# Patient Record
Sex: Female | Born: 1950
Health system: Southern US, Community
[De-identification: ages and names within clinical notes are randomized; demographics above are authoritative.]

## PROBLEM LIST (undated history)

## (undated) DIAGNOSIS — R29898 Other symptoms and signs involving the musculoskeletal system: Secondary | ICD-10-CM

## (undated) DIAGNOSIS — E785 Hyperlipidemia, unspecified: Secondary | ICD-10-CM

## (undated) DIAGNOSIS — F39 Unspecified mood [affective] disorder: Secondary | ICD-10-CM

## (undated) DIAGNOSIS — F0390 Unspecified dementia without behavioral disturbance: Secondary | ICD-10-CM

## (undated) DIAGNOSIS — M8580 Other specified disorders of bone density and structure, unspecified site: Secondary | ICD-10-CM

## (undated) DIAGNOSIS — I639 Cerebral infarction, unspecified: Secondary | ICD-10-CM

## (undated) DIAGNOSIS — I10 Essential (primary) hypertension: Secondary | ICD-10-CM

## (undated) DIAGNOSIS — G4700 Insomnia, unspecified: Secondary | ICD-10-CM

## (undated) DIAGNOSIS — D332 Benign neoplasm of brain, unspecified: Secondary | ICD-10-CM

## (undated) HISTORY — PX: OTHER SURGICAL HISTORY: SHX169

## (undated) HISTORY — DX: Insomnia, unspecified: G47.00

## (undated) HISTORY — DX: Cerebral infarction, unspecified: I63.9

## (undated) HISTORY — DX: Hyperlipidemia, unspecified: E78.5

## (undated) HISTORY — DX: Other symptoms and signs involving the musculoskeletal system: R29.898

## (undated) HISTORY — DX: Unspecified dementia, unspecified severity, without behavioral disturbance, psychotic disturbance, mood disturbance, and anxiety: F03.90

## (undated) HISTORY — DX: Other specified disorders of bone density and structure, unspecified site: M85.80

## (undated) HISTORY — DX: Unspecified mood (affective) disorder: F39

---

## 1985-07-22 HISTORY — PX: MELANOMA EXCISION: SHX5266

## 2014-06-29 DIAGNOSIS — D420 Neoplasm of uncertain behavior of cerebral meninges: Secondary | ICD-10-CM | POA: Insufficient documentation

## 2014-07-14 DIAGNOSIS — I10 Essential (primary) hypertension: Secondary | ICD-10-CM | POA: Insufficient documentation

## 2014-07-14 DIAGNOSIS — H9192 Unspecified hearing loss, left ear: Secondary | ICD-10-CM | POA: Insufficient documentation

## 2016-05-02 DIAGNOSIS — I10 Essential (primary) hypertension: Secondary | ICD-10-CM | POA: Diagnosis not present

## 2016-05-02 DIAGNOSIS — Z1389 Encounter for screening for other disorder: Secondary | ICD-10-CM | POA: Diagnosis not present

## 2016-05-02 DIAGNOSIS — Z6822 Body mass index (BMI) 22.0-22.9, adult: Secondary | ICD-10-CM | POA: Diagnosis not present

## 2016-05-02 DIAGNOSIS — F1010 Alcohol abuse, uncomplicated: Secondary | ICD-10-CM | POA: Diagnosis not present

## 2016-05-02 DIAGNOSIS — R42 Dizziness and giddiness: Secondary | ICD-10-CM | POA: Diagnosis not present

## 2016-07-04 ENCOUNTER — Inpatient Hospital Stay (HOSPITAL_COMMUNITY)
Admission: EM | Admit: 2016-07-04 | Discharge: 2016-07-06 | DRG: 640 | Disposition: A | Discharge status: Home Health | Payer: PPO | Attending: Internal Medicine | Admitting: Internal Medicine

## 2016-07-04 ENCOUNTER — Inpatient Hospital Stay (HOSPITAL_COMMUNITY): Payer: PPO

## 2016-07-04 ENCOUNTER — Encounter (HOSPITAL_COMMUNITY): Payer: Self-pay

## 2016-07-04 ENCOUNTER — Emergency Department (HOSPITAL_COMMUNITY): Payer: PPO

## 2016-07-04 DIAGNOSIS — Z86011 Personal history of benign neoplasm of the brain: Secondary | ICD-10-CM | POA: Diagnosis not present

## 2016-07-04 DIAGNOSIS — E871 Hypo-osmolality and hyponatremia: Principal | ICD-10-CM

## 2016-07-04 DIAGNOSIS — R29818 Other symptoms and signs involving the nervous system: Secondary | ICD-10-CM | POA: Diagnosis not present

## 2016-07-04 DIAGNOSIS — F1020 Alcohol dependence, uncomplicated: Secondary | ICD-10-CM | POA: Diagnosis not present

## 2016-07-04 DIAGNOSIS — E876 Hypokalemia: Secondary | ICD-10-CM | POA: Diagnosis not present

## 2016-07-04 DIAGNOSIS — F10939 Alcohol use, unspecified with withdrawal, unspecified: Secondary | ICD-10-CM

## 2016-07-04 DIAGNOSIS — R410 Disorientation, unspecified: Secondary | ICD-10-CM

## 2016-07-04 DIAGNOSIS — T502X5A Adverse effect of carbonic-anhydrase inhibitors, benzothiadiazides and other diuretics, initial encounter: Secondary | ICD-10-CM | POA: Diagnosis not present

## 2016-07-04 DIAGNOSIS — I10 Essential (primary) hypertension: Secondary | ICD-10-CM | POA: Diagnosis not present

## 2016-07-04 DIAGNOSIS — E232 Diabetes insipidus: Secondary | ICD-10-CM

## 2016-07-04 DIAGNOSIS — F05 Delirium due to known physiological condition: Secondary | ICD-10-CM | POA: Diagnosis not present

## 2016-07-04 DIAGNOSIS — F1010 Alcohol abuse, uncomplicated: Secondary | ICD-10-CM

## 2016-07-04 DIAGNOSIS — E785 Hyperlipidemia, unspecified: Secondary | ICD-10-CM | POA: Diagnosis not present

## 2016-07-04 DIAGNOSIS — R402411 Glasgow coma scale score 13-15, in the field [EMT or ambulance]: Secondary | ICD-10-CM | POA: Diagnosis not present

## 2016-07-04 DIAGNOSIS — G9340 Encephalopathy, unspecified: Secondary | ICD-10-CM

## 2016-07-04 DIAGNOSIS — F17210 Nicotine dependence, cigarettes, uncomplicated: Secondary | ICD-10-CM | POA: Diagnosis not present

## 2016-07-04 DIAGNOSIS — D649 Anemia, unspecified: Secondary | ICD-10-CM | POA: Diagnosis present

## 2016-07-04 DIAGNOSIS — R4182 Altered mental status, unspecified: Secondary | ICD-10-CM | POA: Diagnosis not present

## 2016-07-04 DIAGNOSIS — Z85820 Personal history of malignant melanoma of skin: Secondary | ICD-10-CM | POA: Diagnosis not present

## 2016-07-04 DIAGNOSIS — T50905A Adverse effect of unspecified drugs, medicaments and biological substances, initial encounter: Secondary | ICD-10-CM | POA: Diagnosis not present

## 2016-07-04 DIAGNOSIS — Z6821 Body mass index (BMI) 21.0-21.9, adult: Secondary | ICD-10-CM

## 2016-07-04 DIAGNOSIS — F10239 Alcohol dependence with withdrawal, unspecified: Secondary | ICD-10-CM | POA: Diagnosis present

## 2016-07-04 DIAGNOSIS — Z79899 Other long term (current) drug therapy: Secondary | ICD-10-CM

## 2016-07-04 DIAGNOSIS — E46 Unspecified protein-calorie malnutrition: Secondary | ICD-10-CM | POA: Diagnosis not present

## 2016-07-04 DIAGNOSIS — E1310 Other specified diabetes mellitus with ketoacidosis without coma: Secondary | ICD-10-CM | POA: Diagnosis not present

## 2016-07-04 DIAGNOSIS — F10288 Alcohol dependence with other alcohol-induced disorder: Secondary | ICD-10-CM | POA: Diagnosis not present

## 2016-07-04 HISTORY — DX: Essential (primary) hypertension: I10

## 2016-07-04 HISTORY — DX: Benign neoplasm of brain, unspecified: D33.2

## 2016-07-04 LAB — RAPID URINE DRUG SCREEN, HOSP PERFORMED
Amphetamines: NOT DETECTED
BENZODIAZEPINES: NOT DETECTED
Barbiturates: NOT DETECTED
Cocaine: NOT DETECTED
Opiates: NOT DETECTED
TETRAHYDROCANNABINOL: NOT DETECTED

## 2016-07-04 LAB — DIFFERENTIAL
BASOS ABS: 0.1 10*3/uL (ref 0.0–0.1)
Basophils Relative: 1 %
EOS ABS: 0.1 10*3/uL (ref 0.0–0.7)
Eosinophils Relative: 1 %
LYMPHS PCT: 23 %
Lymphs Abs: 2 10*3/uL (ref 0.7–4.0)
MONOS PCT: 13 %
Monocytes Absolute: 1.1 10*3/uL — ABNORMAL HIGH (ref 0.1–1.0)
NEUTROS ABS: 5.4 10*3/uL (ref 1.7–7.7)
NEUTROS PCT: 62 %

## 2016-07-04 LAB — I-STAT TROPONIN, ED: Troponin i, poc: 0.02 ng/mL (ref 0.00–0.08)

## 2016-07-04 LAB — URINALYSIS, ROUTINE W REFLEX MICROSCOPIC
BILIRUBIN URINE: NEGATIVE
Glucose, UA: NEGATIVE mg/dL
HGB URINE DIPSTICK: NEGATIVE
KETONES UR: 5 mg/dL — AB
Leukocytes, UA: NEGATIVE
Nitrite: NEGATIVE
Protein, ur: NEGATIVE mg/dL
SPECIFIC GRAVITY, URINE: 1.005 (ref 1.005–1.030)
pH: 8 (ref 5.0–8.0)

## 2016-07-04 LAB — PROTIME-INR
INR: 0.94
Prothrombin Time: 12.6 seconds (ref 11.4–15.2)

## 2016-07-04 LAB — CBG MONITORING, ED: Glucose-Capillary: 105 mg/dL — ABNORMAL HIGH (ref 65–99)

## 2016-07-04 LAB — I-STAT CHEM 8, ED
BUN: 5 mg/dL — AB (ref 6–20)
CHLORIDE: 74 mmol/L — AB (ref 101–111)
CREATININE: 0.6 mg/dL (ref 0.44–1.00)
Calcium, Ion: 0.99 mmol/L — ABNORMAL LOW (ref 1.15–1.40)
Glucose, Bld: 98 mg/dL (ref 65–99)
HCT: 37 % (ref 36.0–46.0)
Hemoglobin: 12.6 g/dL (ref 12.0–15.0)
Potassium: 2.5 mmol/L — CL (ref 3.5–5.1)
Sodium: 115 mmol/L — CL (ref 135–145)
TCO2: 27 mmol/L (ref 0–100)

## 2016-07-04 LAB — MRSA PCR SCREENING: MRSA by PCR: NEGATIVE

## 2016-07-04 LAB — BASIC METABOLIC PANEL
ANION GAP: 12 (ref 5–15)
ANION GAP: 14 (ref 5–15)
BUN: <5 mg/dL — ABNORMAL LOW (ref 6–20)
BUN: <5 mg/dL — ABNORMAL LOW (ref 6–20)
CALCIUM: 8.7 mg/dL — AB (ref 8.9–10.3)
CHLORIDE: 81 mmol/L — AB (ref 101–111)
CO2: 29 mmol/L (ref 22–32)
CO2: 24 mmol/L (ref 22–32)
Calcium: 8.8 mg/dL — ABNORMAL LOW (ref 8.9–10.3)
Chloride: 83 mmol/L — ABNORMAL LOW (ref 101–111)
Creatinine, Ser: 0.6 mg/dL (ref 0.44–1.00)
Creatinine, Ser: 0.59 mg/dL (ref 0.44–1.00)
GFR calc non Af Amer: >60 mL/min (ref >60)
Glucose, Bld: 89 mg/dL (ref 65–99)
Glucose, Bld: 89 mg/dL (ref 65–99)
POTASSIUM: 2.2 mmol/L — AB (ref 3.5–5.1)
POTASSIUM: 2.6 mmol/L — AB (ref 3.5–5.1)
Sodium: 122 mmol/L — ABNORMAL LOW (ref 135–145)
Sodium: 121 mmol/L — ABNORMAL LOW (ref 135–145)

## 2016-07-04 LAB — CBC
HCT: 32.2 % — ABNORMAL LOW (ref 36.0–46.0)
HEMOGLOBIN: 11.8 g/dL — AB (ref 12.0–15.0)
MCH: 31.9 pg (ref 26.0–34.0)
MCHC: 36.6 g/dL — ABNORMAL HIGH (ref 30.0–36.0)
MCV: 87 fL (ref 78.0–100.0)
Platelets: 306 10*3/uL (ref 150–400)
RBC: 3.7 MIL/uL — AB (ref 3.87–5.11)
RDW: 13.8 % (ref 11.5–15.5)
WBC: 8.7 10*3/uL (ref 4.0–10.5)

## 2016-07-04 LAB — COMPREHENSIVE METABOLIC PANEL
ALBUMIN: 3.7 g/dL (ref 3.5–5.0)
ALK PHOS: 115 U/L (ref 38–126)
ALT: 18 U/L (ref 14–54)
AST: 54 U/L — AB (ref 15–41)
Anion gap: 17 — ABNORMAL HIGH (ref 5–15)
BUN: 6 mg/dL (ref 6–20)
CALCIUM: 9.2 mg/dL (ref 8.9–10.3)
CHLORIDE: 74 mmol/L — AB (ref 101–111)
CO2: 25 mmol/L (ref 22–32)
CREATININE: 0.71 mg/dL (ref 0.44–1.00)
GFR calc Af Amer: >60 mL/min (ref >60)
GFR calc non Af Amer: >60 mL/min (ref >60)
GLUCOSE: 100 mg/dL — AB (ref 65–99)
Potassium: 2.5 mmol/L — CL (ref 3.5–5.1)
SODIUM: 116 mmol/L — AB (ref 135–145)
Total Bilirubin: 1.4 mg/dL — ABNORMAL HIGH (ref 0.3–1.2)
Total Protein: 6.8 g/dL (ref 6.5–8.1)

## 2016-07-04 LAB — PHOSPHORUS: Phosphorus: 3.2 mg/dL (ref 2.5–4.6)

## 2016-07-04 LAB — APTT: aPTT: 33 seconds (ref 24–36)

## 2016-07-04 LAB — MAGNESIUM: Magnesium: 1.5 mg/dL — ABNORMAL LOW (ref 1.7–2.4)

## 2016-07-04 LAB — OSMOLALITY: Osmolality: 250 mOsm/kg — ABNORMAL LOW (ref 275–295)

## 2016-07-04 LAB — ETHANOL: Alcohol, Ethyl (B): <5 mg/dL (ref <5)

## 2016-07-04 LAB — TSH: TSH: 0.571 u[IU]/mL (ref 0.350–4.500)

## 2016-07-04 MED ORDER — ADULT MULTIVITAMIN W/MINERALS CH
1.0000 | ORAL_TABLET | Freq: Every day | ORAL | Status: DC
  Administered 2016-07-04 – 2016-07-06 (×3): 1 via ORAL
  Filled 2016-07-04 (×3): qty 1

## 2016-07-04 MED ORDER — ENOXAPARIN SODIUM 40 MG/0.4ML ~~LOC~~ SOLN
40.0000 mg | SUBCUTANEOUS | Status: DC
  Administered 2016-07-04: 40 mg via SUBCUTANEOUS
  Filled 2016-07-04: qty 0.4

## 2016-07-04 MED ORDER — LORAZEPAM 2 MG/ML IJ SOLN: 2.0000 mg | INTRAMUSCULAR | Status: DC | PRN

## 2016-07-04 MED ORDER — POTASSIUM CHLORIDE CRYS ER 20 MEQ PO TBCR
40.0000 meq | EXTENDED_RELEASE_TABLET | Freq: Once | ORAL | Status: AC
  Administered 2016-07-04: 40 meq via ORAL
  Filled 2016-07-04: qty 2

## 2016-07-04 MED ORDER — GADOBENATE DIMEGLUMINE 529 MG/ML IV SOLN
15.0000 mL | Freq: Once | INTRAVENOUS | Status: AC
  Administered 2016-07-04: 12 mL via INTRAVENOUS

## 2016-07-04 MED ORDER — SODIUM CHLORIDE 0.9 % IV SOLN
Freq: Once | INTRAVENOUS | Status: DC
  Filled 2016-07-04: qty 1000

## 2016-07-04 MED ORDER — POTASSIUM CHLORIDE CRYS ER 20 MEQ PO TBCR
40.0000 meq | EXTENDED_RELEASE_TABLET | Freq: Once | ORAL | Status: AC
  Administered 2016-07-05: 40 meq via ORAL
  Filled 2016-07-04: qty 2

## 2016-07-04 MED ORDER — MAGNESIUM SULFATE 2 GM/50ML IV SOLN
2.0000 g | Freq: Once | INTRAVENOUS | Status: AC
  Administered 2016-07-04: 2 g via INTRAVENOUS
  Filled 2016-07-04: qty 50

## 2016-07-04 MED ORDER — SODIUM CHLORIDE 0.9 % IV BOLUS (SEPSIS)
1000.0000 mL | Freq: Once | INTRAVENOUS | Status: AC
  Administered 2016-07-04: 1000 mL via INTRAVENOUS

## 2016-07-04 MED ORDER — VITAMIN B-1 100 MG PO TABS
100.0000 mg | ORAL_TABLET | Freq: Every day | ORAL | Status: DC
Start: 2016-07-04 — End: 2016-07-06
  Administered 2016-07-04 – 2016-07-06 (×3): 100 mg via ORAL
  Filled 2016-07-04: qty 1

## 2016-07-04 MED ORDER — FOLIC ACID 1 MG PO TABS
1.0000 mg | ORAL_TABLET | Freq: Every day | ORAL | Status: DC
  Administered 2016-07-04 – 2016-07-06 (×3): 1 mg via ORAL
  Filled 2016-07-04 (×3): qty 1

## 2016-07-04 MED ORDER — LORAZEPAM 2 MG/ML IJ SOLN
0.0000 mg | Freq: Four times a day (QID) | INTRAMUSCULAR | Status: AC
  Administered 2016-07-04: 1 mg via INTRAVENOUS
  Administered 2016-07-05: 2 mg via INTRAVENOUS
  Filled 2016-07-04 (×2): qty 1

## 2016-07-04 MED ORDER — LORAZEPAM 2 MG/ML IJ SOLN: 0.0000 mg | Freq: Two times a day (BID) | INTRAMUSCULAR | Status: DC

## 2016-07-04 MED ORDER — SODIUM CHLORIDE 0.9% FLUSH: 3.0000 mL | INTRAVENOUS | Status: DC | PRN

## 2016-07-04 MED ORDER — SODIUM CHLORIDE 0.9 % IV SOLN
30.0000 meq | Freq: Once | INTRAVENOUS | Status: AC
  Administered 2016-07-04: 30 meq via INTRAVENOUS
  Filled 2016-07-04: qty 15

## 2016-07-04 MED ORDER — SODIUM CHLORIDE 0.9% FLUSH
3.0000 mL | Freq: Two times a day (BID) | INTRAVENOUS | Status: DC
  Administered 2016-07-05 (×2): 3 mL via INTRAVENOUS

## 2016-07-04 MED ORDER — VITAMIN B-1 100 MG PO TABS
100.0000 mg | ORAL_TABLET | Freq: Every day | ORAL | Status: DC
  Filled 2016-07-04 (×2): qty 1

## 2016-07-04 MED ORDER — SODIUM CHLORIDE 0.9% FLUSH
3.0000 mL | Freq: Two times a day (BID) | INTRAVENOUS | Status: DC
  Administered 2016-07-05: 3 mL via INTRAVENOUS

## 2016-07-04 MED ORDER — SODIUM CHLORIDE 0.9 % IV SOLN: 250.0000 mL | INTRAVENOUS | Status: DC | PRN

## 2016-07-04 MED ORDER — SODIUM CHLORIDE 0.9 % IV SOLN
30.0000 meq | Freq: Once | INTRAVENOUS | Status: AC
  Administered 2016-07-05: 30 meq via INTRAVENOUS
  Filled 2016-07-04: qty 15

## 2016-07-04 MED ORDER — THIAMINE HCL 100 MG/ML IJ SOLN
100.0000 mg | Freq: Every day | INTRAMUSCULAR | Status: DC
  Administered 2016-07-04: 100 mg via INTRAVENOUS
  Filled 2016-07-04: qty 2

## 2016-07-04 NOTE — Progress Notes (Addendum)
Last BMET resulted with Na 121, K 2.6. Per RN, IV K 30 mEq is 1/2 empty and still running. We will go ahead and supplement with oral 40 mEq and another run of IV 30 mEq. Of note, she has been intermittently in sinus bradycardia, HR 30s, without any distress, which we suspect is from her degree of hypokalemia. With correction, we hope this will resolve.  She received 2g of Mg IV for Mg 1.5. We will recheck this as well to ensure it is repleted as it can also affect our ability to correct her hypokalemia.  ADDENDUM 07/05/2016  12:48 AM:  Na remains stable at 121 as does K 2.6. Per review of meds, it appears the supplement noted above was not administered until about a few minutes ago. Mg elevated at 2.7. We anticipate K will improve on the next BMET.  Of note, she has an increasing anion gap from 12 to 16 over the last 3 BMETs with a drop in bicarbonate from 29 to 22. Unclear of the etiology for this at the time. EKG reviewed and noted for normal sinus rhythm, normal axis, absence of U waves though abnormal QTc 466.

## 2016-07-04 NOTE — Consult Note (Signed)
PULMONARY / CRITICAL CARE MEDICINE   Name: Kortnei Basden MRN: [id number] DOB: [birth year]    ADMISSION DATE:  07/04/2016 CONSULTATION DATE:  12/14 REFERRING MD:  Eppie Gibson   CHIEF COMPLAINT:  Hyponatremia and encephalopathy   HISTORY OF PRESENT ILLNESS:   65 year old white female w/ significant h/o HTN, prior crani for tumor resection 2 years ago, daily ETOH abuse in excess of 20 yrs and 3 ppd smoker. Per husband has been "declining" over the last month. Acutely on the am husband noted her to be confused and not responding to questions. At that point shwe was unable to stand and kept repeating phrases so he called EMS. On arrival CODE STROKE was called. A STAT CT head was obtained: there was no evidence of acute infarct. An EEG was obtained which showed marked artifact but no evidence of seizure. Admitting labs were drawn which showed sodium of 115. PCCM asked to see her in consult due to her severe hyponatremia. On our arrival she was awake, alert, tremulous but oriented.  Note: She received ativan prior to our eval.  PAST MEDICAL HISTORY :  She  has a past medical history of Brain tumor (benign) ([place]) and Hypertension.  PAST SURGICAL HISTORY: She  has a past surgical history that includes Brain tumor removal.  No Known Allergies  MEDICATIONS hctz 25 mg daily, folic acid 400mg  daily, norvasc 10mg  daily.   FAMILY HISTORY:  Her has no family status information on file.    SOCIAL HISTORY: She  reports that she has been smoking Cigarettes.  She has been smoking about 3.00 packs per day. She has never used smokeless tobacco. She reports that she drinks alcohol. She reports that she does not use drugs.  REVIEW OF SYSTEMS:   Bolds are positive  Constitutional: weight loss, gain, night sweats, Fevers, chills, fatigue .  HEENT: headaches, Sore throat, sneezing, nasal congestion, post nasal drip, Difficulty swallowing, Tooth/dental problems, visual complaints visual changes, ear ache CV:   chest pain, radiates: ,Orthopnea, PND, swelling in lower extremities, dizziness, palpitations, syncope.  GI  heartburn, indigestion, abdominal pain, nausea, vomiting, diarrhea, change in bowel habits, loss of appetite, bloody stools.  Resp: cough, productive: , hemoptysis, dyspnea, chest pain, pleuritic.  Skin: rash or itching or icterus GU: dysuria, change in color of urine, urgency or frequency. flank pain, hematuria  MS: joint pain or swelling. decreased range of motion  Psych: change in mood or affect. depression or anxiety.  Neuro: difficulty with speech, weakness, numbness, ataxia   SUBJECTIVE:  Confused as to why she is here   VITAL SIGNS: BP 101/65   Pulse 67   Temp 97.9 F (36.6 C) (Oral)   Resp 20   Ht 5\' 4"  (1.626 m)   Wt 127 lb 10.3 oz (57.9 kg)   SpO2 100%   BMI 21.91 kg/m   HEMODYNAMICS:    VENTILATOR SETTINGS:    INTAKE / OUTPUT: No intake/output data recorded.  PHYSICAL EXAMINATION: General:  Frail and tremulous white female. Not in acute distress  Neuro:  Awake, hard of hearing. Currently oriented x4. Moves all extremities  HEENT:  NCAT, no JVD MMM Cardiovascular:  RRR w/out MRG Lungs:  Clear and w/out accessory use  Abdomen:  Soft, not tender + bowel sounds  Musculoskeletal:  Equal st and bulk  Skin:  Warm and dry   LABS:  BMET  Recent Labs Lab 07/04/16 1147 07/04/16 1155  NA 116* 115*  K 2.5* 2.5*  CL 74* 74*  CO2  25  --   BUN 6 5*  CREATININE 0.71 0.60  GLUCOSE 100* 98    Electrolytes  Recent Labs Lab 07/04/16 1147 07/04/16 1335  CALCIUM 9.2  --   MG  --  1.5*  PHOS  --  3.2    CBC  Recent Labs Lab 07/04/16 1147 07/04/16 1155  WBC 8.7  --   HGB 11.8* 12.6  HCT 32.2* 37.0  PLT 306  --     Coag's  Recent Labs Lab 07/04/16 1147  APTT 33  INR 0.94    Sepsis Markers No results for input(s): LATICACIDVEN, PROCALCITON, O2SATVEN in the last 168 hours.  ABG No results for input(s): PHART, PCO2ART, PO2ART in the  last 168 hours.  Liver Enzymes  Recent Labs Lab 07/04/16 1147  AST 54*  ALT 18  ALKPHOS 115  BILITOT 1.4*  ALBUMIN 3.7    Cardiac Enzymes No results for input(s): TROPONINI, PROBNP in the last 168 hours.  Glucose  Recent Labs Lab 07/04/16 1141  GLUCAP 105*    Imaging Ct Head Code Stroke W/o Cm  Result Date: 07/04/2016 CLINICAL DATA:  Code stroke. 65 year old female with altered mental status. Confusion and weakness. Initial encounter. EXAM: CT HEAD WITHOUT CONTRAST TECHNIQUE: Contiguous axial images were obtained from the base of the skull through the vertex without intravenous contrast. COMPARISON:  [facility] head CT 07/31/2014 and earlier. FINDINGS: Brain: Bifrontal encephalomalacia appears stable since 2016 and is related to prior resection of a large anterior meningioma. Anterior bifrontal postoperative extra-axial hematoma appears largely resolved, small residual extra-axial fluid collection without significant mass effect. No cortically based acute infarct identified. No acute intracranial hemorrhage identified. No new intracranial mass or mass effect identified. No ventriculomegaly. Vascular: Calcified atherosclerosis at the skull base. No suspicious intracranial vascular hyperdensity. Skull: Sequelae of bifrontal craniotomy re- demonstrated. No acute osseous abnormality identified. Sinuses/Orbits: New subtotal opacification of the right maxillary sinus and anterior ethmoids. Other Visualized paranasal sinuses and mastoids are stable and well pneumatized. Other: No acute orbit or scalp soft tissue findings. ASPECTS [facility] Stroke Program Early CT Score) - Ganglionic level infarction (caudate, lentiform nuclei, internal capsule, insula, M1-M3 cortex): 7 - Supraganglionic infarction (M4-M6 cortex): 3 Total score (0-10 with 10 being normal): 10 IMPRESSION: 1. No acute cortically based infarct or acute intracranial hemorrhage identified. 2. ASPECTS is 10. 3. Sequelae of  previous large anterior meningioma resection with bifrontal encephalomalacia. 4. The above was relayed via text pager to Dr. Dorian Pod on 07/04/2016 at 11:57 . Electronically Signed   By: Genevie Ann M.D.   On: 07/04/2016 11:57     STUDIES:    CULTURES:   ANTIBIOTICS:   SIGNIFICANT EVENTS: EEG 12/14: no clear seizure   LINES/TUBES:   DISCUSSION: 65 year old female w/ sig h/o ETOH abuse. Presents w/ encephalopathy and hyponatremia (Na 116). Not currently seizing. She is hemodynamically stable. Would admit to the SDU setting. F/u pending urine studies and slowly correct Na (goal 4-6 meq over 24hrs). Given her ETOH history things may get worse before they get better as she is at high risk for ETOH withdrawal. Would continue CIWA protocol.   ASSESSMENT / PLAN:  NEUROLOGIC A:   Acute encephalopathy-->seems to have improved. In setting of hyponatremia. Also consider  ? Withdrawal ? Isolated seizure?  P:   CIWA protocol  Slow correction of Na Admit to the SDU setting   PULMONARY A: No acute but is aspiration risk P:   NPO for now  CARDIOVASCULAR A:  No acute P:  Tele  Gentle IVFs  RENAL A:   Severe hyponatremia -->she is on HCTZ  Hypokalemia  P:   Ck urine Na and Urine Osmo, and serum osm  Dc HCTZ  Assess volume  May need to consider 3% saline and desmopressin Goal Na improvement 4 to 6 meq over 24 hrs  Replace K  Replace Mg  GASTROINTESTINAL A:   Protein calorie malnutrition  P:   NPO for now Assess for PO intake Aspiration precautions Would benefit for RD consult   HEMATOLOGIC A:   Anemia  P:  [place] heparin  Trend CBC   INFECTIOUS A:   No evidence of infection  P:   Trend fever and WBC curve  ENDOCRINE A:   No issues P:   Assess am glucose on chemistry    FAMILY  - Updates by Ambia ACNP-BC [place] Pager # [phone] OR # [phone] if no answer\ 07/04/2016, 3:39 PM  Attending Note:  65 year  old female with etoh abuse history presenting with AMS and hyponatremia.  On exam, she is tremulous but oriented to self, place and time.  I reviewed CXR myself, no acute disease.  Discussed with resident and PCCM-NP.  Hyponatremia: alcohol and HCTZ induced.  - NS for volume resuscitation and low Na.  - BMET in AM  - Check phos.  - Replace electrolytes as indicated.  AMS: due to etoh withdrawal and hyponatremia  - Ativan  - Address Na as above  Etoh abuse  - Watch for DT  - CIWA protocol  - Thiamine/folate  - Ativan per CIWA  Admit to the SDU, watch closely for DT's.  Ativan to avoid that.  Monitor BMET q2 hours.  Do not allow for significant rise as I anticipate this is a chronic process.  PCCM will re-evaluate in AM.  Patient seen and examined, agree with above note.  I dictated the care and orders written for this patient under my direction.  Rush Farmer, M.D. [facility] Pulmonary/Critical Care Medicine. Pager: [phone]. After hours pager: [phone].

## 2016-07-04 NOTE — ED Triage Notes (Signed)
Per [place], Pt is coming from home where she was with her husband. Husband reports she was sitting at the kitchen table when her head slumped down and she became confused. Family reports patient was unable to ambulate by herself, kept repeating phrases, and was only oriented x1 to person after the episode. Denied being unresponsive. Pt has Hx of Brain Tumor. Vitals per EMS: 150 Palpable, 80 HR, 28 RR, 98% on 4L.

## 2016-07-04 NOTE — Consult Note (Signed)
Referring Physician: ED    Chief Complaint: code stroke, acute confusion  HPI:                                                                                                                                         Kristy Cabrera is an 65 y.o. female with a past medical history that is relevant for HTNM HLD, ETOH abuse, smoking, s/p brain tumor resection 2 years ago, brought in by EMS due to altered mental status. Husband is at the bedside and said that patient has been declining lately and " acting the same way as she did when the brain tumor was discovered". Said that patient was making coffee this morning like she usually does but around 9:30 he noticed that she was acting weird, no responding to questions appropriately and basically acting confused. He denies noticing focal weakness or speech changes but said that she was not able to stand and walk to the bathroom. Upon arrival to ED noted to have NIHSS 2 and STAT CT head showed no acute abnormality with ASPECTS 10. Of importance, husband reports that Mrs Current drinks heavily on a daily basis " but she doesn't start drinking until late afternoon".  Labs are significant for Na 115, K 2.5. ETOH level and UDS pending.  Date last known well: 07/05/15 Time last known well: 9:30 am  tPA Given: no, NIHSS 2 mainly confusion NIHSS: 2 MRS: 0  Past Medical History:  Diagnosis Date  . Brain tumor (benign) ([place])   . Hypertension     Past Surgical History:  Procedure Laterality Date  . Brain tumor removal      No family history on file. Social History:  reports that she has been smoking Cigarettes.  She has been smoking about 3.00 packs per day. She has never used smokeless tobacco. She reports that she drinks alcohol. She reports that she does not use drugs.  Allergies: No Known Allergies  Medications:                                                                                                                           I have  reviewed the patient's current medications.  ROS:   UNABLE TO OBTAIN DUE TO ACUTE CONFUSIONAL STATE  History obtained from husband and EMS  Physical exam:  Constitutional: well developed, confused. Blood pressure 130/75, pulse 64, temperature 97.4 F (36.3 C), temperature source Oral, resp. rate 24, height 5\' 4"  (1.626 m), weight 57.9 kg (127 lb 10.3 oz), SpO2 100 %. Eyes: no jaundice or exophthalmos.  Head: normocephalic. Neck: supple, no bruits, no JVD. Cardiac: no murmurs. Lungs: clear. Abdomen: soft, no tender, no mass. Extremities: no edema, clubbing, or cyanosis.  Skin: no rash  Neurologic Examination:                                                                                                      General: NAD Mental Status: Alert, awake, confused answering some questions but can follow commands and is not dysarthric or showing signs of expressive aphasia. Cranial Nerves: II:  Visual fields grossly normal, pupils equal, round, reactive to light and accommodation III,IV, VI: ptosis not present, extra-ocular motions intact bilaterally V,VII: smile symmetric, facial light touch sensation normal bilaterally VIII: hearing normal bilaterally IX,X: uvula rises symmetrically XI: bilateral shoulder shrug XII: midline tongue extension without atrophy or fasciculations Motor: Right : Upper extremity   5/5    Left:     Upper extremity   5/5  Lower extremity   5/5     Lower extremity   5/5 Tone and bulk:normal tone throughout; no atrophy noted Sensory: Pinprick and light touch intact throughout, bilaterally Deep Tendon Reflexes:  Right: Upper Extremity   Left: Upper extremity   biceps (C-5 to C-6) 2/4   biceps (C-5 to C-6) 2/4 tricep (C7) 2/4    triceps (C7) 2/4 Brachioradialis (C6) 2/4  Brachioradialis (C6) 2/4  Lower Extremity Lower Extremity   quadriceps (L-2 to L-4) 2/4   quadriceps (L-2 to L-4) 2/4 Achilles (S1) 2/4   Achilles (S1) 2/4 Plantars: Right: downgoing   Left: downgoing Cerebellar: normal finger-to-nose,  normal heel-to-shin test Gait:  No tested due to mental status.     Results for orders placed or performed during the hospital encounter of 07/04/16 (from the past 48 hour(s))  CBG monitoring, ED     Status: Abnormal   Collection Time: 07/04/16 11:41 AM  Result Value Ref Range   Glucose-Capillary 105 (H) 65 - 99 mg/dL  Protime-INR     Status: None   Collection Time: 07/04/16 11:47 AM  Result Value Ref Range   Prothrombin Time 12.6 11.4 - 15.2 seconds   INR 0.94   APTT     Status: None   Collection Time: 07/04/16 11:47 AM  Result Value Ref Range   aPTT 33 24 - 36 seconds  I-stat troponin, ED (not at [facility], [facility])     Status: None   Collection Time: 07/04/16 11:49 AM  Result Value Ref Range   Troponin i, poc 0.02 0.00 - 0.08 ng/mL   Comment 3            Comment: Due to the release kinetics of cTnI, a negative result within the first hours of the onset of symptoms does not rule out myocardial infarction with certainty. If myocardial  infarction is still suspected, repeat the test at appropriate intervals.   I-Stat Chem 8, ED  (not at [facility], [facility])     Status: Abnormal   Collection Time: 07/04/16 11:55 AM  Result Value Ref Range   Sodium 115 (LL) 135 - 145 mmol/L   Potassium 2.5 (LL) 3.5 - 5.1 mmol/L   Chloride 74 (L) 101 - 111 mmol/L   BUN 5 (L) 6 - 20 mg/dL   Creatinine, Ser 0.60 0.44 - 1.00 mg/dL   Glucose, Bld 98 65 - 99 mg/dL   Calcium, Ion 0.99 (L) 1.15 - 1.40 mmol/L   TCO2 27 0 - 100 mmol/L   Hemoglobin 12.6 12.0 - 15.0 g/dL   HCT 37.0 36.0 - 46.0 %   Comment NOTIFIED PHYSICIAN    Ct Head Code Stroke W/o Cm  Result Date: 07/04/2016 CLINICAL DATA:  Code stroke. 65 year old female with altered mental status. Confusion and weakness. Initial encounter. EXAM: CT HEAD WITHOUT CONTRAST TECHNIQUE:  Contiguous axial images were obtained from the base of the skull through the vertex without intravenous contrast. COMPARISON:  [facility] head CT 07/31/2014 and earlier. FINDINGS: Brain: Bifrontal encephalomalacia appears stable since 2016 and is related to prior resection of a large anterior meningioma. Anterior bifrontal postoperative extra-axial hematoma appears largely resolved, small residual extra-axial fluid collection without significant mass effect. No cortically based acute infarct identified. No acute intracranial hemorrhage identified. No new intracranial mass or mass effect identified. No ventriculomegaly. Vascular: Calcified atherosclerosis at the skull base. No suspicious intracranial vascular hyperdensity. Skull: Sequelae of bifrontal craniotomy re- demonstrated. No acute osseous abnormality identified. Sinuses/Orbits: New subtotal opacification of the right maxillary sinus and anterior ethmoids. Other Visualized paranasal sinuses and mastoids are stable and well pneumatized. Other: No acute orbit or scalp soft tissue findings. ASPECTS [facility] Stroke Program Early CT Score) - Ganglionic level infarction (caudate, lentiform nuclei, internal capsule, insula, M1-M3 cortex): 7 - Supraganglionic infarction (M4-M6 cortex): 3 Total score (0-10 with 10 being normal): 10 IMPRESSION: 1. No acute cortically based infarct or acute intracranial hemorrhage identified. 2. ASPECTS is 10. 3. Sequelae of previous large anterior meningioma resection with bifrontal encephalomalacia. 4. The above was relayed via text pager to Dr. Dorian Pod on 07/04/2016 at 11:57 . Electronically Signed   By: Genevie Ann M.D.   On: 07/04/2016 11:57    Assessment: 65 y.o. female with acute confusional state, already improving in the ED. NIHSS 2 mainly due to confusion but certainly has no lateralizing neurological signs on exam. CT head was independently reviewed and showed no acute abnormality. Of note, Na 115. Although can  not exclude a shower of small cortical infarcts, patient has NIHSS 2 mainly secondary to confusion and thus doesn't seem to be a suitable candidate for iv thrombolysis. Further, her presentation is not quite suggestive of large vessel occlusion. She has hyponatremia with Na 115 which may also be a potential etiology for her presentation. Prior brain surgery with evident areas of encephalomalacia and therefore focal seizure with impairment of awareness also to be considered. Recommend: Admit. MRI/MRA brain with further stroke work up if MRI +. EEG. Slow correction hyponatremia as per primary team. Will continue to follow.    Stroke Risk Factors - hyperlipidemia, hypertension and smoking   Dorian Pod, MD [facility] 07/04/2016

## 2016-07-04 NOTE — Consult Note (Signed)
65 year old white female w/ significant h/o HTN, prior craniotomy at [facility] 07/21/14 for a meningioma s/p resection, daily ETOH(vodka and water) and 3 ppd smoker. Reportedly, this am husband noted her to be confused and not responding appropriately to questions. She kept repeating phrases so he called EMS. She was found to be hyponatremic with a sNa of 1106mq/l.  She takes HCTZ daily for HBP. Of note, there is a history of hyponatremia. sNa was 127 on 07/23/14.  CCM has been involved and they began IV NS and sodium has risen to 122.  Mag is 1.5, K 2.2.  Past Medical History:  Diagnosis Date  . Brain tumor (benign) ([place]   . Hypertension    Past Surgical History:  Procedure Laterality Date  . Brain tumor removal     Social History:  reports that she has been smoking Cigarettes.  She has been smoking about 3.00 packs per day. She has never used smokeless tobacco. She reports that she drinks alcohol. She reports that she does not use drugs. Allergies: No Known Allergies History reviewed. No pertinent family history.  Medications:  Prior to Admission:  Prescriptions Prior to Admission  Medication Sig Dispense Refill Last Dose  . amLODipine (NORVASC) 10 MG tablet Take 10 mg by mouth daily.   07/04/2016 at Unknown time  . folic acid (FOLVITE) 4350MCG tablet Take 400 mcg by mouth daily.   07/04/2016 at Unknown time  . hydrochlorothiazide (HYDRODIURIL) 25 MG tablet Take 25 mg by mouth daily.   07/04/2016 at Unknown time  . simvastatin (ZOCOR) 20 MG tablet Take 20 mg by mouth daily.   07/04/2016 at Unknown time   Scheduled: . enoxaparin (LOVENOX) injection  40 mg Subcutaneous Q24H  . folic acid  1 mg Oral Daily  . LORazepam  0-4 mg Intravenous Q6H   Followed by  . [START ON 07/06/2016] LORazepam  0-4 mg Intravenous Q12H  . multivitamin with minerals  1 tablet Oral Daily  . potassium chloride (KCL MULTIRUN) 30 mEq in 265 mL IVPB  30 mEq Intravenous Once  . sodium chloride flush  3 mL Intravenous  Q12H  . sodium chloride flush  3 mL Intravenous Q12H  . thiamine  100 mg Oral Daily   Or  . thiamine  100 mg Intravenous Daily  . thiamine  100 mg Oral Daily     ROS: as per HPI Blood pressure (!) 99/43, pulse 65, temperature 98.1 F (36.7 C), temperature source Axillary, resp. rate (!) 30, height _0  (1.626 m), weight 57.9 kg (127 lb 10.3 oz), SpO2 99 %.  General appearance: slowed mentation Head: Normocephalic, without obvious abnormality, atraumatic Eyes: negative Ears: HOH Nose: Nares normal. Septum midline. Mucosa normal. No drainage or sinus tenderness. Throat: lips, mucosa, and tongue normal; teeth and gums normal Resp: clear to auscultation bilaterally Chest wall: no tenderness Cardio: regular rate and rhythm, S1, S2 normal, no murmur, click, rub or gallop GI: soft, non-tender; bowel sounds normal; no masses,  no organomegaly Extremities: extremities normal, atraumatic, no cyanosis or edema Skin: Skin color, texture, turgor normal. No rashes or lesions Neurologic: Grossly normal lethargic and sleepy Results for orders placed or performed during the hospital encounter of 07/04/16 (from the past 48 hour(s))  CBG monitoring, ED     Status: Abnormal   Collection Time: 07/04/16 11:41 AM  Result Value Ref Range   Glucose-Capillary 105 (H) 65 - 99 mg/dL  Ethanol     Status: None   Collection Time: 07/04/16  11:47 AM  Result Value Ref Range   Alcohol, Ethyl (B) <5 <5 mg/dL    Comment:        LOWEST DETECTABLE LIMIT FOR SERUM ALCOHOL IS 5 mg/dL FOR MEDICAL PURPOSES ONLY   Protime-INR     Status: None   Collection Time: 07/04/16 11:47 AM  Result Value Ref Range   Prothrombin Time 12.6 11.4 - 15.2 seconds   INR 0.94   APTT     Status: None   Collection Time: 07/04/16 11:47 AM  Result Value Ref Range   aPTT 33 24 - 36 seconds  CBC     Status: Abnormal   Collection Time: 07/04/16 11:47 AM  Result Value Ref Range   WBC 8.7 4.0 - 10.5 K/uL   RBC 3.70 (L) 3.87 - 5.11  MIL/uL   Hemoglobin 11.8 (L) 12.0 - 15.0 g/dL   HCT 32.2 (L) 36.0 - 46.0 %   MCV 87.0 78.0 - 100.0 fL   MCH 31.9 26.0 - 34.0 pg   MCHC 36.6 (H) 30.0 - 36.0 g/dL   RDW 13.8 11.5 - 15.5 %   Platelets 306 150 - 400 K/uL  Differential     Status: Abnormal   Collection Time: 07/04/16 11:47 AM  Result Value Ref Range   Neutrophils Relative % 62 %   Lymphocytes Relative 23 %   Monocytes Relative 13 %   Eosinophils Relative 1 %   Basophils Relative 1 %   Neutro Abs 5.4 1.7 - 7.7 K/uL   Lymphs Abs 2.0 0.7 - 4.0 K/uL   Monocytes Absolute 1.1 (H) 0.1 - 1.0 K/uL   Eosinophils Absolute 0.1 0.0 - 0.7 K/uL   Basophils Absolute 0.1 0.0 - 0.1 K/uL   WBC Morphology ATYPICAL LYMPHOCYTES   Comprehensive metabolic panel     Status: Abnormal   Collection Time: 07/04/16 11:47 AM  Result Value Ref Range   Sodium 116 (LL) 135 - 145 mmol/L    Comment: CRITICAL RESULT CALLED TO, READ BACK BY AND VERIFIED WITH: STEELE,H RN @ 1235 07/04/16 LEONARD,A    Potassium 2.5 (LL) 3.5 - 5.1 mmol/L    Comment: CRITICAL RESULT CALLED TO, READ BACK BY AND VERIFIED WITH: STEELE,H RN @ 1235 07/04/16 LEONARD,A    Chloride 74 (L) 101 - 111 mmol/L   CO2 25 22 - 32 mmol/L   Glucose, Bld 100 (H) 65 - 99 mg/dL   BUN 6 6 - 20 mg/dL   Creatinine, Ser 0.71 0.44 - 1.00 mg/dL   Calcium 9.2 8.9 - 10.3 mg/dL   Total Protein 6.8 6.5 - 8.1 g/dL   Albumin 3.7 3.5 - 5.0 g/dL   AST 54 (H) 15 - 41 U/L   ALT 18 14 - 54 U/L   Alkaline Phosphatase 115 38 - 126 U/L   Total Bilirubin 1.4 (H) 0.3 - 1.2 mg/dL   GFR calc non Af Amer >60 >60 mL/min   GFR calc Af Amer >60 >60 mL/min    Comment: (NOTE) The eGFR has been calculated using the CKD EPI equation. This calculation has not been validated in all clinical situations. eGFR's persistently <60 mL/min signify possible Chronic Kidney Disease.    Anion gap 17 (H) 5 - 15  I-stat troponin, ED (not at [facility], [facility])     Status: None   Collection Time: 07/04/16 11:49 AM  Result Value Ref  Range   Troponin i, poc 0.02 0.00 - 0.08 ng/mL   Comment 3  Comment: Due to the release kinetics of cTnI, a negative result within the first hours of the onset of symptoms does not rule out myocardial infarction with certainty. If myocardial infarction is still suspected, repeat the test at appropriate intervals.   I-Stat Chem 8, ED  (not at [facility], [facility])     Status: Abnormal   Collection Time: 07/04/16 11:55 AM  Result Value Ref Range   Sodium 115 (LL) 135 - 145 mmol/L   Potassium 2.5 (LL) 3.5 - 5.1 mmol/L   Chloride 74 (L) 101 - 111 mmol/L   BUN 5 (L) 6 - 20 mg/dL   Creatinine, Ser 0.60 0.44 - 1.00 mg/dL   Glucose, Bld 98 65 - 99 mg/dL   Calcium, Ion 0.99 (L) 1.15 - 1.40 mmol/L   TCO2 27 0 - 100 mmol/L   Hemoglobin 12.6 12.0 - 15.0 g/dL   HCT 37.0 36.0 - 46.0 %   Comment NOTIFIED PHYSICIAN   Urine rapid drug screen (hosp performed)not at [facility]     Status: None   Collection Time: 07/04/16 12:30 PM  Result Value Ref Range   Opiates NONE DETECTED NONE DETECTED   Cocaine NONE DETECTED NONE DETECTED   Benzodiazepines NONE DETECTED NONE DETECTED   Amphetamines NONE DETECTED NONE DETECTED   Tetrahydrocannabinol NONE DETECTED NONE DETECTED   Barbiturates NONE DETECTED NONE DETECTED    Comment:        DRUG SCREEN FOR MEDICAL PURPOSES ONLY.  IF CONFIRMATION IS NEEDED FOR ANY PURPOSE, NOTIFY LAB WITHIN 5 DAYS.        LOWEST DETECTABLE LIMITS FOR URINE DRUG SCREEN Drug Class       Cutoff (ng/mL) Amphetamine      1000 Barbiturate      200 Benzodiazepine   196 Tricyclics       222 Opiates          300 Cocaine          300 THC              50   Urinalysis, Routine w reflex microscopic     Status: Abnormal   Collection Time: 07/04/16 12:30 PM  Result Value Ref Range   Color, Urine YELLOW YELLOW   APPearance CLEAR CLEAR   Specific Gravity, Urine 1.005 1.005 - 1.030   pH 8.0 5.0 - 8.0   Glucose, UA NEGATIVE NEGATIVE mg/dL   Hgb urine dipstick NEGATIVE NEGATIVE    Bilirubin Urine NEGATIVE NEGATIVE   Ketones, ur 5 (A) NEGATIVE mg/dL   Protein, ur NEGATIVE NEGATIVE mg/dL   Nitrite NEGATIVE NEGATIVE   Leukocytes, UA NEGATIVE NEGATIVE  Magnesium     Status: Abnormal   Collection Time: 07/04/16  1:35 PM  Result Value Ref Range   Magnesium 1.5 (L) 1.7 - 2.4 mg/dL  TSH     Status: None   Collection Time: 07/04/16  1:35 PM  Result Value Ref Range   TSH 0.571 0.350 - 4.500 uIU/mL    Comment: Performed by a 3rd Generation assay with a functional sensitivity of <=0.01 uIU/mL.  Phosphorus     Status: None   Collection Time: 07/04/16  1:35 PM  Result Value Ref Range   Phosphorus 3.2 2.5 - 4.6 mg/dL  Osmolality     Status: Abnormal   Collection Time: 07/04/16  3:49 PM  Result Value Ref Range   Osmolality 250 (L) 275 - 295 mOsm/kg  Basic metabolic panel     Status: Abnormal   Collection Time: 07/04/16  3:49  PM  Result Value Ref Range   Sodium 122 (L) 135 - 145 mmol/L   Potassium 2.2 (LL) 3.5 - 5.1 mmol/L    Comment: CRITICAL RESULT CALLED TO, READ BACK BY AND VERIFIED WITH: DR Charlynn Grimes 1724 07/04/2016 WBOND    Chloride 81 (L) 101 - 111 mmol/L   CO2 29 22 - 32 mmol/L   Glucose, Bld 89 65 - 99 mg/dL   BUN <5 (L) 6 - 20 mg/dL   Creatinine, Ser 0.60 0.44 - 1.00 mg/dL   Calcium 8.8 (L) 8.9 - 10.3 mg/dL   GFR calc non Af Amer >60 >60 mL/min   GFR calc Af Amer >60 >60 mL/min    Comment: (NOTE) The eGFR has been calculated using the CKD EPI equation. This calculation has not been validated in all clinical situations. eGFR's persistently <60 mL/min signify possible Chronic Kidney Disease.    Anion gap 12 5 - 15   Ct Head Code Stroke W/o Cm  Result Date: 07/04/2016 CLINICAL DATA:  Code stroke. 65 year old female with altered mental status. Confusion and weakness. Initial encounter. EXAM: CT HEAD WITHOUT CONTRAST TECHNIQUE: Contiguous axial images were obtained from the base of the skull through the vertex without intravenous contrast. COMPARISON:   [facility] head CT 07/31/2014 and earlier. FINDINGS: Brain: Bifrontal encephalomalacia appears stable since 2016 and is related to prior resection of a large anterior meningioma. Anterior bifrontal postoperative extra-axial hematoma appears largely resolved, small residual extra-axial fluid collection without significant mass effect. No cortically based acute infarct identified. No acute intracranial hemorrhage identified. No new intracranial mass or mass effect identified. No ventriculomegaly. Vascular: Calcified atherosclerosis at the skull base. No suspicious intracranial vascular hyperdensity. Skull: Sequelae of bifrontal craniotomy re- demonstrated. No acute osseous abnormality identified. Sinuses/Orbits: New subtotal opacification of the right maxillary sinus and anterior ethmoids. Other Visualized paranasal sinuses and mastoids are stable and well pneumatized. Other: No acute orbit or scalp soft tissue findings. ASPECTS [facility] Stroke Program Early CT Score) - Ganglionic level infarction (caudate, lentiform nuclei, internal capsule, insula, M1-M3 cortex): 7 - Supraganglionic infarction (M4-M6 cortex): 3 Total score (0-10 with 10 being normal): 10 IMPRESSION: 1. No acute cortically based infarct or acute intracranial hemorrhage identified. 2. ASPECTS is 10. 3. Sequelae of previous large anterior meningioma resection with bifrontal encephalomalacia. 4. The above was relayed via text pager to Dr. Dorian Pod on 07/04/2016 at 11:57 . Electronically Signed   By: Genevie Ann M.D.   On: 07/04/2016 11:57    Assessment:  1 Acute and chronic Hyponatremia prob due to reduced solute intake  2 Hypokalemia 3 Hypomag 4 Hx of meningioma resection 5 Alcoholism 6 Hypertension 6 Altered mental status prob due to #1 Plan: 1 Judicious solute replacement with sodium, potasium and magnesium 2 Prob would stop HCTZ and use another BP med 3 Alcoholism treatment 4 Nothing to add to current care; therefore we will  sign off  Daneshia Tavano C 07/04/2016, 7:02 PM

## 2016-07-04 NOTE — ED Notes (Signed)
EEG at the bedside  ?

## 2016-07-04 NOTE — ED Provider Notes (Signed)
[facility] DEPT Provider Note   CSN: [id number] Arrival date & time: 07/04/16  1140   An emergency department physician performed an initial assessment on this suspected stroke patient at 1141 (Trevel Dillenbeck).  History   Chief Complaint Chief Complaint  Patient presents with  . Code Stroke    HPI Kristy Cabrera is a 65 y.o. female.  The history is provided by the spouse.  Altered Mental Status   This is a new problem. The current episode started 1 to 2 hours ago. The problem has not changed since onset.Associated symptoms include confusion. Pertinent negatives include no seizures and no agitation. Associated symptoms comments: Repeating questions, disoriented. Risk factors include alcohol intake.    Past Medical History:  Diagnosis Date  . Brain tumor (benign) ([place]   . Hypertension     There are no active problems to display for this patient.   Past Surgical History:  Procedure Laterality Date  . Brain tumor removal      OB History    No data available       Home Medications    Prior to Admission medications   Not on File    Family History No family history on file.  Social History Social History  Substance Use Topics  . Smoking status: Current Every Day Smoker    Packs/day: 3.00    Types: Cigarettes  . Smokeless tobacco: Never Used  . Alcohol use Yes     Comment: Unknown, but "A bunch"      Allergies   Patient has no known allergies.   Review of Systems Review of Systems  Neurological: Negative for seizures.  Psychiatric/Behavioral: Positive for confusion. Negative for agitation.  All other systems reviewed and are negative.    Physical Exam Updated Vital Signs BP 130/61   Pulse 68   Temp 97.4 F (36.3 C)   Resp 17   Ht 5\' 4"  (1.626 m)   Wt 127 lb 10.3 oz (57.9 kg)   SpO2 100%   BMI 21.91 kg/m   Physical Exam  Constitutional: She appears well-developed and well-nourished. No distress.  HENT:  Head: Normocephalic.  Nose: Nose  normal.  Mouth/Throat: Oropharynx is clear and moist.  Eyes: Conjunctivae are normal.  Neck: Neck supple. No tracheal deviation present.  Cardiovascular: Normal rate, regular rhythm and normal heart sounds.   Pulmonary/Chest: Effort normal and breath sounds normal. No respiratory distress.  Abdominal: Soft. She exhibits no distension.  Neurological: She is alert. She has normal strength. She is disoriented. No cranial nerve deficit.  Skin: Skin is warm and dry. Capillary refill takes less than 2 seconds.  Psychiatric: Her affect is blunt. Her speech is delayed (and disordered).  Vitals reviewed.    ED Treatments / Results  Labs (all labs ordered are listed, but only abnormal results are displayed) Labs Reviewed  CBC - Abnormal; Notable for the following:       Result Value   RBC 3.70 (*)    Hemoglobin 11.8 (*)    HCT 32.2 (*)    MCHC 36.6 (*)    All other components within normal limits  DIFFERENTIAL - Abnormal; Notable for the following:    Monocytes Absolute 1.1 (*)    All other components within normal limits  COMPREHENSIVE METABOLIC PANEL - Abnormal; Notable for the following:    Sodium 116 (*)    Potassium 2.5 (*)    Chloride 74 (*)    Glucose, Bld 100 (*)    AST 54 (*)  Total Bilirubin 1.4 (*)    Anion gap 17 (*)    All other components within normal limits  URINALYSIS, ROUTINE W REFLEX MICROSCOPIC - Abnormal; Notable for the following:    Ketones, ur 5 (*)    All other components within normal limits  CBG MONITORING, ED - Abnormal; Notable for the following:    Glucose-Capillary 105 (*)    All other components within normal limits  I-STAT CHEM 8, ED - Abnormal; Notable for the following:    Sodium 115 (*)    Potassium 2.5 (*)    Chloride 74 (*)    BUN 5 (*)    Calcium, Ion 0.99 (*)    All other components within normal limits  ETHANOL  PROTIME-INR  APTT  RAPID URINE DRUG SCREEN, HOSP PERFORMED  MAGNESIUM  TSH  PHOSPHORUS  NA AND K (SODIUM &  POTASSIUM), RAND UR  I-STAT TROPOININ, ED    EKG  EKG Interpretation  Date/Time:  Thursday July 04 2016 12:07:32 EST Ventricular Rate:  66 PR Interval:    QRS Duration: 134 QT Interval:  485 QTC Calculation: 509 R Axis:   82 Text Interpretation:  Sinus rhythm U waves present Ventricular premature complex Nonspecific intraventricular conduction delay Nonspecific T abnrm, anterolateral leads Confirmed by Nash Bolls MD, Tatiyana Foucher [phone]) on 07/04/2016 1:45:00 PM       Radiology Ct Head Code Stroke W/o Cm  Result Date: 07/04/2016 CLINICAL DATA:  Code stroke. 65 year old female with altered mental status. Confusion and weakness. Initial encounter. EXAM: CT HEAD WITHOUT CONTRAST TECHNIQUE: Contiguous axial images were obtained from the base of the skull through the vertex without intravenous contrast. COMPARISON:  [facility] head CT 07/31/2014 and earlier. FINDINGS: Brain: Bifrontal encephalomalacia appears stable since 2016 and is related to prior resection of a large anterior meningioma. Anterior bifrontal postoperative extra-axial hematoma appears largely resolved, small residual extra-axial fluid collection without significant mass effect. No cortically based acute infarct identified. No acute intracranial hemorrhage identified. No new intracranial mass or mass effect identified. No ventriculomegaly. Vascular: Calcified atherosclerosis at the skull base. No suspicious intracranial vascular hyperdensity. Skull: Sequelae of bifrontal craniotomy re- demonstrated. No acute osseous abnormality identified. Sinuses/Orbits: New subtotal opacification of the right maxillary sinus and anterior ethmoids. Other Visualized paranasal sinuses and mastoids are stable and well pneumatized. Other: No acute orbit or scalp soft tissue findings. ASPECTS [facility] Stroke Program Early CT Score) - Ganglionic level infarction (caudate, lentiform nuclei, internal capsule, insula, M1-M3 cortex): 7 - Supraganglionic  infarction (M4-M6 cortex): 3 Total score (0-10 with 10 being normal): 10 IMPRESSION: 1. No acute cortically based infarct or acute intracranial hemorrhage identified. 2. ASPECTS is 10. 3. Sequelae of previous large anterior meningioma resection with bifrontal encephalomalacia. 4. The above was relayed via text pager to Dr. Dorian Pod on 07/04/2016 at 11:57 . Electronically Signed   By: Genevie Ann M.D.   On: 07/04/2016 11:57    Procedures Procedures (including critical care time)  CRITICAL CARE Performed by: Leo Grosser Total critical care time: 30 minutes Critical care time was exclusive of separately billable procedures and treating other patients. Critical care was necessary to treat or prevent imminent or life-threatening deterioration. Critical care was time spent personally by me on the following activities: development of treatment plan with patient and/or surrogate as well as nursing, discussions with consultants, evaluation of patient's response to treatment, examination of patient, obtaining history from patient or surrogate, ordering and performing treatments and interventions, ordering and review of laboratory studies, ordering  and review of radiographic studies, pulse oximetry and re-evaluation of patient's condition.   Medications Ordered in ED Medications  sodium chloride 0.9 % bolus 1,000 mL (not administered)  sodium chloride 0.9 % 1,000 mL with potassium chloride 80 mEq infusion (not administered)     Initial Impression / Assessment and Plan / ED Course  I have reviewed the triage vital signs and the nursing notes.  Pertinent labs & imaging results that were available during my care of the patient were reviewed by me and considered in my medical decision making (see chart for details).  Clinical Course     65 y.o. female presents with AMS first noted this morning by the patient's husband. She had repetitive questioning and confusion, EMS was called and activated code  stroke prior to arrival. Patient arrived and had unremarkable CT, was seen by neurology recommended admission for completion of workup. On lab review the patient has acute hyponatremia, hypokalemia complicated by stated ongoing history of alcohol abuse and history of meningioma resection. She has had hyponatremia and hypokalemia in the past following her surgery according to the husband and had a similar presentation as she has been having enuresis over the last 4 days and this occurred with the prior episode.  Patient will need slow correction of various metabolic derangements and monitoring for changes in mental status. She was placed on CIWA protocol.  Final Clinical Impressions(s) / ED Diagnoses   Final diagnoses:  Acute hyponatremia  Acute hypokalemia  Adverse effect of hydrochlorothiazide, initial encounter  Diabetes insipidus [facility])    New Prescriptions New Prescriptions   No medications on file     Leo Grosser, MD 07/05/16 [phone]

## 2016-07-04 NOTE — Progress Notes (Signed)
Bedside EEG completed, results pending.  Technically difficult due to pt being tense, HOH, movement and interuptions for pt care.

## 2016-07-04 NOTE — ED Notes (Signed)
Phlebotomist at the bedside.  

## 2016-07-04 NOTE — Code Documentation (Signed)
65yo female arriving to [facility] via [facility] at 1140.  Patient from home where she was sitting at the kitchen table and began starring off and developed difficulty speaking per patient's husband.  LKW 0930 per EMS.  Patient reportedly had difficulty ambulating and her husband had to carry her.  Code stroke activated by EMS.  Stroke team at the bedside on patient arrival.  Labs drawn and patient cleared for CT by Dr. Laneta Simmers.  Patient to CT with team.  CT completed.  NIHSS 2, see documentation for details and code stroke times.  Patient unable to answer questions on exam.  Patient's husband to the bedside who reports patient has been declining over the last month.  She has a h/o brain tumor which was excised 2 years ago and has declined f/u despite attempts from husband to make appointments for her.  Husband reports patient will sit at the kitchen table all day and has been showing similar symptoms to her presentation with the brain tumor.  Husband reports patient drinks liquor all day (unable to state how many glasses) and smokes 3 packs of cigarettes per day.  Of note, patient's Na 115 on arrival.  Dr. Armida Sans at the bedside.  No acute stroke treatment at this time.  Code stroke canceled.  Bedside handoff with ED RN Jarrett Soho.

## 2016-07-04 NOTE — H&P (Signed)
Date: 07/04/2016               Patient Name:  Kristy Cabrera MRN: [id number]  DOB: [birth year] Age / Sex: 65 y.o., female   PCP: Pcp Not In System         Medical Service: Internal Medicine Teaching Service         Attending Physician: Dr. Aldine Contes, MD    First Contact: Dr. Heber [place] Pager: [phone]  Second Contact: Dr. Charlynn Grimes Pager: [phone]       After Hours (After 5p/  First Contact Pager: [phone]  weekends / holidays): Second Contact Pager: [phone]   Chief Complaint: Altered Mental Status  History of Present Illness: Kristy Cabrera is a 65 year old woman with history of meningioma s/p resection in 2015, daily ETOH abuse for over 38yrs, 3 ppd smoker and melanoma in 64s that presents to the ED with change in mental status.  Husband was present at the bedside and helped provide history.  This morning around 9:30 AM patient was sitting at the kitchen table drinking coffee when she had difficulty putting sentences together and appeared dazed. Husband became concerned and called EMS.  During the interview patient was able to converse and was oriented to person, place and time.  However, she was unable to tell us the events from earlier today.  She was slightly delayed in her responses.   Patient's husband states she has improved dramatically since being in the ED. He thinks that her delay in responding is likely from sedation.  Husband states that patient has not had any recent nausea/vomiting, shortness of breath, chest pain or abdominal pain.  Husband reports that the patient has had a slow decline in the past month. He states that she stopped bathing, would sleep most of the day and when awake would sit at the kitchen table drinking coffee and smoking cigarettes.  For the past month she has been feeling dizzy and off-balance when standing from a seated position. She is still able to do her ADLs. She drives to the grocery store and [place] once a week.  Prior to her brain  tumor resection 2 years ago patient was very social going to different events with her husband and friends. She had hobbies that included gardening.  Since her surgery she has not wanted to leave the house except for going to the store.  Husband reports that the patient has around 10 vodka drinks a night and has for the past 20 years. There has not been a change in her drinking recently.  Husband states she has never had withdrawal symptoms or seizures that he has noticed.  However he does not recall a time the patient has refrained from drinking.     On arrival to the ED a code stroke was called and a stat CT head was obtained showing with no evidence of acute infarct.  An EEG was conducted and showed marked artifact but no evidence of seizure.    Meds:  Current Meds  Medication Sig  . amLODipine (NORVASC) 10 MG tablet Take 10 mg by mouth daily.  . folic acid (FOLVITE) A999333 MCG tablet Take 400 mcg by mouth daily.  . hydrochlorothiazide (HYDRODIURIL) 25 MG tablet Take 25 mg by mouth daily.  . simvastatin (ZOCOR) 20 MG tablet Take 20 mg by mouth daily.     Allergies: Allergies as of 07/04/2016  . (No Known Allergies)   Past Medical History:  Diagnosis Date  . Brain tumor (benign) (  [facility])   . Hypertension     Family History: Unable to obtain due to patient's altered mental status  Social History: Tobacco use:  3 packs a day for 48 years (144 pack year smoking history) Alcohol use:  10 drinks a day of vodka with water for over 20 years   Review of Systems: A complete ROS was negative except as per HPI.   Physical Exam: Blood pressure 101/65, pulse 67, temperature 97.9 F (36.6 C), temperature source Oral, resp. rate 20, height 5\' 4"  (1.626 m), weight 127 lb 10.3 oz (57.9 kg), SpO2 100 %. Vitals:   07/04/16 1530 07/04/16 1545 07/04/16 1551 07/04/16 1615  BP:   120/96 (!) 99/43  Pulse: 71 70  71  Resp: 19 16  (!) 28  Temp:    98.1 F (36.7 C)  TempSrc:    Axillary  SpO2: 97% 99%   100%  Weight:      Height:       General: Vital signs reviewed.  Patient is well-developed and well-nourished, in no acute distress and cooperative with exam.  Head: Normocephalic and atraumatic. Eyes: EOMI, conjunctivae normal, no scleral icterus.  Neck: Supple, trachea midline, normal ROM, no JVD  Cardiovascular: RRR, S1 normal, S2 normal, no murmurs, gallops, or rubs. Pulmonary/Chest: Clear to auscultation bilaterally, no wheezes, rales, or rhonchi. Abdominal: Soft, non-tender, non-distended, BS +, no masses, organomegaly, or guarding present.  Musculoskeletal: No joint deformities, erythema, or stiffness, ROM full and nontender. Extremities: No lower extremity edema bilaterally,  pulses symmetric and intact bilaterally. No cyanosis or clubbing. Neurological: A&O x3, Strength is normal and symmetric bilaterally, cranial nerve II-XII are grossly intact, no focal motor deficit, sensory intact to light touch bilaterally.  Skin: Warm, dry and intact. No rashes or erythema. Psychiatric: Normal mood.  Patient is unable to provide history due to confusion   CBC    Component Value Date/Time   WBC 8.7 07/04/2016 1147   RBC 3.70 (L) 07/04/2016 1147   HGB 12.6 07/04/2016 1155   HCT 37.0 07/04/2016 1155   PLT 306 07/04/2016 1147   MCV 87.0 07/04/2016 1147   MCH 31.9 07/04/2016 1147   MCHC 36.6 (H) 07/04/2016 1147   RDW 13.8 07/04/2016 1147   LYMPHSABS 2.0 07/04/2016 1147   MONOABS 1.1 (H) 07/04/2016 1147   EOSABS 0.1 07/04/2016 1147   BASOSABS 0.1 07/04/2016 1147   BMP Latest Ref Rng & Units 07/04/2016 07/04/2016 07/04/2016  Glucose 65 - 99 mg/dL 89 98 100(H)  BUN 6 - 20 mg/dL <5(L) 5(L) 6  Creatinine 0.44 - 1.00 mg/dL 0.60 0.60 0.71  Sodium 135 - 145 mmol/L 122(L) 115(LL) 116(LL)  Potassium 3.5 - 5.1 mmol/L 2.2(LL) 2.5(LL) 2.5(LL)  Chloride 101 - 111 mmol/L 81(L) 74(L) 74(L)  CO2 22 - 32 mmol/L 29 - 25  Calcium 8.9 - 10.3 mg/dL 8.8(L) - 9.2    EKG: normal sinus rhythm, u waves  present, nonspecific t wave abnormality in anterolateral leads  CT head  IMPRESSION: 1. No acute cortically based infarct or acute intracranial hemorrhage identified. 2. ASPECTS is 10. 3. Sequelae of previous large anterior meningioma resection with bifrontal encephalomalacia.  Assessment & Plan by Problem: Active Problems:  Hypotonic Evolemic Hyponatremia In the ED patient was noted to have a sodium of 116. She was given a liter of normal saline. Consulted PCCM and recommended step down unit.   When admitted to internal medicine service fluids were discontinued.  Repeat labs showed a sodium of 122.  Will  do a repeat BMET and if sodium continues to rise then D5W will need to be started.  Ordered serum osmolality, urine sodium and urine osmolality.  Serum osmolality is 250.  Urine studies are pending.  Patient is Euvolemic on exam.  Patient is a heavy alcohol drinker and takes HCTZ.  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 SIADH vs thiazide diurectic. Will further assess with urine osmolality.  If SIADH will need to rule out malignancy given extensive smoking history.  TSH was normal. - Close monitoring  - BMET 2-4 hours  - telemetry  - fluid restriction - nephrology consulted, appreciate recommendations - Holding HCTZ  Hypokalemia Patient had a potassium of 2.5 on admission.  On repeat lab potassium was 2.2.  Patient was given 14mEq kdur and 3 runs of IV potassium.  Will need to get another dose of 71mEq kdur tonight.  Magnesium was 1.5 and is being repleated. - repleat potassium - repeat EKG  Hypertension Blood pressure is 101/65.  Patient's home medication includes amlodipine and hydrochlorothiazide.  HCTZ is likely contributing to her hyponatremia.  Blood pressure medications will be held. - holding amlodipine and HCTZ  Alcohol abuse Per husband patient has never had withdrawal symptoms or seizures.  However, he doesn't recall a time that the patient has refrained from drinking     - CIWA - Thiamine  Tobacco abuse Patient has a 144ppy smoking history.  She currently smokes 3 packs a day.  Meningioma s/p resection 2015 Patient did not follow up after brain tumor resection.  Husband encouraged patient to follow up and made two appointments for her but he states she did not want to go.  EEG was preformed and the study demonstrated no obvious abnormalities however the study was compromised by significant amount of artifacts.  A repeat study was advisable if clinically justified. -MRI   Dispo: Admit patient to Observation with expected length of stay less than 2 midnights.  Signed: Valinda Party, DO 07/04/2016, 3:32 PM  Pager: [phone]

## 2016-07-04 NOTE — ED Notes (Signed)
Patient taken to MRI

## 2016-07-04 NOTE — Procedures (Signed)
EEG report.  Brief clinical history:  65 years old female with PMH significant for brain tumor removal 2 years ago, ETOH abuse, HTN, and HLD, admitted with acute confusional state. No prior history of frank epileptic seizures.   Technique: this is a 17 channel routine scalp EEG performed at the bedside in the ED, with bipolar and monopolar montages arranged in accordance to the international 10/20 system of electrode placement. One channel was dedicated to EKG recording.  The study was performed exclusively during wakefulness. No activating procedures performed.  Description: as the study begins and for the most part of the recording, there is significant amount of artifacts that precludes a reliable background assessment. However, the readable portion of the test showed no evidence of epileptiform discharges, abnormal slowing, or obvious electrographic seizures. EKG showed sinus rhythm.  Impression: this is a technically limited EEG performed exclusively during wakefulness while the patient was in the ED. Although the study demonstrated no obvious abnormalities, it is compromised by significant amount of artifacts and therefore a repeat study would be advisable if clinically justified.  Clinical correlation is advised.  Dorian Pod, MD

## 2016-07-04 NOTE — ED Notes (Signed)
Pt being transported upstairs at this time.

## 2016-07-04 NOTE — ED Notes (Signed)
Neurology at the bedside

## 2016-07-05 DIAGNOSIS — F10239 Alcohol dependence with withdrawal, unspecified: Secondary | ICD-10-CM | POA: Diagnosis not present

## 2016-07-05 DIAGNOSIS — I10 Essential (primary) hypertension: Secondary | ICD-10-CM | POA: Diagnosis not present

## 2016-07-05 DIAGNOSIS — E1310 Other specified diabetes mellitus with ketoacidosis without coma: Secondary | ICD-10-CM

## 2016-07-05 DIAGNOSIS — E46 Unspecified protein-calorie malnutrition: Secondary | ICD-10-CM | POA: Diagnosis not present

## 2016-07-05 DIAGNOSIS — R410 Disorientation, unspecified: Secondary | ICD-10-CM | POA: Insufficient documentation

## 2016-07-05 DIAGNOSIS — E876 Hypokalemia: Secondary | ICD-10-CM | POA: Diagnosis not present

## 2016-07-05 DIAGNOSIS — F17210 Nicotine dependence, cigarettes, uncomplicated: Secondary | ICD-10-CM | POA: Diagnosis not present

## 2016-07-05 DIAGNOSIS — E871 Hypo-osmolality and hyponatremia: Secondary | ICD-10-CM | POA: Diagnosis not present

## 2016-07-05 DIAGNOSIS — G9340 Encephalopathy, unspecified: Secondary | ICD-10-CM | POA: Diagnosis not present

## 2016-07-05 DIAGNOSIS — F10288 Alcohol dependence with other alcohol-induced disorder: Secondary | ICD-10-CM

## 2016-07-05 DIAGNOSIS — Z85820 Personal history of malignant melanoma of skin: Secondary | ICD-10-CM | POA: Diagnosis not present

## 2016-07-05 DIAGNOSIS — T502X5A Adverse effect of carbonic-anhydrase inhibitors, benzothiadiazides and other diuretics, initial encounter: Secondary | ICD-10-CM | POA: Insufficient documentation

## 2016-07-05 DIAGNOSIS — D649 Anemia, unspecified: Secondary | ICD-10-CM | POA: Diagnosis not present

## 2016-07-05 DIAGNOSIS — Z86011 Personal history of benign neoplasm of the brain: Secondary | ICD-10-CM | POA: Diagnosis not present

## 2016-07-05 DIAGNOSIS — E785 Hyperlipidemia, unspecified: Secondary | ICD-10-CM | POA: Diagnosis not present

## 2016-07-05 DIAGNOSIS — F05 Delirium due to known physiological condition: Secondary | ICD-10-CM | POA: Diagnosis not present

## 2016-07-05 LAB — BASIC METABOLIC PANEL
ANION GAP: 7 (ref 5–15)
ANION GAP: 8 (ref 5–15)
Anion gap: 16 — ABNORMAL HIGH (ref 5–15)
Anion gap: 8 (ref 5–15)
Anion gap: 9 (ref 5–15)
BUN: 5 mg/dL — AB (ref 6–20)
BUN: 6 mg/dL (ref 6–20)
BUN: 5 mg/dL — AB (ref 6–20)
BUN: 5 mg/dL — AB (ref 6–20)
BUN: 5 mg/dL — AB (ref 6–20)
CALCIUM: 8.7 mg/dL — AB (ref 8.9–10.3)
CHLORIDE: 95 mmol/L — AB (ref 101–111)
CHLORIDE: 92 mmol/L — AB (ref 101–111)
CO2: 22 mmol/L (ref 22–32)
CO2: 23 mmol/L (ref 22–32)
CO2: 24 mmol/L (ref 22–32)
CO2: 24 mmol/L (ref 22–32)
CO2: 25 mmol/L (ref 22–32)
CREATININE: 0.59 mg/dL (ref 0.44–1.00)
CREATININE: 0.6 mg/dL (ref 0.44–1.00)
CREATININE: 0.64 mg/dL (ref 0.44–1.00)
Calcium: 8.5 mg/dL — ABNORMAL LOW (ref 8.9–10.3)
Calcium: 8.7 mg/dL — ABNORMAL LOW (ref 8.9–10.3)
Calcium: 8.8 mg/dL — ABNORMAL LOW (ref 8.9–10.3)
Calcium: 8.6 mg/dL — ABNORMAL LOW (ref 8.9–10.3)
Chloride: 83 mmol/L — ABNORMAL LOW (ref 101–111)
Chloride: 92 mmol/L — ABNORMAL LOW (ref 101–111)
Chloride: 93 mmol/L — ABNORMAL LOW (ref 101–111)
Creatinine, Ser: 0.64 mg/dL (ref 0.44–1.00)
Creatinine, Ser: 0.62 mg/dL (ref 0.44–1.00)
GFR calc Af Amer: >60 mL/min (ref >60)
GFR calc Af Amer: >60 mL/min (ref >60)
GFR calc Af Amer: >60 mL/min (ref >60)
GFR calc Af Amer: >60 mL/min (ref >60)
GFR calc Af Amer: >60 mL/min (ref >60)
GFR calc non Af Amer: >60 mL/min (ref >60)
GFR calc non Af Amer: >60 mL/min (ref >60)
GLUCOSE: 71 mg/dL (ref 65–99)
GLUCOSE: 81 mg/dL (ref 65–99)
GLUCOSE: 87 mg/dL (ref 65–99)
Glucose, Bld: 143 mg/dL — ABNORMAL HIGH (ref 65–99)
Glucose, Bld: 198 mg/dL — ABNORMAL HIGH (ref 65–99)
POTASSIUM: 4.5 mmol/L (ref 3.5–5.1)
POTASSIUM: 3.5 mmol/L (ref 3.5–5.1)
POTASSIUM: 3.4 mmol/L — AB (ref 3.5–5.1)
Potassium: 2.6 mmol/L — CL (ref 3.5–5.1)
Potassium: 4.5 mmol/L (ref 3.5–5.1)
SODIUM: 124 mmol/L — AB (ref 135–145)
SODIUM: 126 mmol/L — AB (ref 135–145)
SODIUM: 121 mmol/L — AB (ref 135–145)
SODIUM: 123 mmol/L — AB (ref 135–145)
SODIUM: 127 mmol/L — AB (ref 135–145)

## 2016-07-05 LAB — NA AND K (SODIUM & POTASSIUM), RAND UR
POTASSIUM UR: 51 mmol/L
SODIUM UR: 38 mmol/L

## 2016-07-05 LAB — CBC
HCT: 29.7 % — ABNORMAL LOW (ref 36.0–46.0)
HEMOGLOBIN: 10.7 g/dL — AB (ref 12.0–15.0)
MCH: 32.1 pg (ref 26.0–34.0)
MCHC: 36 g/dL (ref 30.0–36.0)
MCV: 89.2 fL (ref 78.0–100.0)
PLATELETS: 281 10*3/uL (ref 150–400)
RBC: 3.33 MIL/uL — ABNORMAL LOW (ref 3.87–5.11)
RDW: 13.7 % (ref 11.5–15.5)
WBC: 7.8 10*3/uL (ref 4.0–10.5)

## 2016-07-05 LAB — OSMOLALITY, URINE: OSMOLALITY UR: 227 mosm/kg — AB (ref 300–900)

## 2016-07-05 LAB — BASIC METABOLIC PANEL WITH GFR
Anion gap: 8 (ref 5–15)
BUN: <5 mg/dL — ABNORMAL LOW (ref 6–20)
CO2: 24 mmol/L (ref 22–32)
Calcium: 8.6 mg/dL — ABNORMAL LOW (ref 8.9–10.3)
Chloride: 95 mmol/L — ABNORMAL LOW (ref 101–111)
Creatinine, Ser: 0.61 mg/dL (ref 0.44–1.00)
GFR calc Af Amer: >60 mL/min
GFR calc non Af Amer: >60 mL/min
Glucose, Bld: 126 mg/dL — ABNORMAL HIGH (ref 65–99)
Potassium: 4 mmol/L (ref 3.5–5.1)
Sodium: 127 mmol/L — ABNORMAL LOW (ref 135–145)

## 2016-07-05 LAB — GLUCOSE, CAPILLARY: Glucose-Capillary: 136 mg/dL — ABNORMAL HIGH (ref 65–99)

## 2016-07-05 LAB — MAGNESIUM
MAGNESIUM: 2.7 mg/dL — AB (ref 1.7–2.4)
Magnesium: 2.2 mg/dL (ref 1.7–2.4)

## 2016-07-05 LAB — PHOSPHORUS: PHOSPHORUS: 2.9 mg/dL (ref 2.5–4.6)

## 2016-07-05 MED ORDER — POTASSIUM CHLORIDE CRYS ER 20 MEQ PO TBCR
40.0000 meq | EXTENDED_RELEASE_TABLET | Freq: Two times a day (BID) | ORAL | Status: DC
  Administered 2016-07-05 – 2016-07-06 (×2): 40 meq via ORAL
  Filled 2016-07-05 (×2): qty 2

## 2016-07-05 MED ORDER — DEXTROSE 5 % IV SOLN
INTRAVENOUS | Status: DC
  Administered 2016-07-05: 23:00:00 via INTRAVENOUS

## 2016-07-05 MED ORDER — DEXTROSE 5 % IV SOLN
INTRAVENOUS | Status: DC
  Administered 2016-07-05: 13:00:00 via INTRAVENOUS

## 2016-07-05 MED ORDER — DEXTROSE 5 % IV SOLN
INTRAVENOUS | Status: DC
  Administered 2016-07-05 (×2): via INTRAVENOUS

## 2016-07-05 MED ORDER — DEXTROSE 5 % IV SOLN: INTRAVENOUS | Status: DC

## 2016-07-05 NOTE — Progress Notes (Signed)
Internal Medicine Attending  Date: 07/05/2016  Patient name: Kristy Cabrera Medical record number: [id number] Date of birth: [birth year] Age: 65 y.o. Gender: female  I saw and evaluated the patient. I reviewed the resident's note by Dr. Young Berry and I agree with the resident's findings and plans as documented in her progress note.  When seen on rounds this morning her mental status was reportedly much improved. Although her husband states she's not quite at her baseline he feels she's not far off. He is quite pleased with her appetite. Over the last 24 hours her sodium has increased from 116 to 127. This is with the liter bolus she received in the emergency room of normal saline. She's received no further normal saline. D5W at 75 mL/h was started to stabilize the sodium at this level and decrease the chances of overcorrection. Her sodium has stabilized at 127 over the last several hours despite a much improved oral intake. We will continue to follow serial basic metabolic panels every 4-6 hours to assure the sodium remains in the 127-128 range over the next 24 hours. The hydrochlorothiazide has been discontinued for good given her chronic alcohol abuse. She remains on the CIWA protocol. We are hopeful that if she stabilizes from a mental status, metabolic, and functional status she could be ready for discharge home tomorrow. If she is discharged home she will require home physical and occupational therapy and continuing education on the importance of cutting down and eventually weaning off all alcohol. If she requires reinstitution of her antihypertensive regimen she will not be placed on a diuretic and will likely be put back on her amlodipine.

## 2016-07-05 NOTE — Evaluation (Signed)
Occupational Therapy Evaluation Patient Details Name: Malerie Petrone MRN: [id number] DOB: 20-Feb-1951 Today's Date: 07/05/2016    History of Present Illness Patient presents with difficulty speaking and confusion * 1 day likely 2nd to She likely has symptomatic hyponatremia. PMHX: brain sx--meningioma s/p resection, HTN, ETOH   Clinical Impression   This 65 yo female admitted with above presents to acute OT with deficits below (see OT problem list) thus affecting her PLOF of Independent with basic and IADLs. She will benefit from acute OT with follow up OT at SNF (unless she progresses quickly back to baseline then home with HHOT and 24 hour S./prn A.    Follow Up Recommendations  SNF;Supervision/Assistance - 24 hour;Other (comment) (if she progresses quickly to close to baseline and has 24 hour S/prn A then could go home with [facility])    Equipment Recommendations  3 in 1 bedside commode;Tub/shower seat       Precautions / Restrictions Precautions Precautions: Fall Restrictions Weight Bearing Restrictions: No      Mobility Bed Mobility Overal bed mobility: Needs Assistance Bed Mobility: Rolling;Sidelying to Sit Rolling: Min assist Sidelying to sit: Mod assist;HOB elevated       General bed mobility comments: Verbal cues for technique. Assist to elevate trunk into sitting.  Transfers Overall transfer level: Needs assistance Equipment used: Rolling walker (2 wheeled) Transfers: Sit to/from Stand Sit to Stand: Min assist              Balance Overall balance assessment: Needs assistance Sitting-balance support: No upper extremity supported;Feet supported Sitting balance-Leahy Scale: Good Sitting balance - Comments: Sat EOB and able to don socks   Standing balance support: Bilateral upper extremity supported;During functional activity Standing balance-Leahy Scale: Poor                              ADL Overall ADL's : Needs  assistance/impaired Eating/Feeding: Independent;Sitting   Grooming: Set up;Sitting   Upper Body Bathing: Set up;Sitting   Lower Body Bathing: Minimal assistance;Sit to/from stand   Upper Body Dressing : Set up;Sitting   Lower Body Dressing: Minimal assistance;Sit to/from stand   Toilet Transfer: Minimal assistance;Ambulation;RW Toilet Transfer Details (indicate cue type and reason): bed>out door down hallway and back >sit in recliner Toileting- Clothing Manipulation and Hygiene: Minimal assistance;Sit to/from stand                         Pertinent Vitals/Pain Pain Assessment: No/denies pain     Hand Dominance Right   Extremity/Trunk Assessment Upper Extremity Assessment Upper Extremity Assessment: Overall WFL for tasks assessed   Lower Extremity Assessment Lower Extremity Assessment: Defer to PT evaluation       Communication Communication Communication: HOH   Cognition Arousal/Alertness: Awake/alert Behavior During Therapy: WFL for tasks assessed/performed Overall Cognitive Status: Within Functional Limits for tasks assessed                                Home Living Family/patient expects to be discharged to:: Private residence Living Arrangements: Spouse/significant other Available Help at Discharge: Family;Available PRN/intermittently Type of Home: House Home Access: Stairs to enter [employer] of Steps: 4 Entrance Stairs-Rails: Right;Left Home Layout: Two level;Able to live on main level with bedroom/bathroom     Bathroom Shower/Tub: Occupational psychologist: Standard     Home Equipment: Environmental consultant - 2  wheels          Prior Functioning/Environment Level of Independence: Needs assistance  Gait / Transfers Assistance Needed: Amb without assistive device              OT Problem List: Impaired balance (sitting and/or standing);Decreased safety awareness   OT Treatment/Interventions: Self-care/ADL  training;Therapeutic activities;Patient/family education;DME and/or AE instruction;Balance training    OT Goals(Current goals can be found in the care plan section) Acute Rehab OT Goals Patient Stated Goal: to go home OT Goal Formulation: With patient Time For Goal Achievement: 07/12/16 Potential to Achieve Goals: Good  OT Frequency: Min 2X/week   Barriers to D/C: Decreased caregiver support          Co-evaluation PT/OT/SLP Co-Evaluation/Treatment: Yes Reason for Co-Treatment: For patient/therapist safety   OT goals addressed during session: ADL's and self-care;Strengthening/ROM      End of Session Equipment Utilized During Treatment: Gait belt;Rolling walker  Activity Tolerance: Patient tolerated treatment well Patient left: in chair;with call bell/phone within reach;with chair alarm set   Time: 1425-1448 OT Time Calculation (min): 23 min Charges:  OT General Charges $OT Visit: 1 Procedure OT Evaluation $OT Eval Moderate Complexity: 1 Procedure  Almon Register [id number] 07/05/2016, 3:07 PM

## 2016-07-05 NOTE — Progress Notes (Signed)
PULMONARY / CRITICAL CARE MEDICINE   Name: Kristy Cabrera MRN: [id number] DOB: 10-May-1951    ADMISSION DATE:  07/04/2016 CONSULTATION DATE:  12/14 REFERRING MD:  Eppie Gibson   CHIEF COMPLAINT:  Hyponatremia and encephalopathy   HISTORY OF PRESENT ILLNESS:   65 year old white female w/ significant h/o HTN, prior crani for tumor resection 2 years ago, daily ETOH abuse in excess of 20 yrs and 3 ppd smoker. Per husband has been "declining" over the last month. Acutely on the am husband noted her to be confused and not responding to questions. At that point shwe was unable to stand and kept repeating phrases so he called EMS. On arrival CODE STROKE was called. A STAT CT head was obtained: there was no evidence of acute infarct. An EEG was obtained which showed marked artifact but no evidence of seizure. Admitting labs were drawn which showed sodium of 115. PCCM asked to see her in consult due to her severe hyponatremia. On our arrival she was awake, alert, tremulous but oriented.  SUBJECTIVE:  No events overnight, intermittently confused  VITAL SIGNS: BP (!) 115/58 (BP Location: Right Arm)   Pulse 76   Temp 97.7 F (36.5 C) (Oral)   Resp (!) 23   Ht 5\' 4"  (1.626 m)   Wt 57.2 kg (126 lb 1.7 oz)   SpO2 97%   BMI 21.65 kg/m   HEMODYNAMICS:    VENTILATOR SETTINGS:    INTAKE / OUTPUT: I/O last 3 completed shifts: In: 2530 [I.V.:1000; IV Piggyback:1530] Out: 0   PHYSICAL EXAMINATION: General:  Frail chronically ill appearing female. Not in acute distress  Neuro:  Awake, hard of hearing. Currently oriented x4. Moves all extremities, tremulous. HEENT:  NCAT, no JVD MMM Cardiovascular:  RRR w/out MRG Lungs:  Clear and w/out accessory use  Abdomen:  Soft, not tender + bowel sounds  Musculoskeletal:  Equal st and bulk  Skin:  Warm and dry   LABS:  BMET  Recent Labs Lab 07/04/16 2354 07/05/16 0410 07/05/16 0739  NA 121* 123* 127*  K 2.6* 4.5 4.5  CL 83* 92* 93*  CO2 22 23 25    BUN 5* 5* 5*  CREATININE 0.59 0.64 0.60  GLUCOSE 81 71 87    Electrolytes  Recent Labs Lab 07/04/16 1335  07/04/16 2354 07/05/16 0410 07/05/16 0739  CALCIUM  --   < > 8.7* 8.5* 8.7*  MG 1.5*  --  2.7* 2.2  --   PHOS 3.2  --   --  2.9  --   < > = values in this interval not displayed.  CBC  Recent Labs Lab 07/04/16 1147 07/04/16 1155 07/05/16 0410  WBC 8.7  --  7.8  HGB 11.8* 12.6 10.7*  HCT 32.2* 37.0 29.7*  PLT 306  --  281    Coag's  Recent Labs Lab 07/04/16 1147  APTT 33  INR 0.94    Sepsis Markers No results for input(s): LATICACIDVEN, PROCALCITON, O2SATVEN in the last 168 hours.  ABG No results for input(s): PHART, PCO2ART, PO2ART in the last 168 hours.  Liver Enzymes  Recent Labs Lab 07/04/16 1147  AST 54*  ALT 18  ALKPHOS 115  BILITOT 1.4*  ALBUMIN 3.7    Cardiac Enzymes No results for input(s): TROPONINI, PROBNP in the last 168 hours.  Glucose  Recent Labs Lab 07/04/16 1141  GLUCAP 105*    Imaging Mr Jeri Cos [id number] Contrast  Result Date: 07/04/2016 CLINICAL DATA:  Initial evaluation for acute confusion.  History of prior meningioma resection. EXAM: MRI HEAD WITHOUT AND WITH CONTRAST TECHNIQUE: Multiplanar, multiecho pulse sequences of the brain and surrounding structures were obtained without and with intravenous contrast. CONTRAST:  63mL MULTIHANCE GADOBENATE DIMEGLUMINE 529 MG/ML IV SOLN COMPARISON:  Prior CT from earlier the same day. FINDINGS: Brain: Study moderately degraded by motion artifact. Diffuse prominence of the CSF containing spaces is compatible with generalized cerebral atrophy. Patchy and confluent T2/FLAIR hyperintensity within the periventricular and deep white matter most compatible chronic microvascular ischemic disease. No abnormal foci of restricted diffusion to suggest acute or subacute ischemia. No evidence for acute or chronic intracranial hemorrhage. Bifrontal encephalomalacia with gliosis present, suspected to  be related to history of prior meningioma resection. Possible remote trauma could also conceivably be considered. There is associated mild ex vacuo dilatation of the anterior horns of both lateral ventricles. No enhancing lesion within this area to suggest residual tumor. Mild diffuse smooth dural thickening most likely postoperative in nature. No other mass lesion identified. No mass effect or midline shift. Mild ventricular prominence related to global parenchymal volume loss of hydrocephalus. No extra-axial fluid collection. Major dural sinuses are grossly patent. Vascular: Major intracranial vascular flow voids are preserved. Skull and upper cervical spine: Craniocervical junction within normal limits. Visualized upper cervical spine unremarkable. Bone marrow signal intensity within normal limits. No acute scalp soft tissue abnormality. Sinuses/Orbits: Globes and orbital soft tissues grossly unremarkable. Right maxillary sinus is completely opacified, likely chronic. Scattered opacity within the right ethmoidal air cells present as well. Scattered mucosal thickening within the remainder of the ethmoidal air cells and sphenoid sinuses. Trace bilateral mastoid effusions. Inner ear structures grossly normal. IMPRESSION: 1. No acute intracranial process identified. 2. Bifrontal encephalomalacia and gliosis, suspected to be postoperative in nature related to prior meningioma resection. No residual enhancing tumor identified on this motion degraded study. 3. Mild chronic microvascular ischemic disease. 4. Chronic right maxillary and ethmoidal sinus disease. Electronically Signed   By: Jeannine Boga M.D.   On: 07/04/2016 21:32   Ct Head Code Stroke W/o Cm  Result Date: 07/04/2016 CLINICAL DATA:  Code stroke. 65 year old female with altered mental status. Confusion and weakness. Initial encounter. EXAM: CT HEAD WITHOUT CONTRAST TECHNIQUE: Contiguous axial images were obtained from the base of the skull  through the vertex without intravenous contrast. COMPARISON:  [facility] head CT 07/31/2014 and earlier. FINDINGS: Brain: Bifrontal encephalomalacia appears stable since 2016 and is related to prior resection of a large anterior meningioma. Anterior bifrontal postoperative extra-axial hematoma appears largely resolved, small residual extra-axial fluid collection without significant mass effect. No cortically based acute infarct identified. No acute intracranial hemorrhage identified. No new intracranial mass or mass effect identified. No ventriculomegaly. Vascular: Calcified atherosclerosis at the skull base. No suspicious intracranial vascular hyperdensity. Skull: Sequelae of bifrontal craniotomy re- demonstrated. No acute osseous abnormality identified. Sinuses/Orbits: New subtotal opacification of the right maxillary sinus and anterior ethmoids. Other Visualized paranasal sinuses and mastoids are stable and well pneumatized. Other: No acute orbit or scalp soft tissue findings. ASPECTS [facility] Stroke Program Early CT Score) - Ganglionic level infarction (caudate, lentiform nuclei, internal capsule, insula, M1-M3 cortex): 7 - Supraganglionic infarction (M4-M6 cortex): 3 Total score (0-10 with 10 being normal): 10 IMPRESSION: 1. No acute cortically based infarct or acute intracranial hemorrhage identified. 2. ASPECTS is 10. 3. Sequelae of previous large anterior meningioma resection with bifrontal encephalomalacia. 4. The above was relayed via text pager to Dr. Dorian Pod on 07/04/2016 at 11:57 . Electronically Signed  By: Genevie Ann M.D.   On: 07/04/2016 11:57     STUDIES:    CULTURES:   ANTIBIOTICS:   SIGNIFICANT EVENTS: EEG 12/14: no clear seizure   LINES/TUBES:  I reviewed head CT myself, no acute disease noted, old changes.  DISCUSSION: 65 year old female w/ sig h/o ETOH abuse. Presents w/ encephalopathy and hyponatremia (Na 116). Not currently seizing. She is hemodynamically  stable. Would admit to the SDU setting. F/u pending urine studies and slowly correct Na (goal 4-6 meq over 24hrs). Given her ETOH history things may get worse before they get better as she is at high risk for ETOH withdrawal. Would continue CIWA protocol.   ASSESSMENT / PLAN:  NEUROLOGIC A:   Acute encephalopathy-->seems to have improved. In setting of hyponatremia. Also consider  ? Withdrawal ? Isolated seizure?  P:   CIWA protocol  Slow correction of Na Minimize sedation as able  PULMONARY A: No acute but is aspiration risk P:   Diet when mental status allows  CARDIOVASCULAR A:  No acute P:  Tele  Tachycardia if occurs could be a sign of DTs  RENAL A:   Severe hyponatremia -->she is on HCTZ  Hypokalemia  P:   Ck urine Na and Urine Osmo, and serum osm per nephrology No further HCTZ  On D5W at 75, would get frequent Na levels to avoid extreme fluctuations in sodium levels that can cause more issues, patient is likely chronically hyponatremic due to alcoholism. Replace K  Replace Mg  GASTROINTESTINAL A:   Protein calorie malnutrition  P:   NPO for now Assess for PO intake Aspiration precautions  HEMATOLOGIC A:   Anemia  P:  [place] heparin  Trend CBC   INFECTIOUS A:   No evidence of infection  P:   Trend fever and WBC curve  ENDOCRINE A:   No issues P:   Assess am glucose on chemistry   FAMILY  - Updates: No family bedside.  Discussed with PCCM-NP.  PCCM will sign off, please call back if needed.  Rush Farmer, M.D. [facility] Pulmonary/Critical Care Medicine. Pager: [phone]. After hours pager: [phone].

## 2016-07-05 NOTE — Progress Notes (Signed)
   Subjective: Patient was at this morning on rounds. She was sitting up and eating breakfast. She denies shortness of breath, chest pain, or helped leg swelling.  She was in a pleasant mood and was alert and oriented to person, place and time.  Objective:  Vital signs in last 24 hours: Vitals:   07/05/16 0440 07/05/16 0525 07/05/16 0733 07/05/16 1140  BP: (!) 84/45 (!) 113/47 (!) 115/58 110/87  Pulse: 70 84 76 83  Resp: 18 (!) 23 (!) 23 20  Temp: 97.6 F (36.4 C)  97.7 F (36.5 C) 99 F (37.2 C)  TempSrc: Axillary  Oral Oral  SpO2: 97% 98% 97% 97%  Weight: 126 lb 1.7 oz (57.2 kg)     Height:       Physical Exam  Constitutional: She is oriented to person, place, and time. No distress.  Cardiovascular: Normal rate, regular rhythm and normal heart sounds.  Exam reveals no gallop and no friction rub.   No murmur heard. Pulmonary/Chest: Effort normal and breath sounds normal. No respiratory distress. She has no wheezes.  Abdominal: Soft. She exhibits no distension. There is no tenderness.  Musculoskeletal: She exhibits no edema.  Neurological: She is alert and oriented to person, place, and time.     Assessment/Plan:  Active Problems:   Hyponatremia   Acute hyponatremia   Alcohol abuse   Alcohol withdrawal syndrome with complication (HCC)   Acute encephalopathy  Beer Potomania  Patient's sodium was 123 this morning and rose to 127 on repeat BMET and has remained stable.  Patient has eaten today, and well for the first time in a long time according to the husband.  Will continue D5W at 75cc as we would not want sodium to increase above 128 in the next 24hr.  If continues to rise would consider increasing D5W. - D5W 75cc - BMET Q4  - telemetry   Hypokalemia Patient had a potassium of 2.5 on admission.  Potassium was replenished and is currently 4.0.   Magnesium was 1.5 and is currently 2.2 after replenishing.  - monitor with BMET  Hypertension Blood pressure is 110/87.   Patient's home medication includes amlodipine and hydrochlorothiazide.  HCTZ has been discontinued on admission and this medication should be discontinued indefinitely given her excess alcohol abuse and poor oral intake.  Holding amlodipine.  Can assess outpatient if patient needs additional blood pressure medications - holding amlodipine   Alcohol abuse Per husband patient has never had withdrawal symptoms or seizures.  However, he doesn't recall a time that the patient has refrained from drinking.   Per chart last CIWA score was 10 (tremors =  5). Patient has been receiving Ativan for her withdrawal symptoms. - CIWA - Thiamine  Tobacco abuse Patient has a 144ppy smoking history.  She currently smokes 3 packs a day.  Meningioma s/p resection 2015 Patient did not follow up after brain tumor resection.  On this admission MRI showed no acute intracranial process.    Dispo: Anticipated discharge 1-2 days pending clinical improvement.   Valinda Party, DO 07/05/2016, 2:25 PM Pager: [phone]

## 2016-07-05 NOTE — Evaluation (Signed)
Physical Therapy Evaluation Patient Details Name: Kristy Cabrera MRN: [id number] DOB: Oct 15, 1950 Today's Date: 07/05/2016   History of Present Illness  Patient presents with difficulty speaking and confusion * 1 day likely 2nd to She likely has symptomatic hyponatremia. PMHX: brain sx--meningioma s/p resection, HTN, ETOH  Clinical Impression  Pt admitted with above diagnosis and presents to PT with functional limitations due to deficits listed below (See PT problem list). Pt needs skilled PT to maximize independence and safety to allow discharge to ST-SNF or home with husband if she returns quickly to baseline.      Follow Up Recommendations SNF (Unless she quickly returns to baseline or has 24 hr assist)    Equipment Recommendations  None recommended by PT    Recommendations for Other Services       Precautions / Restrictions Precautions Precautions: Fall Restrictions Weight Bearing Restrictions: No      Mobility  Bed Mobility Overal bed mobility: Needs Assistance Bed Mobility: Rolling;Sidelying to Sit Rolling: Min assist Sidelying to sit: Mod assist;HOB elevated       General bed mobility comments: Verbal cues for technique. Assist to elevate trunk into sitting.  Transfers Overall transfer level: Needs assistance Equipment used: Rolling walker (2 wheeled) Transfers: Sit to/from Stand Sit to Stand: Min assist         General transfer comment: Assist for balance  Ambulation/Gait Ambulation/Gait assistance: Min assist;+2 safety/equipment Ambulation Distance (Feet): 130 Feet Assistive device: 4-wheeled walker Gait Pattern/deviations: Step-through pattern;Decreased step length - right;Decreased step length - left;Drifts right/left Gait velocity: decr Gait velocity interpretation: Below normal speed for age/gender General Gait Details: Assist for balance and support and to steer rollator. Pt tremulous.  Stairs            Wheelchair Mobility     Modified Rankin (Stroke Patients Only)       Balance Overall balance assessment: Needs assistance Sitting-balance support: No upper extremity supported;Feet supported Sitting balance-Leahy Scale: Good Sitting balance - Comments: Sat EOB and able to don socks   Standing balance support: Bilateral upper extremity supported;During functional activity Standing balance-Leahy Scale: Poor Standing balance comment: rollator and min A for static standing                             Pertinent Vitals/Pain Pain Assessment: No/denies pain    Home Living Family/patient expects to be discharged to:: Private residence Living Arrangements: Spouse/significant other Available Help at Discharge: Family;Available PRN/intermittently Type of Home: House Home Access: Stairs to enter Entrance Stairs-Rails: Right;Left Entrance Stairs-Number of Steps: 4 Home Layout: Two level;Able to live on main level with bedroom/bathroom Home Equipment: Gilford Rile - 2 wheels      Prior Function Level of Independence: Needs assistance   Gait / Transfers Assistance Needed: Amb without assistive device           Hand Dominance   Dominant Hand: Right    Extremity/Trunk Assessment   Upper Extremity Assessment Upper Extremity Assessment: Defer to OT evaluation    Lower Extremity Assessment Lower Extremity Assessment: Generalized weakness       Communication   Communication: HOH  Cognition Arousal/Alertness: Awake/alert Behavior During Therapy: WFL for tasks assessed/performed Overall Cognitive Status: Impaired/Different from baseline Area of Impairment: Safety/judgement               General Comments: Observed pt 30 minutes after eval and she had scooted down recliner seat and was primarily on leg rest instead of seat.  General Comments      Exercises     Assessment/Plan    PT Assessment Patient needs continued PT services  PT Problem List Decreased strength;Decreased  activity tolerance;Decreased balance;Decreased mobility;Decreased cognition;Decreased knowledge of use of DME;Decreased safety awareness          PT Treatment Interventions DME instruction;Gait training;Functional mobility training;Therapeutic activities;Therapeutic exercise;Balance training;Stair training;Patient/family education    PT Goals (Current goals can be found in the Care Plan section)  Acute Rehab PT Goals Patient Stated Goal: to go home PT Goal Formulation: With patient Time For Goal Achievement: 07/12/16 Potential to Achieve Goals: Good    Frequency Min 3X/week   Barriers to discharge        Co-evaluation PT/OT/SLP Co-Evaluation/Treatment: Yes Reason for Co-Treatment: For patient/therapist safety PT goals addressed during session: Mobility/safety with mobility;Proper use of DME OT goals addressed during session: ADL's and self-care;Strengthening/ROM       End of Session Equipment Utilized During Treatment: Gait belt Activity Tolerance: Patient tolerated treatment well Patient left: in chair;with call bell/phone within reach;with chair alarm set Nurse Communication: Mobility status         Time: [id number] PT Time Calculation (min) (ACUTE ONLY): 30 min   Charges:   PT Evaluation $PT Eval Moderate Complexity: 1 Procedure     PT G CodesShary Decamp [facility] 2016/07/11, 4:27 PM [employer] PT [phone]

## 2016-07-05 NOTE — Progress Notes (Signed)
Patient to transfer [phone] report given to receiving nurse, all questions answered at this time.  Pt. VSS with no s/s of distress noted.  Patient stable for transfer.

## 2016-07-06 DIAGNOSIS — E871 Hypo-osmolality and hyponatremia: Secondary | ICD-10-CM | POA: Diagnosis not present

## 2016-07-06 DIAGNOSIS — G9340 Encephalopathy, unspecified: Secondary | ICD-10-CM | POA: Diagnosis not present

## 2016-07-06 DIAGNOSIS — I10 Essential (primary) hypertension: Secondary | ICD-10-CM | POA: Diagnosis not present

## 2016-07-06 DIAGNOSIS — F10288 Alcohol dependence with other alcohol-induced disorder: Secondary | ICD-10-CM | POA: Diagnosis not present

## 2016-07-06 DIAGNOSIS — E876 Hypokalemia: Secondary | ICD-10-CM | POA: Diagnosis not present

## 2016-07-06 LAB — BASIC METABOLIC PANEL
Anion gap: 9 (ref 5–15)
Anion gap: 9 (ref 5–15)
Anion gap: 8 (ref 5–15)
BUN: <5 mg/dL — ABNORMAL LOW (ref 6–20)
CALCIUM: 8.7 mg/dL — AB (ref 8.9–10.3)
CALCIUM: 8.6 mg/dL — AB (ref 8.9–10.3)
CHLORIDE: 94 mmol/L — AB (ref 101–111)
CO2: 22 mmol/L (ref 22–32)
CO2: 22 mmol/L (ref 22–32)
CO2: 23 mmol/L (ref 22–32)
CREATININE: 0.56 mg/dL (ref 0.44–1.00)
CREATININE: 0.52 mg/dL (ref 0.44–1.00)
CREATININE: 0.49 mg/dL (ref 0.44–1.00)
Calcium: 8.8 mg/dL — ABNORMAL LOW (ref 8.9–10.3)
Chloride: 93 mmol/L — ABNORMAL LOW (ref 101–111)
Chloride: 92 mmol/L — ABNORMAL LOW (ref 101–111)
GFR calc Af Amer: >60 mL/min (ref >60)
GFR calc Af Amer: >60 mL/min (ref >60)
GFR calc non Af Amer: >60 mL/min (ref >60)
GFR calc non Af Amer: >60 mL/min (ref >60)
GFR calc non Af Amer: >60 mL/min (ref >60)
GLUCOSE: 91 mg/dL (ref 65–99)
GLUCOSE: 96 mg/dL (ref 65–99)
GLUCOSE: 97 mg/dL (ref 65–99)
Potassium: 3.9 mmol/L (ref 3.5–5.1)
Potassium: 3.9 mmol/L (ref 3.5–5.1)
Potassium: 4.1 mmol/L (ref 3.5–5.1)
SODIUM: 125 mmol/L — AB (ref 135–145)
Sodium: 124 mmol/L — ABNORMAL LOW (ref 135–145)
Sodium: 123 mmol/L — ABNORMAL LOW (ref 135–145)

## 2016-07-06 LAB — CBC
HCT: 28 % — ABNORMAL LOW (ref 36.0–46.0)
Hemoglobin: 9.9 g/dL — ABNORMAL LOW (ref 12.0–15.0)
MCH: 31.2 pg (ref 26.0–34.0)
MCHC: 35.4 g/dL (ref 30.0–36.0)
MCV: 88.3 fL (ref 78.0–100.0)
PLATELETS: 289 10*3/uL (ref 150–400)
RBC: 3.17 MIL/uL — ABNORMAL LOW (ref 3.87–5.11)
RDW: 13.6 % (ref 11.5–15.5)
WBC: 8.9 10*3/uL (ref 4.0–10.5)

## 2016-07-06 MED ORDER — THIAMINE HCL 100 MG PO TABS: 100.0000 mg | ORAL_TABLET | Freq: Every day | ORAL | 0 refills | Status: DC

## 2016-07-06 NOTE — Progress Notes (Signed)
NURSING PROGRESS NOTE  Kristy Cabrera [id number] Discharge Data: 07/06/2016 2:58 PM Attending Provider: Oval Linsey, MD PCP:Pcp Not In [place] to be D/C'd Home per MD order.    All IV's will be discontinued and monitored for bleeding.  All belongings will be returned to patient for patient to take home.  Last Documented Vital Signs:  Blood pressure (!) 131/48, pulse 79, temperature 98.4 F (36.9 C), resp. rate 18, height 5\' 4"  ([id number] m), weight 57.2 kg (126 lb 1.7 oz), SpO2 98 %.  [name], MSN, RN, [employer]

## 2016-07-06 NOTE — Progress Notes (Signed)
   Subjective:  No complaints this morning. She reports she was able to eat most of breakfast this morning. Per husband at bedside, she is back to her baseline mental status. Denies any shortness of breath, chest pain, or leg swelling.  She was in a pleasant mood and was alert and oriented to person, place and time.  Objective:  Vital signs in last 24 hours: Vitals:   07/05/16 1500 07/05/16 1611 07/06/16 0020 07/06/16 1227  BP: 110/67 122/63 (!) 117/53 (!) 131/48  Pulse: 86 81 78 79  Resp: 20 18 18 18   Temp: 97.9 F (36.6 C) 97.8 F (36.6 C) 97.7 F (36.5 C) 98.4 F (36.9 C)  TempSrc: Oral Oral Oral   SpO2: 100% 97% 97% 98%  Weight:      Height:       Physical Exam  Constitutional: She is oriented to person, place, and time. No distress.  Cardiovascular: Normal rate, regular rhythm and normal heart sounds.  Exam reveals no gallop and no friction rub.   No murmur heard. Pulmonary/Chest: Effort normal and breath sounds normal. No respiratory distress. She has no wheezes.  Abdominal: Soft. She exhibits no distension. There is no tenderness.  Musculoskeletal: She exhibits no edema.  Neurological: She is alert and oriented to person, place, and time.   Assessment/Plan:  Beer Potomania  Patient's sodium dropped to 123 this morning with D5W. Rose to 125 later this morning on recheck after stopping the fluids. She has been eating and drinking well. Suspect her hyponatremia is chronic secondary to her chronic alcohol abuse with poor PO intake. Extensive discussion with patient and husband about her ETOH and maintaining good PO intake. Declined SNF placement and would prefer home health PT. Report they have a family physician near them they would like to follow up with after discharge.  -CM for HHPT -Return precautions -Close follow up with PCP  Hypokalemia K 4.0 today  Hypertension BP stable.  Patient's home medication includes amlodipine and hydrochlorothiazide.  HCTZ has been  discontinued on admission and this medication should be discontinued indefinitely given her excess alcohol abuse and poor oral intake. Stopping amlodipine.  Can assess outpatient if patient needs to resume blood pressure medications  Alcohol abuse Per husband patient has never had withdrawal symptoms or seizures.  However, he doesn't recall a time that the patient has refrained from drinking.   Per chart last CIWA score was 10 (tremors =  5). Patient has been receiving Ativan for her withdrawal symptoms. - CIWA - Thiamine  Tobacco abuse Patient has a 144 pack year smoking history.  She currently smokes 3 packs a day. Advised cessation.   Meningioma s/p resection 2015 Patient did not follow up after brain tumor resection.  On this admission MRI showed no acute intracranial process.    Dispo: Discharge today  [name], MD 07/06/2016, 1:37 PM Pager: [phone]

## 2016-07-06 NOTE — Discharge Summary (Signed)
Name: Kristy Cabrera MRN: [id number] DOB: January 27, 1951 65 y.o. PCP: Pcp Not In System  Date of Admission: 07/04/2016 11:40 AM Date of Discharge: 07/06/2016 Attending Physician: Irene Pap, M.D.  Discharge Diagnosis:  Active Problems:   Acute hyponatremia   Alcohol abuse   Alcohol withdrawal syndrome with complication (HCC)   Acute encephalopathy   Acute hypokalemia   Discharge Medications: Allergies as of 07/06/2016   No Known Allergies     Medication List    STOP taking these medications   amLODipine 10 MG tablet Commonly known as:  NORVASC   hydrochlorothiazide 25 MG tablet Commonly known as:  HYDRODIURIL     TAKE these medications   folic acid A999333 MCG tablet Commonly known as:  FOLVITE Take 400 mcg by mouth daily.   simvastatin 20 MG tablet Commonly known as:  ZOCOR Take 20 mg by mouth daily.   thiamine 100 MG tablet Take 1 tablet (100 mg total) by mouth daily.       Disposition and follow-up:   Ms.Kristy Cabrera was discharged from [facility] in Stable condition.  At the hospital follow up visit please address:  1.  Hyponatremia: Likfely 2/2 beer potomania. Na 116 > 125. No seizure activity. Mental status improved to baseline. Needs close follow up.  2.  Labs / imaging needed at time of follow-up: BMET  3.  Pending labs/ test needing follow-up: None  Follow-up Appointments:   Hospital Course by problem list:  Hyponatremia with altered mental status: Patient presented with slowly progressive confusion and difficulties with ambulation that had worsened over the past several weeks prior to admission. The morning of admission, she became acutely worse with difficulties forming works and could not ambulate which prompted her husband to call EMS. On arrival to the ED, her sodium was found to be 116 and was bolused 1L NS in the ED. Mental status improved though she remained mildly confused. Sodium slowly improved during hospital  course. EEG was negative for any obvious abnormalities though was a technically inadequate study. No seizure activity was ever noted. Has a history of ETOH abuse and suspected beer potomania as the cause of her symptoms. Noted to be on HCTZ as an outpatient. Stopped HCTZ for good given her ETOH abuse. Sodium improved to 125 at time of discharge. Discussed the need for good PO intake, cutting back on the ETOH and close follow up outpatient.  Hypokalemia K 2.2 on admission. Improved with repletion. EKG without any changes. K 4.0 on discharge.   Hypertension BP stable during hospitalization. Patient's home medication includes amlodipine and hydrochlorothiazide. HCTZ has been discontinued on admission and this medication should be discontinued indefinitely given her excess alcohol abuse and poor oral intake. Stopping amlodipine.  Can assess outpatient if patient needs to resume blood pressure medications  Alcohol abuse Counseled on cessation  Tobacco abuse Patient has a 144 pack year smoking history. She currently smokes 3 packs a day. Advised cessation.   Meningioma s/p resection 2015 Patient did not follow up after brain tumor resection.  On this admission MRI showed no acute intracranial process.   Discharge Vitals:   BP (!) 131/48 (BP Location: Left Arm)   Pulse 79   Temp 98.4 F (36.9 C)   Resp 18   Ht 5\' 4"  (1.626 m)   Wt 126 lb 1.7 oz (57.2 kg)   SpO2 98%   BMI 21.65 kg/m    Discharge Instructions: Discharge Instructions    Diet - low sodium heart  healthy    Complete by:  As directed    Increase activity slowly    Complete by:  As directed       Signed: [name], MD 07/08/2016, 9:16 PM   Pager: [phone]

## 2016-07-06 NOTE — Progress Notes (Signed)
Internal Medicine Attending  Date: 07/06/2016  Patient name: Nicole Haymon Medical record number: [id number] Date of birth: January 20, 1951 Age: 65 y.o. Gender: female  I saw and evaluated the patient. I reviewed the resident's note by Dr. Charlynn Grimes and I agree with the resident's findings and plans as documented in his progress note.  When seen on rounds this morning Ms. Farrel Conners was without complaints. She appeared comfortable and not tremulous. Her husband, who is at the bedside, felt she was back at her baseline. Her sodium today was 125. She was eating well. It appears her hyponatremia was related to beer potomania. Extensive education on the importance of weaning off alcohol and improving oral intake was had with the patient and her husband. Her husband plans on making breakfast every morning at this point to encourage oral intake. I believe she is safe for discharge home today with this plan in place. We will also make sure she does not restart the hydrochlorothiazide which likely contributed to the hyponatremia.

## 2016-07-06 NOTE — Progress Notes (Signed)
Clinical Social Worker spoke with patient's nurse Marc Morgans and per conversation with Marc Morgans, patient declined SNF placement.  CSW singing off.  Rhea Pink, MSW,  [place]

## 2016-07-07 NOTE — Care Management Note (Signed)
Case Management Note  Patient Details  Name: Kristy Cabrera MRN: [id number] Date of Birth: 04-01-1951  Subjective/Objective:   Beer potomania, Hypokalemia, HTN                 Action/Plan: Discharge Planning: AVS reviewed: 07/06/2016 NCM spoke to pt and offered choice for [facility]. Pt requested [facility].  07/07/2016 Faxed orders to [facility]. Will follow up on 07/08/2016 to see if agency can accept referral.     Expected Discharge Date:  07/07/2016               Expected Discharge Plan:  [place]  In-House Referral:  NA  Discharge planning Services  CM Consult  Post Acute Care Choice:  Home Health Choice offered to:  Patient  DME Arranged:  N/A DME Agency:  NA  HH Arranged:  PT [place] Agency:  [place] [facility]  Status of Service:  Completed, signed off  If discussed at [place] of Stay Meetings, dates discussed:    Additional Comments:  Erenest Rasher, RN 07/07/2016, 5:38 PM

## 2016-07-11 DIAGNOSIS — J329 Chronic sinusitis, unspecified: Secondary | ICD-10-CM | POA: Diagnosis not present

## 2016-07-11 DIAGNOSIS — R062 Wheezing: Secondary | ICD-10-CM | POA: Diagnosis not present

## 2016-07-11 DIAGNOSIS — R410 Disorientation, unspecified: Secondary | ICD-10-CM | POA: Diagnosis not present

## 2016-07-11 DIAGNOSIS — Z09 Encounter for follow-up examination after completed treatment for conditions other than malignant neoplasm: Secondary | ICD-10-CM | POA: Diagnosis not present

## 2016-07-11 DIAGNOSIS — Z6822 Body mass index (BMI) 22.0-22.9, adult: Secondary | ICD-10-CM | POA: Diagnosis not present

## 2016-08-11 DIAGNOSIS — T148XXA Other injury of unspecified body region, initial encounter: Secondary | ICD-10-CM | POA: Diagnosis not present

## 2016-08-11 DIAGNOSIS — J449 Chronic obstructive pulmonary disease, unspecified: Secondary | ICD-10-CM | POA: Diagnosis not present

## 2016-08-11 DIAGNOSIS — S72142A Displaced intertrochanteric fracture of left femur, initial encounter for closed fracture: Secondary | ICD-10-CM | POA: Diagnosis not present

## 2016-08-11 DIAGNOSIS — Z85841 Personal history of malignant neoplasm of brain: Secondary | ICD-10-CM | POA: Diagnosis not present

## 2016-08-11 DIAGNOSIS — R531 Weakness: Secondary | ICD-10-CM | POA: Diagnosis not present

## 2016-08-11 DIAGNOSIS — W19XXXA Unspecified fall, initial encounter: Secondary | ICD-10-CM | POA: Diagnosis not present

## 2016-08-11 DIAGNOSIS — I10 Essential (primary) hypertension: Secondary | ICD-10-CM | POA: Diagnosis not present

## 2016-08-11 DIAGNOSIS — M25552 Pain in left hip: Secondary | ICD-10-CM | POA: Diagnosis not present

## 2016-08-11 DIAGNOSIS — D62 Acute posthemorrhagic anemia: Secondary | ICD-10-CM | POA: Diagnosis not present

## 2016-08-11 DIAGNOSIS — Z23 Encounter for immunization: Secondary | ICD-10-CM | POA: Diagnosis not present

## 2016-08-11 DIAGNOSIS — Z79899 Other long term (current) drug therapy: Secondary | ICD-10-CM | POA: Diagnosis not present

## 2016-08-11 DIAGNOSIS — F17210 Nicotine dependence, cigarettes, uncomplicated: Secondary | ICD-10-CM | POA: Diagnosis not present

## 2016-08-11 DIAGNOSIS — R079 Chest pain, unspecified: Secondary | ICD-10-CM | POA: Diagnosis not present

## 2016-08-11 DIAGNOSIS — Z720 Tobacco use: Secondary | ICD-10-CM | POA: Diagnosis not present

## 2016-08-11 DIAGNOSIS — F1010 Alcohol abuse, uncomplicated: Secondary | ICD-10-CM | POA: Diagnosis not present

## 2016-08-11 DIAGNOSIS — M25559 Pain in unspecified hip: Secondary | ICD-10-CM | POA: Diagnosis not present

## 2016-08-11 DIAGNOSIS — E559 Vitamin D deficiency, unspecified: Secondary | ICD-10-CM | POA: Diagnosis not present

## 2016-08-12 DIAGNOSIS — S72142A Displaced intertrochanteric fracture of left femur, initial encounter for closed fracture: Secondary | ICD-10-CM | POA: Diagnosis not present

## 2016-08-12 DIAGNOSIS — I10 Essential (primary) hypertension: Secondary | ICD-10-CM | POA: Diagnosis not present

## 2016-08-12 DIAGNOSIS — F17210 Nicotine dependence, cigarettes, uncomplicated: Secondary | ICD-10-CM | POA: Diagnosis not present

## 2016-08-14 DIAGNOSIS — Z720 Tobacco use: Secondary | ICD-10-CM | POA: Diagnosis not present

## 2016-08-14 DIAGNOSIS — S72142A Displaced intertrochanteric fracture of left femur, initial encounter for closed fracture: Secondary | ICD-10-CM | POA: Diagnosis not present

## 2016-08-14 DIAGNOSIS — J449 Chronic obstructive pulmonary disease, unspecified: Secondary | ICD-10-CM | POA: Diagnosis not present

## 2016-08-14 DIAGNOSIS — F1010 Alcohol abuse, uncomplicated: Secondary | ICD-10-CM | POA: Diagnosis not present

## 2016-08-15 DIAGNOSIS — F17210 Nicotine dependence, cigarettes, uncomplicated: Secondary | ICD-10-CM | POA: Diagnosis not present

## 2016-08-15 DIAGNOSIS — Z85841 Personal history of malignant neoplasm of brain: Secondary | ICD-10-CM | POA: Diagnosis not present

## 2016-08-15 DIAGNOSIS — J449 Chronic obstructive pulmonary disease, unspecified: Secondary | ICD-10-CM | POA: Diagnosis not present

## 2016-08-15 DIAGNOSIS — F1010 Alcohol abuse, uncomplicated: Secondary | ICD-10-CM | POA: Diagnosis not present

## 2016-08-15 DIAGNOSIS — E559 Vitamin D deficiency, unspecified: Secondary | ICD-10-CM | POA: Diagnosis not present

## 2016-08-15 DIAGNOSIS — R279 Unspecified lack of coordination: Secondary | ICD-10-CM | POA: Diagnosis not present

## 2016-08-15 DIAGNOSIS — G8918 Other acute postprocedural pain: Secondary | ICD-10-CM | POA: Diagnosis not present

## 2016-08-15 DIAGNOSIS — D62 Acute posthemorrhagic anemia: Secondary | ICD-10-CM | POA: Diagnosis not present

## 2016-08-15 DIAGNOSIS — Z79899 Other long term (current) drug therapy: Secondary | ICD-10-CM | POA: Diagnosis not present

## 2016-08-15 DIAGNOSIS — D649 Anemia, unspecified: Secondary | ICD-10-CM | POA: Diagnosis not present

## 2016-08-15 DIAGNOSIS — M439 Deforming dorsopathy, unspecified: Secondary | ICD-10-CM | POA: Diagnosis not present

## 2016-08-15 DIAGNOSIS — S72142A Displaced intertrochanteric fracture of left femur, initial encounter for closed fracture: Secondary | ICD-10-CM | POA: Diagnosis not present

## 2016-08-15 DIAGNOSIS — R262 Difficulty in walking, not elsewhere classified: Secondary | ICD-10-CM | POA: Diagnosis not present

## 2016-08-15 DIAGNOSIS — Z743 Need for continuous supervision: Secondary | ICD-10-CM | POA: Diagnosis not present

## 2016-08-15 DIAGNOSIS — Z23 Encounter for immunization: Secondary | ICD-10-CM | POA: Diagnosis not present

## 2016-08-15 DIAGNOSIS — W19XXXA Unspecified fall, initial encounter: Secondary | ICD-10-CM | POA: Diagnosis not present

## 2016-08-15 DIAGNOSIS — I10 Essential (primary) hypertension: Secondary | ICD-10-CM | POA: Diagnosis not present

## 2016-08-15 DIAGNOSIS — M25552 Pain in left hip: Secondary | ICD-10-CM | POA: Diagnosis not present

## 2016-08-15 DIAGNOSIS — S72142D Displaced intertrochanteric fracture of left femur, subsequent encounter for closed fracture with routine healing: Secondary | ICD-10-CM | POA: Diagnosis not present

## 2016-08-15 DIAGNOSIS — Z720 Tobacco use: Secondary | ICD-10-CM | POA: Diagnosis not present

## 2016-08-19 DIAGNOSIS — R262 Difficulty in walking, not elsewhere classified: Secondary | ICD-10-CM | POA: Diagnosis not present

## 2016-08-19 DIAGNOSIS — G8918 Other acute postprocedural pain: Secondary | ICD-10-CM | POA: Diagnosis not present

## 2016-08-19 DIAGNOSIS — D649 Anemia, unspecified: Secondary | ICD-10-CM | POA: Diagnosis not present

## 2016-08-19 DIAGNOSIS — M439 Deforming dorsopathy, unspecified: Secondary | ICD-10-CM | POA: Diagnosis not present

## 2016-09-20 DIAGNOSIS — R569 Unspecified convulsions: Secondary | ICD-10-CM | POA: Diagnosis not present

## 2016-09-20 DIAGNOSIS — R4182 Altered mental status, unspecified: Secondary | ICD-10-CM | POA: Diagnosis not present

## 2016-09-20 DIAGNOSIS — R0600 Dyspnea, unspecified: Secondary | ICD-10-CM | POA: Diagnosis not present

## 2016-10-21 DIAGNOSIS — R569 Unspecified convulsions: Secondary | ICD-10-CM | POA: Diagnosis not present

## 2016-10-21 DIAGNOSIS — Z09 Encounter for follow-up examination after completed treatment for conditions other than malignant neoplasm: Secondary | ICD-10-CM | POA: Diagnosis not present

## 2016-11-20 DIAGNOSIS — H40003 Preglaucoma, unspecified, bilateral: Secondary | ICD-10-CM | POA: Diagnosis not present

## 2016-11-20 DIAGNOSIS — H547 Unspecified visual loss: Secondary | ICD-10-CM | POA: Diagnosis not present

## 2016-12-04 DIAGNOSIS — H547 Unspecified visual loss: Secondary | ICD-10-CM | POA: Diagnosis not present

## 2016-12-19 DIAGNOSIS — H5461 Unqualified visual loss, right eye, normal vision left eye: Secondary | ICD-10-CM | POA: Diagnosis not present

## 2016-12-19 DIAGNOSIS — Z681 Body mass index (BMI) 19 or less, adult: Secondary | ICD-10-CM | POA: Diagnosis not present

## 2016-12-30 DIAGNOSIS — H5461 Unqualified visual loss, right eye, normal vision left eye: Secondary | ICD-10-CM | POA: Diagnosis not present

## 2017-01-14 DIAGNOSIS — R197 Diarrhea, unspecified: Secondary | ICD-10-CM | POA: Diagnosis not present

## 2017-01-15 DIAGNOSIS — R197 Diarrhea, unspecified: Secondary | ICD-10-CM | POA: Diagnosis not present

## 2017-03-18 DIAGNOSIS — I10 Essential (primary) hypertension: Secondary | ICD-10-CM | POA: Diagnosis not present

## 2017-03-18 DIAGNOSIS — Z6826 Body mass index (BMI) 26.0-26.9, adult: Secondary | ICD-10-CM | POA: Diagnosis not present

## 2017-03-18 DIAGNOSIS — E7800 Pure hypercholesterolemia, unspecified: Secondary | ICD-10-CM | POA: Diagnosis not present

## 2017-03-18 DIAGNOSIS — R899 Unspecified abnormal finding in specimens from other organs, systems and tissues: Secondary | ICD-10-CM | POA: Diagnosis not present

## 2017-03-18 DIAGNOSIS — R197 Diarrhea, unspecified: Secondary | ICD-10-CM | POA: Diagnosis not present

## 2017-04-10 DIAGNOSIS — Z0000 Encounter for general adult medical examination without abnormal findings: Secondary | ICD-10-CM | POA: Diagnosis not present

## 2017-04-10 DIAGNOSIS — N959 Unspecified menopausal and perimenopausal disorder: Secondary | ICD-10-CM | POA: Diagnosis not present

## 2017-04-10 DIAGNOSIS — E785 Hyperlipidemia, unspecified: Secondary | ICD-10-CM | POA: Diagnosis not present

## 2017-04-10 DIAGNOSIS — Z9181 History of falling: Secondary | ICD-10-CM | POA: Diagnosis not present

## 2017-04-10 DIAGNOSIS — Z1389 Encounter for screening for other disorder: Secondary | ICD-10-CM | POA: Diagnosis not present

## 2017-04-10 DIAGNOSIS — Z1231 Encounter for screening mammogram for malignant neoplasm of breast: Secondary | ICD-10-CM | POA: Diagnosis not present

## 2017-04-10 DIAGNOSIS — Z136 Encounter for screening for cardiovascular disorders: Secondary | ICD-10-CM | POA: Diagnosis not present

## 2017-05-27 DIAGNOSIS — H40003 Preglaucoma, unspecified, bilateral: Secondary | ICD-10-CM | POA: Diagnosis not present

## 2017-07-30 DIAGNOSIS — Z6826 Body mass index (BMI) 26.0-26.9, adult: Secondary | ICD-10-CM | POA: Diagnosis not present

## 2017-07-30 DIAGNOSIS — R197 Diarrhea, unspecified: Secondary | ICD-10-CM | POA: Diagnosis not present

## 2017-07-30 DIAGNOSIS — H409 Unspecified glaucoma: Secondary | ICD-10-CM | POA: Diagnosis not present

## 2017-07-30 DIAGNOSIS — I10 Essential (primary) hypertension: Secondary | ICD-10-CM | POA: Diagnosis not present

## 2017-07-30 DIAGNOSIS — E785 Hyperlipidemia, unspecified: Secondary | ICD-10-CM | POA: Diagnosis not present

## 2017-10-17 DIAGNOSIS — H409 Unspecified glaucoma: Secondary | ICD-10-CM | POA: Diagnosis not present

## 2017-10-17 DIAGNOSIS — R413 Other amnesia: Secondary | ICD-10-CM | POA: Diagnosis not present

## 2017-10-17 DIAGNOSIS — F1010 Alcohol abuse, uncomplicated: Secondary | ICD-10-CM | POA: Diagnosis not present

## 2017-10-17 DIAGNOSIS — J449 Chronic obstructive pulmonary disease, unspecified: Secondary | ICD-10-CM | POA: Diagnosis not present

## 2017-10-17 DIAGNOSIS — G40909 Epilepsy, unspecified, not intractable, without status epilepticus: Secondary | ICD-10-CM | POA: Diagnosis not present

## 2017-10-17 DIAGNOSIS — Z87891 Personal history of nicotine dependence: Secondary | ICD-10-CM | POA: Diagnosis not present

## 2017-10-17 DIAGNOSIS — Z86011 Personal history of benign neoplasm of the brain: Secondary | ICD-10-CM | POA: Diagnosis not present

## 2017-10-17 DIAGNOSIS — M810 Age-related osteoporosis without current pathological fracture: Secondary | ICD-10-CM | POA: Diagnosis not present

## 2017-10-17 DIAGNOSIS — I10 Essential (primary) hypertension: Secondary | ICD-10-CM | POA: Diagnosis not present

## 2017-10-17 DIAGNOSIS — L989 Disorder of the skin and subcutaneous tissue, unspecified: Secondary | ICD-10-CM | POA: Diagnosis not present

## 2017-11-12 DIAGNOSIS — Z6825 Body mass index (BMI) 25.0-25.9, adult: Secondary | ICD-10-CM | POA: Diagnosis not present

## 2017-11-12 DIAGNOSIS — E7800 Pure hypercholesterolemia, unspecified: Secondary | ICD-10-CM | POA: Diagnosis not present

## 2017-11-12 DIAGNOSIS — S0990XA Unspecified injury of head, initial encounter: Secondary | ICD-10-CM | POA: Diagnosis not present

## 2017-11-12 DIAGNOSIS — I10 Essential (primary) hypertension: Secondary | ICD-10-CM | POA: Diagnosis not present

## 2018-01-19 ENCOUNTER — Encounter: Payer: Self-pay | Admitting: *Deleted

## 2018-01-20 ENCOUNTER — Encounter: Payer: Self-pay | Admitting: Diagnostic Neuroimaging

## 2018-01-20 ENCOUNTER — Ambulatory Visit (INDEPENDENT_AMBULATORY_CARE_PROVIDER_SITE_OTHER): Payer: PPO | Admitting: Diagnostic Neuroimaging

## 2018-01-20 VITALS — BP 124/61 | HR 49 | Ht 64.0 in | Wt 151.2 lb

## 2018-01-20 DIAGNOSIS — F1027 Alcohol dependence with alcohol-induced persisting dementia: Secondary | ICD-10-CM | POA: Diagnosis not present

## 2018-01-20 DIAGNOSIS — Z87898 Personal history of other specified conditions: Secondary | ICD-10-CM | POA: Diagnosis not present

## 2018-01-20 DIAGNOSIS — D329 Benign neoplasm of meninges, unspecified: Secondary | ICD-10-CM | POA: Diagnosis not present

## 2018-01-20 DIAGNOSIS — F0390 Unspecified dementia without behavioral disturbance: Secondary | ICD-10-CM

## 2018-01-20 DIAGNOSIS — F1011 Alcohol abuse, in remission: Secondary | ICD-10-CM

## 2018-01-20 DIAGNOSIS — F070 Personality change due to known physiological condition: Secondary | ICD-10-CM

## 2018-01-20 DIAGNOSIS — F03A0 Unspecified dementia, mild, without behavioral disturbance, psychotic disturbance, mood disturbance, and anxiety: Secondary | ICD-10-CM

## 2018-01-20 NOTE — Patient Instructions (Signed)
-   safety / supervision issues reviewed - caregiver resources provided; [email or web address] website and packet of information - continue donepezil; may consider adding on memantine - optimize nutrition and physical activity

## 2018-01-20 NOTE — Progress Notes (Signed)
[facility]  PATIENT: Kristy Cabrera DOB: 1951-03-20  REFERRING CLINICIAN: Ericka Pontiff, NP HISTORY FROM: patient and husband  REASON FOR VISIT: new consult    HISTORICAL  CHIEF COMPLAINT:  Chief Complaint  Patient presents with  . NP  Jeanie Sewer, FNP  . Dementia    2015, [facility] brain surgery (tumor),  WFB (noted loss of smell, loss vision R eye, then sx, broke L hip, progressive memory loss     HISTORY OF PRESENT ILLNESS:   67 year old female here for evaluation of memory loss.  2013 patient had personality changes, stop paying bills, was having withdrawn affect.  Patient also was drinking alcohol heavily at this time.  At some point patient had neuro imaging study which demonstrated a large frontal interhemispheric meningioma.  She underwent meningioma resection in 2015.  Unfortunately her cognitive and personality symptoms did not improve.  Patient continued to drink heavily at this time.  2017 patient progressively worsened in terms of memory.  Patient was admitted to the hospital in 2017 for acute encephalopathy, likely related to hyponatremia, alcohol abuse and alcohol withdrawal.  Memory loss cognitive decline have continued.  Earlier in 2019 patient significant cut down and drinking.  Now patient drinks alcohol 1 drink per day, 10 days out of the month.  Unfortunately memory loss problems have continued.   REVIEW OF SYSTEMS: Full 14 system review of systems performed and negative with exception of: Memory loss confusion sleepiness slurred speech depression decreased energy hearing loss weight gain loss of vision blurred vision.  ALLERGIES: No Known Allergies  HOME MEDICATIONS: Outpatient Medications Prior to Visit  Medication Sig Dispense Refill  . atenolol (TENORMIN) 100 MG tablet Take 100 mg by mouth.    . latanoprost (XALATAN) 0.005 % ophthalmic solution Place 1 drop into both eyes at bedtime.    . levETIRAcetam (KEPPRA) 500 MG tablet Take  500 mg by mouth.    . simvastatin (ZOCOR) 20 MG tablet Take 20 mg by mouth daily.    Marland Kitchen b complex vitamins tablet Take 2 tablets by mouth daily.    . folic acid (FOLVITE) 782 MCG tablet Take 400 mcg by mouth daily.    Marland Kitchen Hyoscyamine Sulfate 0.375 MG TBCR Take 1 tablet by mouth every 8 (eight) hours.    . thiamine 100 MG tablet Take 1 tablet (100 mg total) by mouth daily. 30 tablet 0   No facility-administered medications prior to visit.     PAST MEDICAL HISTORY: Past Medical History:  Diagnosis Date  . Brain tumor (benign) ([place])   . Episodic mood disorder ([place])   . Hyperlipidemia   . Hypertension   . Insomnia   . Osteopenia   . Weakness of both legs     PAST SURGICAL HISTORY: Past Surgical History:  Procedure Laterality Date  . Brain tumor removal    . MELANOMA EXCISION  1987   BACK    FAMILY HISTORY: Family History  Problem Relation Age of Onset  . Diabetes Mother   . Breast cancer Mother   . Hypertension Mother   . Kidney disease Mother   . Arthritis Mother   . Brain cancer Mother   . Diabetes Sister   . Hypertension Sister   . Diabetes Brother   . Hypertension Brother   . Diabetes Maternal Grandmother   . Hypertension Maternal Grandmother   . Heart disease Maternal Grandmother   . Asthma Maternal Grandfather     SOCIAL HISTORY:  Social History   Socioeconomic History  .  Marital status: Married    Spouse name: Not on file  . Number of children: Not on file  . Years of education: Not on file  . Highest education level: Not on file  Occupational History  . Not on file  Social Needs  . Financial resource strain: Not on file  . Food insecurity:    Worry: Not on file    Inability: Not on file  . Transportation needs:    Medical: Not on file    Non-medical: Not on file  Tobacco Use  . Smoking status: Former Smoker    Packs/day: 3.00    Types: Cigarettes    Last attempt to quit: 08/22/2014    Years since quitting: 3.4  . Smokeless tobacco: Never Used    Substance and Sexual Activity  . Alcohol use: Not Currently    Comment: , quit 07/2014  . Drug use: No  . Sexual activity: Not on file  Lifestyle  . Physical activity:    Days per week: Not on file    Minutes per session: Not on file  . Stress: Not on file  Relationships  . Social connections:    Talks on phone: Not on file    Gets together: Not on file    Attends religious service: Not on file    Active member of club or organization: Not on file    Attends meetings of clubs or organizations: Not on file    Relationship status: Not on file  . Intimate partner violence:    Fear of current or ex partner: Not on file    Emotionally abused: Not on file    Physically abused: Not on file    Forced sexual activity: Not on file  Other Topics Concern  . Not on file  Social History Narrative   Caffeine 4 servings daily.  Lives home with husband, [name].  Retired.       PHYSICAL EXAM  GENERAL EXAM/CONSTITUTIONAL: Vitals:  Vitals:   01/20/18 1343  BP: 124/61  Pulse: (!) 49  Weight: 151 lb 3.2 oz (68.6 kg)  Height: 5\' 4"  (1.626 m)     Body mass index is 25.95 kg/m.  No exam data present  Patient is in no distress; well developed, nourished and groomed; neck is supple  CARDIOVASCULAR:  Examination of carotid arteries is normal; no carotid bruits  Regular rate and rhythm, no murmurs  Examination of peripheral vascular system by observation and palpation is normal  EYES:  Ophthalmoscopic exam of optic discs and posterior segments is normal; no papilledema or hemorrhages  MUSCULOSKELETAL:  Gait, strength, tone, movements noted in Neurologic exam below  NEUROLOGIC: MENTAL STATUS:  MMSE - Mini Mental State Exam 01/20/2018  Orientation to time 5  Orientation to Place 2  Registration 3  Attention/ Calculation 3  Recall 3  Language- name 2 objects 2  Language- repeat 1  Language- follow 3 step command 2  Language- read & follow direction 1  Write a sentence 1   Copy design 1  Total score 24    awake, alert, oriented to person and time; NOT PLACE  DECR MEMORY  DECR attention and concentration  language fluent, comprehension intact, naming intact,   fund of knowledge appropriate  CRANIAL NERVE:   2nd - no papilledema on fundoscopic exam  2nd, 3rd, 4th, 6th - pupils equal and reactive to light, visual fields full to confrontation, extraocular muscles intact, no nystagmus  5th - facial sensation symmetric  7th - facial  strength symmetric  8th - hearing intact  9th - palate elevates symmetrically, uvula midline  11th - shoulder shrug symmetric  12th - tongue protrusion midline  MILD DYSARTHRIA  MOTOR:   normal bulk and tone, DIFFUSE 4+ IN BUE, BLE  SENSORY:   normal and symmetric to light touch, temperature, vibration  COORDINATION:   finger-nose-finger, fine finger movements normal  REFLEXES:   deep tendon reflexes TRACE and symmetric  GAIT/STATION:   UNSTEADY GAIT; USES WALKER    DIAGNOSTIC DATA (LABS, IMAGING, TESTING) - I reviewed patient records, labs, notes, testing and imaging myself where available.  Lab Results  Component Value Date   WBC 8.9 07/06/2016   HGB 9.9 (L) 07/06/2016   HCT 28.0 (L) 07/06/2016   MCV 88.3 07/06/2016   PLT 289 07/06/2016      Component Value Date/Time   NA 125 (L) 07/06/2016 0908   K 4.1 07/06/2016 0908   CL 94 (L) 07/06/2016 0908   CO2 23 07/06/2016 0908   GLUCOSE 97 07/06/2016 0908   BUN <5 (L) 07/06/2016 0908   CREATININE 0.49 07/06/2016 0908   CALCIUM 8.8 (L) 07/06/2016 0908   PROT 6.8 07/04/2016 1147   ALBUMIN 3.7 07/04/2016 1147   AST 54 (H) 07/04/2016 1147   ALT 18 07/04/2016 1147   ALKPHOS 115 07/04/2016 1147   BILITOT 1.4 (H) 07/04/2016 1147   GFRNONAA >60 07/06/2016 0908   GFRAA >60 07/06/2016 0908   No results found for: CHOL, HDL, LDLCALC, LDLDIRECT, TRIG, CHOLHDL No results found for: HGBA1C No results found for: VITAMINB12 Lab Results   Component Value Date   TSH 0.571 07/04/2016    07/04/16 MRI brain [I reviewed images myself and agree with interpretation. -VRP]  1. No acute intracranial process identified. 2. Bifrontal encephalomalacia and gliosis, suspected to be postoperative in nature related to prior meningioma resection. No residual enhancing tumor identified on this motion degraded study.  3. Mild chronic microvascular ischemic disease. 4. Chronic right maxillary and ethmoidal sinus disease.     ASSESSMENT AND PLAN  67 y.o. year old female here with history of personality and cognitive decline in 2013, likely related to underlying alcohol abuse and large interhemispheric frontal meningioma.  Patient continues to have progressive memory loss issues.  Now may also have superimposed neurodegenerative dementia.   Dx:  1. Mild dementia   2. 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 History of alcohol abuse   4. Meningioma ([place])   5. Frontal lobe syndrome      PLAN:  - safety / supervision issues reviewed - caregiver resources provided; [email or web address] website and packet of information - continue donepezil; may consider adding on memantine - optimize nutrition and physical activity  Return if symptoms worsen or fail to improve, for return to PCP.    Penni Bombard, MD 11/19/256, 5:27 PM Certified in Neurology, Neurophysiology and Neuroimaging  [facility] [address] [place], [place] [address] [phone]

## 2018-01-21 ENCOUNTER — Encounter: Payer: Self-pay | Admitting: Diagnostic Neuroimaging

## 2018-01-27 DIAGNOSIS — R569 Unspecified convulsions: Secondary | ICD-10-CM | POA: Diagnosis not present

## 2018-01-27 DIAGNOSIS — E785 Hyperlipidemia, unspecified: Secondary | ICD-10-CM | POA: Diagnosis not present

## 2018-01-27 DIAGNOSIS — S0990XS Unspecified injury of head, sequela: Secondary | ICD-10-CM | POA: Diagnosis not present

## 2018-01-27 DIAGNOSIS — Z6825 Body mass index (BMI) 25.0-25.9, adult: Secondary | ICD-10-CM | POA: Diagnosis not present

## 2018-01-27 DIAGNOSIS — Z1211 Encounter for screening for malignant neoplasm of colon: Secondary | ICD-10-CM | POA: Diagnosis not present

## 2018-01-27 DIAGNOSIS — D229 Melanocytic nevi, unspecified: Secondary | ICD-10-CM | POA: Diagnosis not present

## 2018-01-27 DIAGNOSIS — R197 Diarrhea, unspecified: Secondary | ICD-10-CM | POA: Diagnosis not present

## 2018-01-27 DIAGNOSIS — I10 Essential (primary) hypertension: Secondary | ICD-10-CM | POA: Diagnosis not present

## 2018-03-10 DIAGNOSIS — L821 Other seborrheic keratosis: Secondary | ICD-10-CM | POA: Diagnosis not present

## 2018-03-10 DIAGNOSIS — C44729 Squamous cell carcinoma of skin of left lower limb, including hip: Secondary | ICD-10-CM | POA: Diagnosis not present

## 2018-04-16 DIAGNOSIS — R2689 Other abnormalities of gait and mobility: Secondary | ICD-10-CM | POA: Diagnosis not present

## 2018-04-16 DIAGNOSIS — Z1239 Encounter for other screening for malignant neoplasm of breast: Secondary | ICD-10-CM | POA: Diagnosis not present

## 2018-04-16 DIAGNOSIS — Z1339 Encounter for screening examination for other mental health and behavioral disorders: Secondary | ICD-10-CM | POA: Diagnosis not present

## 2018-04-16 DIAGNOSIS — N959 Unspecified menopausal and perimenopausal disorder: Secondary | ICD-10-CM | POA: Diagnosis not present

## 2018-04-16 DIAGNOSIS — Z139 Encounter for screening, unspecified: Secondary | ICD-10-CM | POA: Diagnosis not present

## 2018-04-16 DIAGNOSIS — Z136 Encounter for screening for cardiovascular disorders: Secondary | ICD-10-CM | POA: Diagnosis not present

## 2018-04-16 DIAGNOSIS — Z1331 Encounter for screening for depression: Secondary | ICD-10-CM | POA: Diagnosis not present

## 2018-04-16 DIAGNOSIS — Z9181 History of falling: Secondary | ICD-10-CM | POA: Diagnosis not present

## 2018-04-16 DIAGNOSIS — E785 Hyperlipidemia, unspecified: Secondary | ICD-10-CM | POA: Diagnosis not present

## 2018-04-16 DIAGNOSIS — Z0000 Encounter for general adult medical examination without abnormal findings: Secondary | ICD-10-CM | POA: Diagnosis not present

## 2018-04-22 DIAGNOSIS — R2689 Other abnormalities of gait and mobility: Secondary | ICD-10-CM | POA: Diagnosis not present

## 2018-07-18 DIAGNOSIS — Z85841 Personal history of malignant neoplasm of brain: Secondary | ICD-10-CM | POA: Diagnosis not present

## 2018-07-18 DIAGNOSIS — R000 Tachycardia, unspecified: Secondary | ICD-10-CM | POA: Diagnosis not present

## 2018-07-18 DIAGNOSIS — N390 Urinary tract infection, site not specified: Secondary | ICD-10-CM | POA: Diagnosis not present

## 2018-07-18 DIAGNOSIS — E876 Hypokalemia: Secondary | ICD-10-CM | POA: Diagnosis not present

## 2018-07-18 DIAGNOSIS — Z720 Tobacco use: Secondary | ICD-10-CM

## 2018-07-18 DIAGNOSIS — I10 Essential (primary) hypertension: Secondary | ICD-10-CM | POA: Diagnosis not present

## 2018-07-18 DIAGNOSIS — I214 Non-ST elevation (NSTEMI) myocardial infarction: Secondary | ICD-10-CM | POA: Diagnosis not present

## 2018-07-18 DIAGNOSIS — G459 Transient cerebral ischemic attack, unspecified: Secondary | ICD-10-CM | POA: Diagnosis not present

## 2018-07-18 DIAGNOSIS — R404 Transient alteration of awareness: Secondary | ICD-10-CM | POA: Diagnosis not present

## 2018-07-18 DIAGNOSIS — J449 Chronic obstructive pulmonary disease, unspecified: Secondary | ICD-10-CM

## 2018-07-18 DIAGNOSIS — R079 Chest pain, unspecified: Secondary | ICD-10-CM | POA: Diagnosis not present

## 2018-07-18 DIAGNOSIS — F0390 Unspecified dementia without behavioral disturbance: Secondary | ICD-10-CM | POA: Diagnosis not present

## 2018-07-18 DIAGNOSIS — F17210 Nicotine dependence, cigarettes, uncomplicated: Secondary | ICD-10-CM | POA: Diagnosis not present

## 2018-07-18 DIAGNOSIS — R569 Unspecified convulsions: Secondary | ICD-10-CM

## 2018-07-18 DIAGNOSIS — R4020 Unspecified coma: Secondary | ICD-10-CM | POA: Diagnosis not present

## 2018-07-18 DIAGNOSIS — R739 Hyperglycemia, unspecified: Secondary | ICD-10-CM | POA: Diagnosis not present

## 2018-07-18 DIAGNOSIS — F1010 Alcohol abuse, uncomplicated: Secondary | ICD-10-CM

## 2018-07-18 DIAGNOSIS — Z79899 Other long term (current) drug therapy: Secondary | ICD-10-CM | POA: Diagnosis not present

## 2018-07-18 DIAGNOSIS — G4089 Other seizures: Secondary | ICD-10-CM | POA: Diagnosis not present

## 2018-07-18 DIAGNOSIS — I6350 Cerebral infarction due to unspecified occlusion or stenosis of unspecified cerebral artery: Secondary | ICD-10-CM | POA: Diagnosis not present

## 2018-07-19 DIAGNOSIS — I361 Nonrheumatic tricuspid (valve) insufficiency: Secondary | ICD-10-CM | POA: Diagnosis not present

## 2018-07-19 DIAGNOSIS — R569 Unspecified convulsions: Secondary | ICD-10-CM | POA: Diagnosis not present

## 2018-07-19 DIAGNOSIS — Z720 Tobacco use: Secondary | ICD-10-CM | POA: Diagnosis not present

## 2018-07-19 DIAGNOSIS — R7989 Other specified abnormal findings of blood chemistry: Secondary | ICD-10-CM | POA: Diagnosis not present

## 2018-07-19 DIAGNOSIS — I10 Essential (primary) hypertension: Secondary | ICD-10-CM | POA: Diagnosis not present

## 2018-07-19 DIAGNOSIS — F1010 Alcohol abuse, uncomplicated: Secondary | ICD-10-CM | POA: Diagnosis not present

## 2018-07-19 DIAGNOSIS — J449 Chronic obstructive pulmonary disease, unspecified: Secondary | ICD-10-CM | POA: Diagnosis not present

## 2018-07-19 DIAGNOSIS — I351 Nonrheumatic aortic (valve) insufficiency: Secondary | ICD-10-CM | POA: Diagnosis not present

## 2018-07-20 DIAGNOSIS — Z720 Tobacco use: Secondary | ICD-10-CM | POA: Diagnosis not present

## 2018-07-20 DIAGNOSIS — R569 Unspecified convulsions: Secondary | ICD-10-CM | POA: Diagnosis not present

## 2018-07-20 DIAGNOSIS — J449 Chronic obstructive pulmonary disease, unspecified: Secondary | ICD-10-CM | POA: Diagnosis not present

## 2018-07-20 DIAGNOSIS — F1010 Alcohol abuse, uncomplicated: Secondary | ICD-10-CM | POA: Diagnosis not present

## 2018-07-20 DIAGNOSIS — R7989 Other specified abnormal findings of blood chemistry: Secondary | ICD-10-CM | POA: Diagnosis not present

## 2018-07-29 DIAGNOSIS — N390 Urinary tract infection, site not specified: Secondary | ICD-10-CM | POA: Diagnosis not present

## 2018-07-29 DIAGNOSIS — Z87891 Personal history of nicotine dependence: Secondary | ICD-10-CM | POA: Diagnosis not present

## 2018-07-29 DIAGNOSIS — M8580 Other specified disorders of bone density and structure, unspecified site: Secondary | ICD-10-CM | POA: Diagnosis not present

## 2018-07-29 DIAGNOSIS — J449 Chronic obstructive pulmonary disease, unspecified: Secondary | ICD-10-CM | POA: Diagnosis not present

## 2018-07-29 DIAGNOSIS — B373 Candidiasis of vulva and vagina: Secondary | ICD-10-CM | POA: Diagnosis not present

## 2018-07-29 DIAGNOSIS — Z86011 Personal history of benign neoplasm of the brain: Secondary | ICD-10-CM | POA: Diagnosis not present

## 2018-07-29 DIAGNOSIS — Z6827 Body mass index (BMI) 27.0-27.9, adult: Secondary | ICD-10-CM | POA: Diagnosis not present

## 2018-07-29 DIAGNOSIS — F0390 Unspecified dementia without behavioral disturbance: Secondary | ICD-10-CM | POA: Diagnosis not present

## 2018-07-29 DIAGNOSIS — F1010 Alcohol abuse, uncomplicated: Secondary | ICD-10-CM | POA: Diagnosis not present

## 2018-07-29 DIAGNOSIS — Z79899 Other long term (current) drug therapy: Secondary | ICD-10-CM | POA: Diagnosis not present

## 2018-07-29 DIAGNOSIS — H409 Unspecified glaucoma: Secondary | ICD-10-CM | POA: Diagnosis not present

## 2018-07-29 DIAGNOSIS — E785 Hyperlipidemia, unspecified: Secondary | ICD-10-CM | POA: Diagnosis not present

## 2018-07-29 DIAGNOSIS — M810 Age-related osteoporosis without current pathological fracture: Secondary | ICD-10-CM | POA: Diagnosis not present

## 2018-07-29 DIAGNOSIS — Z9181 History of falling: Secondary | ICD-10-CM | POA: Diagnosis not present

## 2018-07-29 DIAGNOSIS — G40909 Epilepsy, unspecified, not intractable, without status epilepticus: Secondary | ICD-10-CM | POA: Diagnosis not present

## 2018-07-29 DIAGNOSIS — S0990XS Unspecified injury of head, sequela: Secondary | ICD-10-CM | POA: Diagnosis not present

## 2018-07-29 DIAGNOSIS — I10 Essential (primary) hypertension: Secondary | ICD-10-CM | POA: Diagnosis not present

## 2018-07-29 DIAGNOSIS — E7800 Pure hypercholesterolemia, unspecified: Secondary | ICD-10-CM | POA: Diagnosis not present

## 2018-07-29 DIAGNOSIS — G4700 Insomnia, unspecified: Secondary | ICD-10-CM | POA: Diagnosis not present

## 2018-07-29 DIAGNOSIS — Z09 Encounter for follow-up examination after completed treatment for conditions other than malignant neoplasm: Secondary | ICD-10-CM | POA: Diagnosis not present

## 2018-07-29 DIAGNOSIS — R569 Unspecified convulsions: Secondary | ICD-10-CM | POA: Diagnosis not present

## 2018-07-31 DIAGNOSIS — R300 Dysuria: Secondary | ICD-10-CM | POA: Diagnosis not present

## 2018-08-06 DIAGNOSIS — D729 Disorder of white blood cells, unspecified: Secondary | ICD-10-CM | POA: Diagnosis not present

## 2018-08-28 DIAGNOSIS — F0390 Unspecified dementia without behavioral disturbance: Secondary | ICD-10-CM | POA: Diagnosis not present

## 2018-08-28 DIAGNOSIS — M810 Age-related osteoporosis without current pathological fracture: Secondary | ICD-10-CM | POA: Diagnosis not present

## 2018-08-28 DIAGNOSIS — G4700 Insomnia, unspecified: Secondary | ICD-10-CM | POA: Diagnosis not present

## 2018-08-28 DIAGNOSIS — Z86011 Personal history of benign neoplasm of the brain: Secondary | ICD-10-CM | POA: Diagnosis not present

## 2018-08-28 DIAGNOSIS — E7800 Pure hypercholesterolemia, unspecified: Secondary | ICD-10-CM | POA: Diagnosis not present

## 2018-08-28 DIAGNOSIS — I10 Essential (primary) hypertension: Secondary | ICD-10-CM | POA: Diagnosis not present

## 2018-08-28 DIAGNOSIS — H409 Unspecified glaucoma: Secondary | ICD-10-CM | POA: Diagnosis not present

## 2018-08-28 DIAGNOSIS — F1010 Alcohol abuse, uncomplicated: Secondary | ICD-10-CM | POA: Diagnosis not present

## 2018-08-28 DIAGNOSIS — G40909 Epilepsy, unspecified, not intractable, without status epilepticus: Secondary | ICD-10-CM | POA: Diagnosis not present

## 2018-08-28 DIAGNOSIS — E785 Hyperlipidemia, unspecified: Secondary | ICD-10-CM | POA: Diagnosis not present

## 2018-08-28 DIAGNOSIS — J449 Chronic obstructive pulmonary disease, unspecified: Secondary | ICD-10-CM | POA: Diagnosis not present

## 2018-08-28 DIAGNOSIS — Z9181 History of falling: Secondary | ICD-10-CM | POA: Diagnosis not present

## 2018-08-28 DIAGNOSIS — M8580 Other specified disorders of bone density and structure, unspecified site: Secondary | ICD-10-CM | POA: Diagnosis not present

## 2018-08-28 DIAGNOSIS — Z87891 Personal history of nicotine dependence: Secondary | ICD-10-CM | POA: Diagnosis not present

## 2018-09-22 DIAGNOSIS — G459 Transient cerebral ischemic attack, unspecified: Secondary | ICD-10-CM | POA: Diagnosis not present

## 2018-09-22 DIAGNOSIS — G9341 Metabolic encephalopathy: Secondary | ICD-10-CM | POA: Diagnosis not present

## 2018-09-22 DIAGNOSIS — R0902 Hypoxemia: Secondary | ICD-10-CM | POA: Diagnosis not present

## 2018-09-22 DIAGNOSIS — R079 Chest pain, unspecified: Secondary | ICD-10-CM | POA: Diagnosis not present

## 2018-09-22 DIAGNOSIS — R531 Weakness: Secondary | ICD-10-CM | POA: Diagnosis not present

## 2018-09-22 DIAGNOSIS — I10 Essential (primary) hypertension: Secondary | ICD-10-CM | POA: Diagnosis not present

## 2018-09-22 DIAGNOSIS — R569 Unspecified convulsions: Secondary | ICD-10-CM | POA: Diagnosis not present

## 2018-09-22 DIAGNOSIS — R4182 Altered mental status, unspecified: Secondary | ICD-10-CM | POA: Diagnosis not present

## 2018-09-22 DIAGNOSIS — E860 Dehydration: Secondary | ICD-10-CM | POA: Diagnosis not present

## 2018-09-22 DIAGNOSIS — R29810 Facial weakness: Secondary | ICD-10-CM | POA: Diagnosis not present

## 2018-09-22 DIAGNOSIS — G9340 Encephalopathy, unspecified: Secondary | ICD-10-CM | POA: Diagnosis not present

## 2018-09-22 DIAGNOSIS — D72829 Elevated white blood cell count, unspecified: Secondary | ICD-10-CM | POA: Diagnosis not present

## 2018-09-22 DIAGNOSIS — E1165 Type 2 diabetes mellitus with hyperglycemia: Secondary | ICD-10-CM | POA: Diagnosis not present

## 2018-09-23 DIAGNOSIS — J449 Chronic obstructive pulmonary disease, unspecified: Secondary | ICD-10-CM | POA: Diagnosis not present

## 2018-09-23 DIAGNOSIS — D72829 Elevated white blood cell count, unspecified: Secondary | ICD-10-CM | POA: Diagnosis not present

## 2018-09-23 DIAGNOSIS — Z79899 Other long term (current) drug therapy: Secondary | ICD-10-CM | POA: Diagnosis not present

## 2018-09-23 DIAGNOSIS — Z85841 Personal history of malignant neoplasm of brain: Secondary | ICD-10-CM | POA: Diagnosis not present

## 2018-09-23 DIAGNOSIS — G40909 Epilepsy, unspecified, not intractable, without status epilepticus: Secondary | ICD-10-CM | POA: Diagnosis not present

## 2018-09-23 DIAGNOSIS — E785 Hyperlipidemia, unspecified: Secondary | ICD-10-CM | POA: Diagnosis not present

## 2018-09-23 DIAGNOSIS — E860 Dehydration: Secondary | ICD-10-CM | POA: Diagnosis not present

## 2018-09-23 DIAGNOSIS — M6281 Muscle weakness (generalized): Secondary | ICD-10-CM | POA: Diagnosis not present

## 2018-09-23 DIAGNOSIS — Z7401 Bed confinement status: Secondary | ICD-10-CM | POA: Diagnosis not present

## 2018-09-23 DIAGNOSIS — Z66 Do not resuscitate: Secondary | ICD-10-CM | POA: Diagnosis not present

## 2018-09-23 DIAGNOSIS — I951 Orthostatic hypotension: Secondary | ICD-10-CM | POA: Diagnosis not present

## 2018-09-23 DIAGNOSIS — R531 Weakness: Secondary | ICD-10-CM | POA: Diagnosis not present

## 2018-09-23 DIAGNOSIS — J441 Chronic obstructive pulmonary disease with (acute) exacerbation: Secondary | ICD-10-CM | POA: Diagnosis not present

## 2018-09-23 DIAGNOSIS — Z7982 Long term (current) use of aspirin: Secondary | ICD-10-CM | POA: Diagnosis not present

## 2018-09-23 DIAGNOSIS — F0390 Unspecified dementia without behavioral disturbance: Secondary | ICD-10-CM | POA: Diagnosis not present

## 2018-09-23 DIAGNOSIS — D320 Benign neoplasm of cerebral meninges: Secondary | ICD-10-CM | POA: Diagnosis not present

## 2018-09-23 DIAGNOSIS — M2550 Pain in unspecified joint: Secondary | ICD-10-CM | POA: Diagnosis not present

## 2018-09-23 DIAGNOSIS — R4781 Slurred speech: Secondary | ICD-10-CM | POA: Diagnosis not present

## 2018-09-23 DIAGNOSIS — I6381 Other cerebral infarction due to occlusion or stenosis of small artery: Secondary | ICD-10-CM | POA: Diagnosis not present

## 2018-09-23 DIAGNOSIS — R079 Chest pain, unspecified: Secondary | ICD-10-CM | POA: Diagnosis not present

## 2018-09-23 DIAGNOSIS — G9340 Encephalopathy, unspecified: Secondary | ICD-10-CM | POA: Diagnosis not present

## 2018-09-23 DIAGNOSIS — I639 Cerebral infarction, unspecified: Secondary | ICD-10-CM | POA: Diagnosis not present

## 2018-09-23 DIAGNOSIS — I10 Essential (primary) hypertension: Secondary | ICD-10-CM | POA: Diagnosis not present

## 2018-09-23 DIAGNOSIS — G4089 Other seizures: Secondary | ICD-10-CM | POA: Diagnosis not present

## 2018-09-23 DIAGNOSIS — R4182 Altered mental status, unspecified: Secondary | ICD-10-CM | POA: Diagnosis not present

## 2018-09-23 DIAGNOSIS — E876 Hypokalemia: Secondary | ICD-10-CM | POA: Diagnosis not present

## 2018-09-23 DIAGNOSIS — G9341 Metabolic encephalopathy: Secondary | ICD-10-CM | POA: Diagnosis not present

## 2018-09-26 DIAGNOSIS — J441 Chronic obstructive pulmonary disease with (acute) exacerbation: Secondary | ICD-10-CM | POA: Diagnosis not present

## 2018-09-26 DIAGNOSIS — E785 Hyperlipidemia, unspecified: Secondary | ICD-10-CM | POA: Diagnosis not present

## 2018-09-26 DIAGNOSIS — M2550 Pain in unspecified joint: Secondary | ICD-10-CM | POA: Diagnosis not present

## 2018-09-26 DIAGNOSIS — I10 Essential (primary) hypertension: Secondary | ICD-10-CM | POA: Diagnosis not present

## 2018-09-26 DIAGNOSIS — D72829 Elevated white blood cell count, unspecified: Secondary | ICD-10-CM | POA: Diagnosis not present

## 2018-09-26 DIAGNOSIS — J9611 Chronic respiratory failure with hypoxia: Secondary | ICD-10-CM | POA: Diagnosis not present

## 2018-09-26 DIAGNOSIS — I639 Cerebral infarction, unspecified: Secondary | ICD-10-CM | POA: Diagnosis not present

## 2018-09-26 DIAGNOSIS — M6281 Muscle weakness (generalized): Secondary | ICD-10-CM | POA: Diagnosis not present

## 2018-09-26 DIAGNOSIS — R531 Weakness: Secondary | ICD-10-CM | POA: Diagnosis not present

## 2018-09-26 DIAGNOSIS — G4089 Other seizures: Secondary | ICD-10-CM | POA: Diagnosis not present

## 2018-09-26 DIAGNOSIS — D320 Benign neoplasm of cerebral meninges: Secondary | ICD-10-CM | POA: Diagnosis not present

## 2018-09-26 DIAGNOSIS — R262 Difficulty in walking, not elsewhere classified: Secondary | ICD-10-CM | POA: Diagnosis not present

## 2018-09-26 DIAGNOSIS — R4781 Slurred speech: Secondary | ICD-10-CM | POA: Diagnosis not present

## 2018-09-26 DIAGNOSIS — F0390 Unspecified dementia without behavioral disturbance: Secondary | ICD-10-CM | POA: Diagnosis not present

## 2018-09-26 DIAGNOSIS — I119 Hypertensive heart disease without heart failure: Secondary | ICD-10-CM | POA: Diagnosis not present

## 2018-09-26 DIAGNOSIS — I6381 Other cerebral infarction due to occlusion or stenosis of small artery: Secondary | ICD-10-CM | POA: Diagnosis not present

## 2018-09-26 DIAGNOSIS — G9341 Metabolic encephalopathy: Secondary | ICD-10-CM | POA: Diagnosis not present

## 2018-09-26 DIAGNOSIS — Z7401 Bed confinement status: Secondary | ICD-10-CM | POA: Diagnosis not present

## 2018-09-26 DIAGNOSIS — M810 Age-related osteoporosis without current pathological fracture: Secondary | ICD-10-CM | POA: Diagnosis not present

## 2018-09-26 DIAGNOSIS — R4182 Altered mental status, unspecified: Secondary | ICD-10-CM | POA: Diagnosis not present

## 2018-09-30 DIAGNOSIS — R262 Difficulty in walking, not elsewhere classified: Secondary | ICD-10-CM | POA: Diagnosis not present

## 2018-09-30 DIAGNOSIS — I119 Hypertensive heart disease without heart failure: Secondary | ICD-10-CM | POA: Diagnosis not present

## 2018-09-30 DIAGNOSIS — J9611 Chronic respiratory failure with hypoxia: Secondary | ICD-10-CM | POA: Diagnosis not present

## 2018-09-30 DIAGNOSIS — M810 Age-related osteoporosis without current pathological fracture: Secondary | ICD-10-CM | POA: Diagnosis not present

## 2018-10-09 ENCOUNTER — Other Ambulatory Visit: Payer: Self-pay | Admitting: *Deleted

## 2018-10-09 NOTE — Patient Outreach (Signed)
[facility]) Care Management  10/09/2018  Kristy Cabrera 08-21-1950 [id number]    Received notification from [place] discharge planner that patient could potentially benefit from [place] Management services.  Writer will await further details around disposition plans and needs. Patient remains in SNF currently.   Will continue to collaborate with [facility] UM and facility discharge planner. Will make appropriate [facility] Care Management referral if needed.    Marthenia Rolling, MSN-Ed, RN,BSN [place] Acute Care Coordinator  [phone]

## 2018-10-13 DIAGNOSIS — M810 Age-related osteoporosis without current pathological fracture: Secondary | ICD-10-CM | POA: Diagnosis not present

## 2018-10-13 DIAGNOSIS — E7800 Pure hypercholesterolemia, unspecified: Secondary | ICD-10-CM | POA: Diagnosis not present

## 2018-10-13 DIAGNOSIS — E785 Hyperlipidemia, unspecified: Secondary | ICD-10-CM | POA: Diagnosis not present

## 2018-10-13 DIAGNOSIS — I739 Peripheral vascular disease, unspecified: Secondary | ICD-10-CM | POA: Diagnosis not present

## 2018-10-13 DIAGNOSIS — G4700 Insomnia, unspecified: Secondary | ICD-10-CM | POA: Diagnosis not present

## 2018-10-13 DIAGNOSIS — Z87891 Personal history of nicotine dependence: Secondary | ICD-10-CM | POA: Diagnosis not present

## 2018-10-13 DIAGNOSIS — J9610 Chronic respiratory failure, unspecified whether with hypoxia or hypercapnia: Secondary | ICD-10-CM | POA: Diagnosis not present

## 2018-10-13 DIAGNOSIS — G40909 Epilepsy, unspecified, not intractable, without status epilepticus: Secondary | ICD-10-CM | POA: Diagnosis not present

## 2018-10-13 DIAGNOSIS — F1010 Alcohol abuse, uncomplicated: Secondary | ICD-10-CM | POA: Diagnosis not present

## 2018-10-13 DIAGNOSIS — H409 Unspecified glaucoma: Secondary | ICD-10-CM | POA: Diagnosis not present

## 2018-10-13 DIAGNOSIS — F0390 Unspecified dementia without behavioral disturbance: Secondary | ICD-10-CM | POA: Diagnosis not present

## 2018-10-13 DIAGNOSIS — J449 Chronic obstructive pulmonary disease, unspecified: Secondary | ICD-10-CM | POA: Diagnosis not present

## 2018-10-13 DIAGNOSIS — M8580 Other specified disorders of bone density and structure, unspecified site: Secondary | ICD-10-CM | POA: Diagnosis not present

## 2018-10-13 DIAGNOSIS — Z7982 Long term (current) use of aspirin: Secondary | ICD-10-CM | POA: Diagnosis not present

## 2018-10-13 DIAGNOSIS — Z86011 Personal history of benign neoplasm of the brain: Secondary | ICD-10-CM | POA: Diagnosis not present

## 2018-10-13 DIAGNOSIS — Z9181 History of falling: Secondary | ICD-10-CM | POA: Diagnosis not present

## 2018-10-13 DIAGNOSIS — I10 Essential (primary) hypertension: Secondary | ICD-10-CM | POA: Diagnosis not present

## 2018-10-15 ENCOUNTER — Other Ambulatory Visit: Payer: Self-pay

## 2018-10-15 ENCOUNTER — Other Ambulatory Visit: Payer: Self-pay | Admitting: *Deleted

## 2018-10-15 NOTE — Patient Outreach (Signed)
  [place] [facility]) Care Management  10/15/2018  Kristy Cabrera [birth year] [id number]   Transition of Care Referral   Referral Date: 10/15/18  Referral Source: [facility] Urgent Outreach for [facility]  Date of Admission:  09/26/2018   Date of Discharge: Facility: [facility] on 10/12/18. Please outreach and determine if there are any needs for transition.   Dx Notes Other seizures  Discharged Home   Insurance:  HTA  Last ED visit on 09/22/18  Last hospitalization 09/23/18 to 09/26/18  Patient and her husband, "Joe"returned a call to [place] Patient's husband is able to verify HIPAA Spoke with Mr Dailyn Reith husband Pt with some confusion on Namenda and Aricept  Reviewed and addressed Transitional of care referral with patient  Mr Pucillo reports that they are doing well since the d/c home from [facility] snf  Mrs Current has been seen by home health staff [facility] RN saw her on 10/12/18.Marland Kitchen [facility]  PT called on 10/13/18 but Mr Stanfill "turned him away" He reports [name] is at risk related to the Coronavirus and prefers no visits until there is no further  Risk. Mr Wynn denies mobility concerns. 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 Arlan Organ     Medications: They deny concerns with taking medications as prescribed, affording medications, side effects of medications and questions about medications   Appointments:  her 09/23/2018 3 month checkup had to be cancelled as she was admitted to hospital  Prior to that she has been seen in December 2019 for a 3 month check up  to see Dr  Leroy Sea on Monday 10/19/18    Advance directives: Denies need for assist with advance directives  Denies need for assist with changes to present advance directives    Consent: [facility] RN CM reviewed [facility] services with patient. Patient gave verbal consent for services. Advised patient that other post discharge calls may occur to assess how the patient is doing following the recent hospitalization. Patient voiced understanding and was appreciative of f/u call.  Plan: [facility] RN CM will close case at this time as patient has been assessed and no needs identified/needs resolved.   Pt encouraged to return a call to [facility] RN CM and HTA 24 hour nurse call center prn RN CM provided HTA 24 hour nurse line number as [phone].  [facility] RN CM sent a successful outreach letter as discussed with [facility] brochure enclosed for review  Routed to primary MD   Pablo Pena. Lavina Hamman, RN, BSN, [place] Management Care Coordinator Direct Number [phone] Mobile number [phone]  Main [facility] number [phone] Fax number [phone]

## 2018-10-15 NOTE — Patient Outreach (Signed)
[facility]) Care Management  10/15/2018  Marylou Wages [birth year] [id number]   Transition of Care Referral   Referral Date: 10/15/18  Referral Source: [facility] Urgent Outreach for [facility]  Date of Admission:    Date of Discharge: Facility: [facility] on 10/12/18. Please outreach and determine if there are any needs for transition.   Dx Notes  Other seizures  Discharged Home (no skilled needs)  Insurance:  HTA   Outreach attempt # 1 No answer. [facility] RN CM left HIPAA compliant voicemail message along with CM's contact info.   Plan: [facility] RN CM sent an unsuccessful outreach letter and scheduled this patient for another call attempt within 4 business days   Cyani Kallstrom L. Lavina Hamman, RN, BSN, [place] Management Care Coordinator Direct Number [phone] Mobile number [phone]  Main [facility] number [phone] Fax number [phone]

## 2018-10-19 DIAGNOSIS — Z09 Encounter for follow-up examination after completed treatment for conditions other than malignant neoplasm: Secondary | ICD-10-CM | POA: Diagnosis not present

## 2018-10-19 DIAGNOSIS — R29898 Other symptoms and signs involving the musculoskeletal system: Secondary | ICD-10-CM | POA: Diagnosis not present

## 2018-10-23 DIAGNOSIS — R404 Transient alteration of awareness: Secondary | ICD-10-CM | POA: Diagnosis not present

## 2018-10-23 DIAGNOSIS — I10 Essential (primary) hypertension: Secondary | ICD-10-CM | POA: Diagnosis not present

## 2018-10-23 DIAGNOSIS — R4781 Slurred speech: Secondary | ICD-10-CM | POA: Diagnosis not present

## 2018-10-23 DIAGNOSIS — R569 Unspecified convulsions: Secondary | ICD-10-CM

## 2018-10-23 DIAGNOSIS — Z1339 Encounter for screening examination for other mental health and behavioral disorders: Secondary | ICD-10-CM | POA: Diagnosis not present

## 2018-10-23 DIAGNOSIS — R471 Dysarthria and anarthria: Secondary | ICD-10-CM | POA: Diagnosis not present

## 2018-10-23 DIAGNOSIS — Z1331 Encounter for screening for depression: Secondary | ICD-10-CM | POA: Diagnosis not present

## 2018-10-23 DIAGNOSIS — R001 Bradycardia, unspecified: Secondary | ICD-10-CM | POA: Diagnosis not present

## 2018-10-23 DIAGNOSIS — G459 Transient cerebral ischemic attack, unspecified: Secondary | ICD-10-CM | POA: Diagnosis not present

## 2018-10-23 DIAGNOSIS — Z6832 Body mass index (BMI) 32.0-32.9, adult: Secondary | ICD-10-CM | POA: Diagnosis not present

## 2018-10-23 DIAGNOSIS — R5381 Other malaise: Secondary | ICD-10-CM | POA: Diagnosis not present

## 2018-10-23 DIAGNOSIS — I639 Cerebral infarction, unspecified: Secondary | ICD-10-CM | POA: Diagnosis not present

## 2018-10-23 DIAGNOSIS — Z008 Encounter for other general examination: Secondary | ICD-10-CM | POA: Diagnosis not present

## 2018-10-23 DIAGNOSIS — I959 Hypotension, unspecified: Secondary | ICD-10-CM | POA: Diagnosis not present

## 2018-10-23 DIAGNOSIS — J449 Chronic obstructive pulmonary disease, unspecified: Secondary | ICD-10-CM | POA: Diagnosis not present

## 2018-10-23 DIAGNOSIS — I6389 Other cerebral infarction: Secondary | ICD-10-CM | POA: Diagnosis not present

## 2018-10-23 DIAGNOSIS — E559 Vitamin D deficiency, unspecified: Secondary | ICD-10-CM | POA: Diagnosis not present

## 2018-10-23 DIAGNOSIS — Z125 Encounter for screening for malignant neoplasm of prostate: Secondary | ICD-10-CM | POA: Diagnosis not present

## 2018-10-23 DIAGNOSIS — R410 Disorientation, unspecified: Secondary | ICD-10-CM | POA: Diagnosis not present

## 2018-10-23 DIAGNOSIS — R0602 Shortness of breath: Secondary | ICD-10-CM | POA: Diagnosis not present

## 2018-10-24 ENCOUNTER — Inpatient Hospital Stay (HOSPITAL_COMMUNITY)
Admission: AD | Admit: 2018-10-24 | Discharge: 2018-10-29 | DRG: 023 | Disposition: A | Discharge status: Home Health | Payer: PPO | Source: Other Acute Inpatient Hospital | Attending: Internal Medicine | Admitting: Internal Medicine

## 2018-10-24 ENCOUNTER — Encounter (HOSPITAL_COMMUNITY): Payer: Self-pay | Admitting: Internal Medicine

## 2018-10-24 DIAGNOSIS — I639 Cerebral infarction, unspecified: Secondary | ICD-10-CM | POA: Diagnosis present

## 2018-10-24 DIAGNOSIS — Z8261 Family history of arthritis: Secondary | ICD-10-CM | POA: Diagnosis not present

## 2018-10-24 DIAGNOSIS — F1011 Alcohol abuse, in remission: Secondary | ICD-10-CM | POA: Diagnosis not present

## 2018-10-24 DIAGNOSIS — Z803 Family history of malignant neoplasm of breast: Secondary | ICD-10-CM

## 2018-10-24 DIAGNOSIS — Z85820 Personal history of malignant melanoma of skin: Secondary | ICD-10-CM

## 2018-10-24 DIAGNOSIS — I480 Paroxysmal atrial fibrillation: Secondary | ICD-10-CM | POA: Diagnosis not present

## 2018-10-24 DIAGNOSIS — Z951 Presence of aortocoronary bypass graft: Secondary | ICD-10-CM | POA: Diagnosis not present

## 2018-10-24 DIAGNOSIS — K635 Polyp of colon: Secondary | ICD-10-CM | POA: Diagnosis not present

## 2018-10-24 DIAGNOSIS — Z794 Long term (current) use of insulin: Secondary | ICD-10-CM | POA: Diagnosis not present

## 2018-10-24 DIAGNOSIS — Z833 Family history of diabetes mellitus: Secondary | ICD-10-CM

## 2018-10-24 DIAGNOSIS — F1010 Alcohol abuse, uncomplicated: Secondary | ICD-10-CM | POA: Diagnosis not present

## 2018-10-24 DIAGNOSIS — Z841 Family history of disorders of kidney and ureter: Secondary | ICD-10-CM | POA: Diagnosis not present

## 2018-10-24 DIAGNOSIS — I739 Peripheral vascular disease, unspecified: Secondary | ICD-10-CM | POA: Diagnosis not present

## 2018-10-24 DIAGNOSIS — I63232 Cerebral infarction due to unspecified occlusion or stenosis of left carotid arteries: Secondary | ICD-10-CM | POA: Diagnosis not present

## 2018-10-24 DIAGNOSIS — R29703 NIHSS score 3: Secondary | ICD-10-CM | POA: Diagnosis not present

## 2018-10-24 DIAGNOSIS — Z7982 Long term (current) use of aspirin: Secondary | ICD-10-CM | POA: Diagnosis not present

## 2018-10-24 DIAGNOSIS — G40909 Epilepsy, unspecified, not intractable, without status epilepticus: Secondary | ICD-10-CM | POA: Diagnosis not present

## 2018-10-24 DIAGNOSIS — Z45010 Encounter for checking and testing of cardiac pacemaker pulse generator [battery]: Secondary | ICD-10-CM | POA: Diagnosis not present

## 2018-10-24 DIAGNOSIS — J449 Chronic obstructive pulmonary disease, unspecified: Secondary | ICD-10-CM | POA: Diagnosis not present

## 2018-10-24 DIAGNOSIS — K317 Polyp of stomach and duodenum: Secondary | ICD-10-CM | POA: Diagnosis not present

## 2018-10-24 DIAGNOSIS — R4701 Aphasia: Secondary | ICD-10-CM | POA: Diagnosis not present

## 2018-10-24 DIAGNOSIS — Z825 Family history of asthma and other chronic lower respiratory diseases: Secondary | ICD-10-CM | POA: Diagnosis not present

## 2018-10-24 DIAGNOSIS — Z148 Genetic carrier of other disease: Secondary | ICD-10-CM | POA: Diagnosis not present

## 2018-10-24 DIAGNOSIS — L720 Epidermal cyst: Secondary | ICD-10-CM | POA: Diagnosis not present

## 2018-10-24 DIAGNOSIS — E113391 Type 2 diabetes mellitus with moderate nonproliferative diabetic retinopathy without macular edema, right eye: Secondary | ICD-10-CM | POA: Diagnosis not present

## 2018-10-24 DIAGNOSIS — R7881 Bacteremia: Secondary | ICD-10-CM | POA: Diagnosis not present

## 2018-10-24 DIAGNOSIS — E1165 Type 2 diabetes mellitus with hyperglycemia: Secondary | ICD-10-CM | POA: Diagnosis not present

## 2018-10-24 DIAGNOSIS — M5136 Other intervertebral disc degeneration, lumbar region: Secondary | ICD-10-CM | POA: Diagnosis not present

## 2018-10-24 DIAGNOSIS — Z5181 Encounter for therapeutic drug level monitoring: Secondary | ICD-10-CM | POA: Diagnosis not present

## 2018-10-24 DIAGNOSIS — I63512 Cerebral infarction due to unspecified occlusion or stenosis of left middle cerebral artery: Secondary | ICD-10-CM | POA: Diagnosis not present

## 2018-10-24 DIAGNOSIS — G459 Transient cerebral ischemic attack, unspecified: Secondary | ICD-10-CM | POA: Diagnosis not present

## 2018-10-24 DIAGNOSIS — M810 Age-related osteoporosis without current pathological fracture: Secondary | ICD-10-CM | POA: Diagnosis not present

## 2018-10-24 DIAGNOSIS — R531 Weakness: Secondary | ICD-10-CM | POA: Diagnosis present

## 2018-10-24 DIAGNOSIS — L270 Generalized skin eruption due to drugs and medicaments taken internally: Secondary | ICD-10-CM | POA: Diagnosis not present

## 2018-10-24 DIAGNOSIS — Z950 Presence of cardiac pacemaker: Secondary | ICD-10-CM | POA: Diagnosis not present

## 2018-10-24 DIAGNOSIS — Z8249 Family history of ischemic heart disease and other diseases of the circulatory system: Secondary | ICD-10-CM | POA: Diagnosis not present

## 2018-10-24 DIAGNOSIS — G8321 Monoplegia of upper limb affecting right dominant side: Secondary | ICD-10-CM | POA: Diagnosis not present

## 2018-10-24 DIAGNOSIS — R634 Abnormal weight loss: Secondary | ICD-10-CM | POA: Diagnosis not present

## 2018-10-24 DIAGNOSIS — Z0000 Encounter for general adult medical examination without abnormal findings: Secondary | ICD-10-CM | POA: Diagnosis not present

## 2018-10-24 DIAGNOSIS — I63312 Cerebral infarction due to thrombosis of left middle cerebral artery: Secondary | ICD-10-CM | POA: Diagnosis not present

## 2018-10-24 DIAGNOSIS — G3184 Mild cognitive impairment, so stated: Secondary | ICD-10-CM | POA: Diagnosis present

## 2018-10-24 DIAGNOSIS — F39 Unspecified mood [affective] disorder: Secondary | ICD-10-CM | POA: Diagnosis present

## 2018-10-24 DIAGNOSIS — H8109 Meniere's disease, unspecified ear: Secondary | ICD-10-CM | POA: Diagnosis not present

## 2018-10-24 DIAGNOSIS — Z9189 Other specified personal risk factors, not elsewhere classified: Secondary | ICD-10-CM | POA: Diagnosis not present

## 2018-10-24 DIAGNOSIS — A4150 Gram-negative sepsis, unspecified: Secondary | ICD-10-CM | POA: Diagnosis not present

## 2018-10-24 DIAGNOSIS — G894 Chronic pain syndrome: Secondary | ICD-10-CM | POA: Diagnosis not present

## 2018-10-24 DIAGNOSIS — C9000 Multiple myeloma not having achieved remission: Secondary | ICD-10-CM | POA: Diagnosis not present

## 2018-10-24 DIAGNOSIS — R931 Abnormal findings on diagnostic imaging of heart and coronary circulation: Secondary | ICD-10-CM | POA: Diagnosis not present

## 2018-10-24 DIAGNOSIS — R29704 NIHSS score 4: Secondary | ICD-10-CM | POA: Diagnosis present

## 2018-10-24 DIAGNOSIS — I10 Essential (primary) hypertension: Secondary | ICD-10-CM | POA: Diagnosis not present

## 2018-10-24 DIAGNOSIS — E7800 Pure hypercholesterolemia, unspecified: Secondary | ICD-10-CM | POA: Diagnosis not present

## 2018-10-24 DIAGNOSIS — B029 Zoster without complications: Secondary | ICD-10-CM | POA: Diagnosis not present

## 2018-10-24 DIAGNOSIS — G510 Bell's palsy: Secondary | ICD-10-CM | POA: Diagnosis present

## 2018-10-24 DIAGNOSIS — T300 Burn of unspecified body region, unspecified degree: Secondary | ICD-10-CM | POA: Diagnosis not present

## 2018-10-24 DIAGNOSIS — E11311 Type 2 diabetes mellitus with unspecified diabetic retinopathy with macular edema: Secondary | ICD-10-CM | POA: Diagnosis not present

## 2018-10-24 DIAGNOSIS — Z87891 Personal history of nicotine dependence: Secondary | ICD-10-CM

## 2018-10-24 DIAGNOSIS — E785 Hyperlipidemia, unspecified: Secondary | ICD-10-CM | POA: Diagnosis present

## 2018-10-24 DIAGNOSIS — R55 Syncope and collapse: Secondary | ICD-10-CM | POA: Diagnosis not present

## 2018-10-24 DIAGNOSIS — F0390 Unspecified dementia without behavioral disturbance: Secondary | ICD-10-CM | POA: Diagnosis not present

## 2018-10-24 DIAGNOSIS — Z79899 Other long term (current) drug therapy: Secondary | ICD-10-CM

## 2018-10-24 DIAGNOSIS — R7989 Other specified abnormal findings of blood chemistry: Secondary | ICD-10-CM | POA: Diagnosis not present

## 2018-10-24 DIAGNOSIS — M8580 Other specified disorders of bone density and structure, unspecified site: Secondary | ICD-10-CM | POA: Diagnosis present

## 2018-10-24 DIAGNOSIS — Z86011 Personal history of benign neoplasm of the brain: Secondary | ICD-10-CM | POA: Diagnosis not present

## 2018-10-24 DIAGNOSIS — R4189 Other symptoms and signs involving cognitive functions and awareness: Secondary | ICD-10-CM | POA: Diagnosis present

## 2018-10-24 DIAGNOSIS — I6522 Occlusion and stenosis of left carotid artery: Secondary | ICD-10-CM | POA: Diagnosis not present

## 2018-10-24 DIAGNOSIS — J8410 Pulmonary fibrosis, unspecified: Secondary | ICD-10-CM | POA: Diagnosis not present

## 2018-10-24 DIAGNOSIS — Z124 Encounter for screening for malignant neoplasm of cervix: Secondary | ICD-10-CM | POA: Diagnosis not present

## 2018-10-24 DIAGNOSIS — H35033 Hypertensive retinopathy, bilateral: Secondary | ICD-10-CM | POA: Diagnosis not present

## 2018-10-24 DIAGNOSIS — G4700 Insomnia, unspecified: Secondary | ICD-10-CM | POA: Diagnosis present

## 2018-10-24 DIAGNOSIS — Z1159 Encounter for screening for other viral diseases: Secondary | ICD-10-CM | POA: Diagnosis not present

## 2018-10-24 DIAGNOSIS — M961 Postlaminectomy syndrome, not elsewhere classified: Secondary | ICD-10-CM | POA: Diagnosis not present

## 2018-10-24 DIAGNOSIS — R471 Dysarthria and anarthria: Secondary | ICD-10-CM | POA: Diagnosis not present

## 2018-10-24 DIAGNOSIS — I4891 Unspecified atrial fibrillation: Secondary | ICD-10-CM | POA: Diagnosis not present

## 2018-10-24 DIAGNOSIS — J479 Bronchiectasis, uncomplicated: Secondary | ICD-10-CM | POA: Diagnosis not present

## 2018-10-24 DIAGNOSIS — L03116 Cellulitis of left lower limb: Secondary | ICD-10-CM | POA: Diagnosis not present

## 2018-10-24 DIAGNOSIS — M546 Pain in thoracic spine: Secondary | ICD-10-CM | POA: Diagnosis not present

## 2018-10-24 DIAGNOSIS — H43813 Vitreous degeneration, bilateral: Secondary | ICD-10-CM | POA: Diagnosis not present

## 2018-10-24 DIAGNOSIS — R002 Palpitations: Secondary | ICD-10-CM | POA: Diagnosis not present

## 2018-10-24 DIAGNOSIS — Z4682 Encounter for fitting and adjustment of non-vascular catheter: Secondary | ICD-10-CM | POA: Diagnosis not present

## 2018-10-24 DIAGNOSIS — G4733 Obstructive sleep apnea (adult) (pediatric): Secondary | ICD-10-CM | POA: Diagnosis not present

## 2018-10-24 DIAGNOSIS — I442 Atrioventricular block, complete: Secondary | ICD-10-CM | POA: Diagnosis not present

## 2018-10-24 DIAGNOSIS — R000 Tachycardia, unspecified: Secondary | ICD-10-CM | POA: Diagnosis not present

## 2018-10-24 DIAGNOSIS — R569 Unspecified convulsions: Secondary | ICD-10-CM | POA: Diagnosis not present

## 2018-10-24 DIAGNOSIS — Z808 Family history of malignant neoplasm of other organs or systems: Secondary | ICD-10-CM

## 2018-10-24 DIAGNOSIS — R300 Dysuria: Secondary | ICD-10-CM | POA: Diagnosis not present

## 2018-10-24 DIAGNOSIS — H353132 Nonexudative age-related macular degeneration, bilateral, intermediate dry stage: Secondary | ICD-10-CM | POA: Diagnosis not present

## 2018-10-24 DIAGNOSIS — Z23 Encounter for immunization: Secondary | ICD-10-CM | POA: Diagnosis not present

## 2018-10-24 DIAGNOSIS — R0600 Dyspnea, unspecified: Secondary | ICD-10-CM | POA: Diagnosis not present

## 2018-10-24 DIAGNOSIS — A419 Sepsis, unspecified organism: Secondary | ICD-10-CM | POA: Diagnosis not present

## 2018-10-24 DIAGNOSIS — Z9481 Bone marrow transplant status: Secondary | ICD-10-CM | POA: Diagnosis not present

## 2018-10-24 DIAGNOSIS — E113312 Type 2 diabetes mellitus with moderate nonproliferative diabetic retinopathy with macular edema, left eye: Secondary | ICD-10-CM | POA: Diagnosis not present

## 2018-10-24 LAB — CBC
HCT: 38.4 % (ref 36.0–46.0)
Hemoglobin: 12.9 g/dL (ref 12.0–15.0)
MCH: 29.5 pg (ref 26.0–34.0)
MCHC: 33.6 g/dL (ref 30.0–36.0)
MCV: 87.9 fL (ref 80.0–100.0)
Platelets: 284 10*3/uL (ref 150–400)
RBC: 4.37 MIL/uL (ref 3.87–5.11)
RDW: 11.9 % (ref 11.5–15.5)
WBC: 10 10*3/uL (ref 4.0–10.5)
nRBC: 0 % (ref 0.0–0.2)

## 2018-10-24 LAB — CREATININE, SERUM
Creatinine, Ser: 0.65 mg/dL (ref 0.44–1.00)
GFR calc Af Amer: >60 mL/min (ref >60)
GFR calc non Af Amer: >60 mL/min (ref >60)

## 2018-10-24 MED ORDER — POLYETHYLENE GLYCOL 3350 17 G PO PACK: 17.0000 g | PACK | Freq: Every day | ORAL | Status: DC | PRN

## 2018-10-24 MED ORDER — ACETAMINOPHEN 325 MG PO TABS: 650.0000 mg | ORAL_TABLET | Freq: Four times a day (QID) | ORAL | Status: DC | PRN

## 2018-10-24 MED ORDER — ONDANSETRON HCL 4 MG/2ML IJ SOLN: 4.0000 mg | Freq: Four times a day (QID) | INTRAMUSCULAR | Status: DC | PRN

## 2018-10-24 MED ORDER — ASPIRIN EC 81 MG PO TBEC
81.0000 mg | DELAYED_RELEASE_TABLET | Freq: Every day | ORAL | Status: DC
  Administered 2018-10-24 – 2018-10-29 (×5): 81 mg via ORAL
  Filled 2018-10-24 (×5): qty 1

## 2018-10-24 MED ORDER — TRAZODONE HCL 50 MG PO TABS: 50.0000 mg | ORAL_TABLET | Freq: Every evening | ORAL | Status: DC | PRN

## 2018-10-24 MED ORDER — ENOXAPARIN SODIUM 40 MG/0.4ML ~~LOC~~ SOLN
40.0000 mg | SUBCUTANEOUS | Status: DC
  Administered 2018-10-24 – 2018-10-28 (×5): 40 mg via SUBCUTANEOUS
  Filled 2018-10-24 (×5): qty 0.4

## 2018-10-24 MED ORDER — SODIUM CHLORIDE 0.9% FLUSH
3.0000 mL | Freq: Two times a day (BID) | INTRAVENOUS | Status: DC
  Administered 2018-10-24 – 2018-10-29 (×8): 3 mL via INTRAVENOUS

## 2018-10-24 MED ORDER — MEMANTINE HCL 10 MG PO TABS
10.0000 mg | ORAL_TABLET | Freq: Every day | ORAL | Status: DC
  Administered 2018-10-24 – 2018-10-29 (×5): 10 mg via ORAL
  Filled 2018-10-24 (×6): qty 1

## 2018-10-24 MED ORDER — ADULT MULTIVITAMIN W/MINERALS CH
1.0000 | ORAL_TABLET | Freq: Every day | ORAL | Status: DC
  Administered 2018-10-24 – 2018-10-29 (×5): 1 via ORAL
  Filled 2018-10-24 (×5): qty 1

## 2018-10-24 MED ORDER — ONDANSETRON HCL 4 MG PO TABS: 4.0000 mg | ORAL_TABLET | Freq: Four times a day (QID) | ORAL | Status: DC | PRN

## 2018-10-24 MED ORDER — ATORVASTATIN CALCIUM 80 MG PO TABS
80.0000 mg | ORAL_TABLET | Freq: Every day | ORAL | Status: DC
  Administered 2018-10-24 – 2018-10-28 (×5): 80 mg via ORAL
  Filled 2018-10-24 (×5): qty 1

## 2018-10-24 MED ORDER — ACETAMINOPHEN 650 MG RE SUPP: 650.0000 mg | Freq: Four times a day (QID) | RECTAL | Status: DC | PRN

## 2018-10-24 MED ORDER — LEVETIRACETAM 750 MG PO TABS
750.0000 mg | ORAL_TABLET | Freq: Two times a day (BID) | ORAL | Status: DC
  Administered 2018-10-24 – 2018-10-29 (×9): 750 mg via ORAL
  Filled 2018-10-24 (×11): qty 1

## 2018-10-24 MED ORDER — DONEPEZIL HCL 5 MG PO TABS
5.0000 mg | ORAL_TABLET | Freq: Every day | ORAL | Status: DC
  Administered 2018-10-24 – 2018-10-28 (×5): 5 mg via ORAL
  Filled 2018-10-24 (×5): qty 1

## 2018-10-24 MED ORDER — OXYCODONE HCL 5 MG PO TABS: 5.0000 mg | ORAL_TABLET | ORAL | Status: DC | PRN

## 2018-10-24 NOTE — Consult Note (Signed)
Hospital Consult    Reason for Consult: Symptomatic left ICA stenosis Referring Physician: Oval Linsey MRN #:  [id number]  History of Present Illness: This is a 68 y.o. female with history of brain tumor, hypertension, hyperlipidemia, melanoma that presents as a transfer from [place] for evaluation of symptomatic high-grade left ICA stenosis.  Patient states she was in her normal state of health until last night when she developed right upper arm weakness with some dysarthria.  States she was drinking a cup of coffee and talking to a friend on the phone when the event occurred.  Ultimately she presented to [facility] where an MRI was obtained concerning for a ischemic nonhemorrhagic infarct on the left as well as left high-grade ICA stenosis noted on CTA.  She was evaluated by a tele neurologist who recommended carotid intervention.  Patient denies any previous history of TIA or strokes.  She states she had a brain tumor years ago that was excised at [place] she is ambulatory and walks with a walker.  She lives at home with her husband.  On evaluation her right arm weakness has resolved but she does have pretty profound dysarthria.  He denies any previous neck surgery or radiation.  Past Medical History:  Diagnosis Date  . Brain tumor (benign) ([place])   . Episodic mood disorder ([place])   . Hyperlipidemia   . Hypertension   . Insomnia   . Osteopenia   . Weakness of both legs     Past Surgical History:  Procedure Laterality Date  . Brain tumor removal    . MELANOMA EXCISION  1987   BACK    No Known Allergies  Prior to Admission medications   Medication Sig Start Date End Date Taking? Authorizing Provider  amLODipine (NORVASC) 10 MG tablet Take 10 mg by mouth daily.   Yes [provider]  aspirin EC 81 MG tablet Take 81 mg by mouth daily.   Yes [provider]  atenolol (TENORMIN) 100 MG tablet Take 100 mg by mouth.   Yes [provider]  B Complex-C  (SUPER B COMPLEX PO) Take 1 tablet by mouth daily.   Yes [provider]  calcium-vitamin D (OSCAL WITH D) 500-200 MG-UNIT tablet Take 1 tablet by mouth 2 (two) times daily.   Yes [provider]  donepezil (ARICEPT) 5 MG tablet Take 5 mg by mouth at bedtime.   Yes [provider]  levETIRAcetam (KEPPRA) 750 MG tablet Take 750 mg by mouth 2 (two) times daily.   Yes [provider]  Melatonin 3 MG CAPS Take 3 mg by mouth at bedtime.   Yes [provider]  memantine (NAMENDA) 10 MG tablet Take 10 mg by mouth daily.    Yes [provider]  pravastatin (PRAVACHOL) 20 MG tablet Take 20 mg by mouth daily.   Yes [provider]    Social History   Socioeconomic History  . Marital status: Married    Spouse name: Not on file  . Number of children: Not on file  . Years of education: Not on file  . Highest education level: Not on file  Occupational History  . Not on file  Social Needs  . Financial resource strain: Not on file  . Food insecurity:    Worry: Not on file    Inability: Not on file  . Transportation needs:    Medical: Not on file    Non-medical: Not on file  Tobacco Use  . Smoking  status: Former Smoker    Packs/day: 3.00    Types: Cigarettes    Last attempt to quit: 08/22/2014    Years since quitting: 4.1  . Smokeless tobacco: Never Used  Substance and Sexual Activity  . Alcohol use: Not Currently    Comment: , quit 07/2014  . Drug use: No  . Sexual activity: Not on file  Lifestyle  . Physical activity:    Days per week: Not on file    Minutes per session: Not on file  . Stress: Not on file  Relationships  . Social connections:    Talks on phone: Not on file    Gets together: Not on file    Attends religious service: Not on file    Active member of club or organization: Not on file    Attends meetings of clubs or organizations: Not on file    Relationship status: Not on file  . Intimate partner violence:     Fear of current or ex partner: Not on file    Emotionally abused: Not on file    Physically abused: Not on file    Forced sexual activity: Not on file  Other Topics Concern  . Not on file  Social History Narrative   Caffeine 4 servings daily.  Lives home with husband, [name].  Retired.       Family History  Problem Relation Age of Onset  . Diabetes Mother   . Breast cancer Mother   . Hypertension Mother   . Kidney disease Mother   . Arthritis Mother   . Brain cancer Mother   . Diabetes Sister   . Hypertension Sister   . Diabetes Brother   . Hypertension Brother   . Diabetes Maternal Grandmother   . Hypertension Maternal Grandmother   . Heart disease Maternal Grandmother   . Asthma Maternal Grandfather     ROS: [x]  Positive   [ ]  Negative   [ ]  All sytems reviewed and are negative  Cardiovascular: []  chest pain/pressure []  palpitations []  SOB lying flat []  DOE []  pain in legs while walking []  pain in legs at rest []  pain in legs at night []  non-healing ulcers []  hx of DVT []  swelling in legs  Pulmonary: []  productive cough []  asthma/wheezing []  home O2  Neurologic: [x]  weakness in [x]  arms []  legs (right) []  numbness in []  arms []  legs []  hx of CVA []  mini stroke [x] difficulty speaking or slurred speech []  temporary loss of vision in one eye []  dizziness  Hematologic: [x]  hx of cancer []  bleeding problems []  problems with blood clotting easily  Endocrine:   []  diabetes []  thyroid disease  GI []  vomiting blood []  blood in stool  GU: []  CKD/renal failure []  HD--[]  M/W/F or []  T/T/S []  burning with urination []  blood in urine  Psychiatric: []  anxiety []  depression  Musculoskeletal: []  arthritis []  joint pain  Integumentary: []  rashes []  ulcers  Constitutional: []  fever []  chills   Physical Examination  Vitals:   10/24/18 1340  BP: (!) 118/55  Pulse: (!) 55  Resp: 16  Temp: 97.7 F (36.5 C)  SpO2: 97%   There is no  height or weight on file to calculate BMI.  General:  WDWN in NAD Gait: Not observed HENT: WNL, normocephalic Pulmonary: normal non-labored breathing, without Rales, rhonchi,  wheezing Cardiac: regular, without  Murmurs, rubs or gallops Abdomen: soft, NT/ND, no masses Skin: without rashes Extremities: without ischemic changes, without Gangrene , without cellulitis; without  open wounds;  Musculoskeletal: no muscle wasting or atrophy  Neurologic: A&O X 3; Appropriate Affect ; SENSATION: normal; MOTOR FUNCTION:  moving all extremities equally. Dysarthria.  CN II-XII grossly intact.   CBC    Component Value Date/Time   WBC 10.0 10/24/2018 1553   RBC 4.37 10/24/2018 1553   HGB 12.9 10/24/2018 1553   HCT 38.4 10/24/2018 1553   PLT 284 10/24/2018 1553   MCV 87.9 10/24/2018 1553   MCH 29.5 10/24/2018 1553   MCHC 33.6 10/24/2018 1553   RDW 11.9 10/24/2018 1553   LYMPHSABS 2.0 07/04/2016 1147   MONOABS 1.1 (H) 07/04/2016 1147   EOSABS 0.1 07/04/2016 1147   BASOSABS 0.1 07/04/2016 1147    BMET    Component Value Date/Time   NA 125 (L) 07/06/2016 0908   K 4.1 07/06/2016 0908   CL 94 (L) 07/06/2016 0908   CO2 23 07/06/2016 0908   GLUCOSE 97 07/06/2016 0908   BUN <5 (L) 07/06/2016 0908   CREATININE 0.65 10/24/2018 1553   CALCIUM 8.8 (L) 07/06/2016 0908   GFRNONAA >60 10/24/2018 1553   GFRAA >60 10/24/2018 1553    COAGS: Lab Results  Component Value Date   INR 0.94 07/04/2016     Non-Invasive Vascular Imaging:    MRI brain Oval Linsey 10/23/18: 2 cm acute ischemic nonhemorrhagic infarct involving the subcortical and white matter of the posterior left centrum semi-ovale  CTA neck Oval Linsey 10/23/18: 85% stenosis of the left ICA and bifurcation.  80% stenosis at the origin of the right vertebral artery.  Severely diseased aortic arch and great vessels with significant stenosis of bilateral proximal subclavian arteries.    ASSESSMENT/PLAN: This is a 68 y.o. female that presents as  a transfer from [place] with symptomatic high-grade left ICA stenosis as correlated on MRI.  I have reviewed her CTA and it indeed shows greater than 80% stenosis of her left ICA.  I think she would probably benefit from a left carotid endarterectomy.  I will follow-up with her tomorrow to discuss timing of surgery this week.  Current guidelines are that we wait at least 48 hours from her last neurologic event which was last night prior to any surgical intervention.    Marty Heck, MD [facility] Office: [phone] Pager: [place]

## 2018-10-24 NOTE — Plan of Care (Signed)
  Problem: Health Behavior/Discharge Planning: Goal: Ability to manage health-related needs will improve Outcome: Progressing   Problem: Clinical Measurements: Goal: Ability to maintain clinical measurements within normal limits will improve Outcome: Progressing Goal: Will remain free from infection Outcome: Progressing Goal: Diagnostic test results will improve Outcome: Progressing Goal: Respiratory complications will improve Outcome: Progressing Goal: Cardiovascular complication will be avoided Outcome: Progressing   Problem: Activity: Goal: Risk for activity intolerance will decrease Outcome: Progressing   Problem: Nutrition: Goal: Adequate nutrition will be maintained Outcome: Progressing   Problem: Coping: Goal: Level of anxiety will decrease Outcome: Progressing   Problem: Elimination: Goal: Will not experience complications related to bowel motility Outcome: Progressing Goal: Will not experience complications related to urinary retention Outcome: Progressing   Problem: Pain Managment: Goal: General experience of comfort will improve Outcome: Progressing   Problem: Safety: Goal: Ability to remain free from injury will improve Outcome: Progressing   Problem: Skin Integrity: Goal: Risk for impaired skin integrity will decrease Outcome: Progressing   Problem: Education: Goal: Knowledge of disease or condition will improve Outcome: Progressing Goal: Knowledge of secondary prevention will improve Outcome: Progressing Goal: Knowledge of patient specific risk factors addressed and post discharge goals established will improve Outcome: Progressing   Problem: Coping: Goal: Will identify appropriate support needs Outcome: Progressing   Problem: Health Behavior/Discharge Planning: Goal: Ability to manage health-related needs will improve Outcome: Progressing   Problem: Self-Care: Goal: Ability to communicate needs accurately will improve Outcome:  Progressing   Problem: Ischemic Stroke/TIA Tissue Perfusion: Goal: Complications of ischemic stroke/TIA will be minimized Outcome: Progressing  Will conti to monitor.   Ival Bible, BSN, RN

## 2018-10-24 NOTE — H&P (Signed)
History and Physical    Kristy Cabrera CVE:[id number] DOB: Jun 03, 1951 DOA: 10/24/2018  PCP: Nelda Bucks, MD [place] Patient coming from: [facility]  I have personally briefly reviewed patient's old medical records in [place]  Chief Complaint: Left internal carotid artery stenosis  HPI: Kristy Cabrera is a 68 y.o. female with medical history significant of hypertension, seizure disorder, COPD, syncope, brain meningioma status post resection, alcohol abuse in remission, and syncope who presented to the emergency [facility] on April 3 with complaints of right upper extremity numbness/tingling with slurred speech.  She was on the phone with a friend and noted symptoms at 9 AM in the morning.  She was recently admitted in March to [facility] with altered mental status and generalized weakness she was diagnosed with metabolic encephalopathy.  At that time CT scan of the head was negative she also had an EEG that was unremarkable.  She has no fevers, cough, shortness of breath, chills, chest pain, abdominal pain, or lower extremity edema.  She was admitted to [facility] and underwent MRI which showed a 2 cm acute ischemic nonhemorrhagic infarct of the cortical and deep white matter of the posterior left tentorium semi-ovale with no associated hemorrhage.  She was then diagnosed with a near left MCA thrombotic stroke due to left internal carotid artery stenosis.  Dr. Carlis Abbott from vascular surgery was contacted and accepted the patient for evaluation.  He requested we admit the patient to our service.  Patient transferred here for vascular surgery to left carotid stenosis   Review of Systems: As per HPI otherwise all other systems reviewed and  negative.  Past Medical History:  Diagnosis Date  . Brain tumor (benign) ([place])   . Episodic mood disorder ([place])   . Hyperlipidemia   . Hypertension   . Insomnia   . Osteopenia   . Weakness of both legs      Past Surgical History:  Procedure Laterality Date  . Brain tumor removal    . [place]    Social History   Social History Narrative   Caffeine 4 servings daily.  Lives home with husband, [name].  Retired.       reports that she quit smoking about 4 years ago. Her smoking use included cigarettes. She smoked 3.00 packs per day. She has never used smokeless tobacco. She reports previous alcohol use. She reports that she does not use drugs. Is now in remission from alcohol abuse No Known Allergies  Family History  Problem Relation Age of Onset  . Diabetes Mother   . Breast cancer Mother   . Hypertension Mother   . Kidney disease Mother   . Arthritis Mother   . Brain cancer Mother   . Diabetes Sister   . Hypertension Sister   . Diabetes Brother   . Hypertension Brother   . Diabetes Maternal Grandmother   . Hypertension Maternal Grandmother   . Heart disease Maternal Grandmother   . Asthma Maternal Grandfather      Prior to Admission medications   Medication Sig Start Date End Date Taking? Authorizing Provider  atenolol (TENORMIN) 100 MG tablet Take 100 mg by mouth.    [provider]  Cyanocobalamin (VITAMIN B 12 PO) Take 500 mg by mouth 2 (two) times daily.    [provider]  donepezil (ARICEPT) 5 MG tablet Take 5 mg by mouth at bedtime.    [provider]  latanoprost (XALATAN) 0.005 % ophthalmic  solution Place 1 drop into both eyes at bedtime.    [provider]  levETIRAcetam (KEPPRA) 500 MG tablet Take 500 mg by mouth 2 (two) times daily.  07/24/14   [provider]  simvastatin (ZOCOR) 20 MG tablet Take 20 mg by mouth daily.    [provider]    Physical Exam:  Constitutional: NAD, calm, comfortable, very confused and slight slurred speech Vitals:   10/24/18 1340  BP: (!) 118/55  Pulse: (!) 55  Resp: 16  Temp: 97.7 F (36.5 C)  TempSrc: Oral  SpO2: 97%   Eyes: PERRL, lids and  conjunctivae normal ENMT: Mucous membranes are moist. Posterior pharynx clear of any exudate or lesions.Normal dentition.  Neck: normal, supple, no masses, no thyromegaly Respiratory: clear to auscultation bilaterally, no wheezing, no crackles. Normal respiratory effort. No accessory muscle use.  Cardiovascular: Regular rate and rhythm, no murmurs / rubs / gallops. No extremity edema. 2+ pedal pulses. No carotid bruits.  Abdomen: no tenderness, no masses palpated. No hepatosplenomegaly. Bowel sounds positive.  Musculoskeletal: no clubbing / cyanosis. No joint deformity upper and lower extremities. Good ROM, no contractures. Normal muscle tone.  Skin: no rashes, lesions, ulcers. No induration Neurologic: CN 2-12 grossly intact. Sensation intact, DTR normal. Strength 5/5 in all 4.  Speech slightly slurred Psychiatric: Normal judgment and insight. Alert and disoriented. Normal mood.    Labs on Admission: I have personally reviewed following labs and imaging studies  Urine analysis:    Component Value Date/Time   COLORURINE YELLOW 07/04/2016 [place] 07/04/2016 1230   LABSPEC 1.005 07/04/2016 1230   PHURINE 8.0 07/04/2016 1230   GLUCOSEU NEGATIVE 07/04/2016 1230   HGBUR NEGATIVE 07/04/2016 1230   BILIRUBINUR NEGATIVE 07/04/2016 1230   KETONESUR 5 (A) 07/04/2016 1230   PROTEINUR NEGATIVE 07/04/2016 1230   NITRITE NEGATIVE 07/04/2016 1230   LEUKOCYTESUR NEGATIVE 07/04/2016 1230    Radiological Exams on Admission: No results found.  EKG: Independently reviewed.  Sinus bradycardia at 53 bpm  Assessment/Plan Principal Problem:   Stenosis of internal carotid artery with cerebral infarction, left [place]) Active Problems:   Acute ischemic left MCA stroke (HCC)   Seizure disorder (HCC)   COPD (chronic obstructive pulmonary disease) (HCC)   Cognitive impairment   Alcohol abuse   Syncope   1.  Stenosis of internal carotid artery with cerebral infarction of the left:  Transferred from [facility] for evaluation for possible surgery with Dr. Carlis Abbott.  Carlis Abbott has been notified of admission.  Patient placed on neuro telemetry.  2.  Acute ischemic left MCA thrombotic stroke: Patient started on Plavix at [facility] will hold given possible surgery.  We will continue aspirin.  Wait further recommendations from Dr. Carlis Abbott.  3.  Seizure disorder: Currently on Keppra 750 p.o. twice daily will continue.  4.  COPD: Currently stable without exacerbation we will continue home medications.  Currently not requiring MDIs.  5.  Cognitive impairment: Likely combination of alcohol abuse, seizure disorder and from stroke.  Currently on Namenda and Aricept.  Will continue same.  6.  Alcohol abuse now in remission: Noted.  7.  History of of syncope: Currently stable no further episodes.    DVT prophylaxis: Lovenox Code Status: Full code Family Communication: Patient's husband contacted by phone Disposition Plan: Home versus skilled nursing facility in 2 to 3 days (after surgery) Consults called: Vascular surgery Dr. Carlis Abbott Admission status: Inpatient   [name] MD [facility] Pager [phone]  How to contact the [facility] Attending or Consulting provider [place] or covering provider during after hours [place], for this patient?  1. Check the care team in [facility] and look for a) attending/consulting [facility] provider listed and b) the [facility] team listed 2. Log into [email or web address] and use [place]'s universal password to access. If you do not have the password, please contact the hospital operator. 3. Locate the [facility] provider you are looking for under [facility] and page to a number that you can be directly reached. 4. If you still have difficulty reaching the provider, please page the [facility] (Director on Call) for the Hospitalists listed on amion for assistance.  If 7PM-7AM, please contact night-coverage [email or web address] Password [email or web address]  10/24/2018, 2:52  PM

## 2018-10-24 NOTE — Progress Notes (Signed)
Patient arrived from [place] via [place] transport to room [phone]. Patient is alert, constantly talking; oriented to room and unit routine; speech is slurred but understandable. Fall safety reviewed; bed low and locked.Patient reported dizziness with transfer from the stretcher to the bed; resolved when she laid down.

## 2018-10-25 LAB — BASIC METABOLIC PANEL
Anion gap: 9 (ref 5–15)
BUN: 23 mg/dL (ref 8–23)
CO2: 22 mmol/L (ref 22–32)
Calcium: 9.5 mg/dL (ref 8.9–10.3)
Chloride: 107 mmol/L (ref 98–111)
Creatinine, Ser: 0.75 mg/dL (ref 0.44–1.00)
GFR calc Af Amer: >60 mL/min (ref >60)
GFR calc non Af Amer: >60 mL/min (ref >60)
Glucose, Bld: 86 mg/dL (ref 70–99)
Potassium: 3.8 mmol/L (ref 3.5–5.1)
Sodium: 138 mmol/L (ref 135–145)

## 2018-10-25 LAB — PROTIME-INR
INR: 1 (ref 0.8–1.2)
Prothrombin Time: 12.9 seconds (ref 11.4–15.2)

## 2018-10-25 LAB — HIV ANTIBODY (ROUTINE TESTING W REFLEX): HIV Screen 4th Generation wRfx: NONREACTIVE

## 2018-10-25 LAB — CBC
HCT: 41.4 % (ref 36.0–46.0)
Hemoglobin: 13.9 g/dL (ref 12.0–15.0)
MCH: 29.8 pg (ref 26.0–34.0)
MCHC: 33.6 g/dL (ref 30.0–36.0)
MCV: 88.8 fL (ref 80.0–100.0)
Platelets: 296 10*3/uL (ref 150–400)
RBC: 4.66 MIL/uL (ref 3.87–5.11)
RDW: 12.1 % (ref 11.5–15.5)
WBC: 10.2 10*3/uL (ref 4.0–10.5)
nRBC: 0 % (ref 0.0–0.2)

## 2018-10-25 LAB — APTT: aPTT: 36 seconds (ref 24–36)

## 2018-10-25 NOTE — Evaluation (Signed)
Physical Therapy Evaluation Patient Details Name: Kristy Cabrera MRN: [id number] DOB: 1951/03/18 Today's Date: 10/25/2018   History of Present Illness  68 y.o. female that presents as a transfer from [place] with symptomatic high-grade left ICA stenosis and imaging revealed a 2 cm acute ischemic nonhemorrhagic infarct involving the subcortical and white matter of the posterior left centrum semi-ovale  Clinical Impression  Orders received for PT evaluation. Patient demonstrates deficits in functional mobility as indicated below. Will benefit from continued skilled PT to address deficits and maximize function. Will see as indicated and progress as tolerated.  Anticipate patient will need hands on assist upon acute discharge.    Follow Up Recommendations Home health PT;Supervision/Assistance - 24 hour(if family able to provide safe and adequate physical assist)    Equipment Recommendations  None recommended by PT    Recommendations for Other Services       Precautions / Restrictions Precautions Precautions: Fall Restrictions Weight Bearing Restrictions: No      Mobility  Bed Mobility               General bed mobility comments: received in recliner  Transfers Overall transfer level: Needs assistance Equipment used: Rolling walker (2 wheeled) Transfers: Sit to/from Stand Sit to Stand: Min guard;From elevated surface;Min assist         General transfer comment: MIn Guard A for safety into standing. Min A for balance once in standing  Ambulation/Gait Ambulation/Gait assistance: Min guard;Min assist Gait Distance (Feet): 90 Feet Assistive device: Rolling walker (2 wheeled) Gait Pattern/deviations: Step-through pattern;Decreased stride length;Shuffle;Narrow base of support;Drifts right/left Gait velocity: decreased Gait velocity interpretation: <1.31 ft/sec, indicative of household ambulator General Gait Details: patient with noted instability during ambulation  attempt and decreased gait speed requiring increased cues for improved cadence  Stairs            Wheelchair Mobility    Modified Rankin (Stroke Patients Only)       Balance Overall balance assessment: Needs assistance Sitting-balance support: No upper extremity supported;Feet supported Sitting balance-Leahy Scale: Good     Standing balance support: No upper extremity supported;During functional activity;Bilateral upper extremity supported Standing balance-Leahy Scale: Fair Standing balance comment: increased FOF during static standing                             Pertinent Vitals/Pain Pain Assessment: No/denies pain    Home Living Family/patient expects to be discharged to:: Private residence Living Arrangements: Spouse/significant other Available Help at Discharge: Family Type of Home: House Home Access: Stairs to enter Entrance Stairs-Rails: Psychiatric nurse of Steps: 4 Home Layout: Two level;Able to live on main level with bedroom/bathroom Home Equipment: Gilford Rile - 2 wheels;Shower seat - Multimedia programmer)      Prior Function Level of Independence: Needs assistance   Gait / Transfers Assistance Needed: ambulated with rollator at home  ADL's / Homemaking Assistance Needed: Husband perform IADLs. Assists with LB ADLs as needed        Hand Dominance   Dominant Hand: Right    Extremity/Trunk Assessment   Upper Extremity Assessment Upper Extremity Assessment: RUE deficits/detail;LUE deficits/detail RUE Deficits / Details: Decreased coorindation and grasp strength. RUE Coordination: decreased fine motor;decreased gross motor LUE Deficits / Details: Decreased coorindation and grasp strength. LUE Coordination: decreased fine motor;decreased gross motor    Lower Extremity Assessment Lower Extremity Assessment: Generalized weakness;RLE deficits/detail;LLE deficits/detail RLE Coordination: decreased fine motor;decreased gross  motor LLE Coordination: decreased fine motor;decreased  gross motor    Cervical / Trunk Assessment Cervical / Trunk Assessment: Kyphotic  Communication   Communication: HOH  Cognition Arousal/Alertness: Awake/alert Behavior During Therapy: WFL for tasks assessed/performed Overall Cognitive Status: No family/caregiver present to determine baseline cognitive functioning                                 General Comments: Pt presenting with impulsivity, decreased safety awareness, and poor problem solving.       General Comments General comments (skin integrity, edema, etc.): PAtient complaining of burning sensation in peri area    Exercises     Assessment/Plan    PT Assessment Patient needs continued PT services  PT Problem List Decreased strength;Decreased activity tolerance;Decreased balance;Decreased safety awareness       PT Treatment Interventions DME instruction;Gait training;Functional mobility training;Therapeutic activities;Therapeutic exercise;Balance training;Neuromuscular re-education;Cognitive remediation;Patient/family education    PT Goals (Current goals can be found in the Care Plan section)  Acute Rehab PT Goals Patient Stated Goal: "Go home before surgery" PT Goal Formulation: With patient Time For Goal Achievement: 11/08/18 Potential to Achieve Goals: Fair    Frequency Min 4X/week   Barriers to discharge        Co-evaluation               AM-PAC PT "6 Clicks" Mobility  Outcome Measure Help needed turning from your back to your side while in a flat bed without using bedrails?: A Little Help needed moving from lying on your back to sitting on the side of a flat bed without using bedrails?: A Little Help needed moving to and from a bed to a chair (including a wheelchair)?: A Little Help needed standing up from a chair using your arms (e.g., wheelchair or bedside chair)?: A Little Help needed to walk in hospital room?: A Little Help  needed climbing 3-5 steps with a railing? : A Little 6 Click Score: 18    End of Session Equipment Utilized During Treatment: Gait belt Activity Tolerance: Patient tolerated treatment well Patient left: in chair;with call bell/phone within reach;with chair alarm set Nurse Communication: Mobility status PT Visit Diagnosis: Other abnormalities of gait and mobility (R26.89);Other symptoms and signs involving the nervous system (R29.898)    Time: 0174-9449 PT Time Calculation (min) (ACUTE ONLY): 18 min   Charges:   PT Evaluation $PT Eval Moderate Complexity: 1 Mod          Alben Deeds, PT DPT  Board Certified Neurologic Specialist Acute Rehabilitation Services Pager [phone] Office [phone]   Duncan Dull 10/25/2018, 10:33 AM

## 2018-10-25 NOTE — Progress Notes (Signed)
Pt not ambulated per nurse's judgement. Pt rest time.  Ival Bible, BSN, RN

## 2018-10-25 NOTE — Progress Notes (Signed)
PROGRESS NOTE    Kristy Cabrera  SAY:[id number] DOB: 31-Dec-1950 DOA: 10/24/2018 PCP: [name], NP   Brief Narrative: 68 y.o. female with medical history significant of hypertension, seizure disorder, COPD, syncope, brain meningioma status post resection, alcohol abuse in remission, and syncope who presented to the emergency [facility] on April 3 with complaints of right upper extremity numbness/tingling with slurred speech.  She was on the phone with a friend and noted symptoms at 9 AM in the morning.  She was recently admitted in March to [facility] with altered mental status and generalized weakness she was diagnosed with metabolic encephalopathy.  At that time CT scan of the head was negative she also had an EEG that was unremarkable.  She has no fevers, cough, shortness of breath, chills, chest pain, abdominal pain, or lower extremity edema.  She was admitted to [facility] and underwent MRI which showed a 2 cm acute ischemic nonhemorrhagic infarct of the cortical and deep white matter of the posterior left tentorium semi-ovale with no associated hemorrhage.  She was then diagnosed with a near left MCA thrombotic stroke due to left internal carotid artery stenosis.  Dr. Carlis Abbott from vascular surgery was contacted and accepted the patient for evaluation.  He requested we admit the patient to our service.  Patient transferred here for vascular surgery to left carotid stenosis  Assessment & Plan:   Principal Problem:   Stenosis of internal carotid artery with cerebral infarction, left [facility]) Active Problems:   Alcohol abuse   Seizure disorder (HCC)   COPD (chronic obstructive pulmonary disease) (HCC)   Syncope   Acute ischemic left MCA stroke (HCC)   Cognitive impairment   1.  Stenosis of internal carotid artery with cerebral infarction of the left: Transferred from [facility] for evaluation for possible surgery with Dr. Carlis Abbott.  Carlis Abbott has seen the patient  and is planning for left carotid endarterectomy.  At this time we do not have the time or date for the surgery.  2.  Acute ischemic left MCA thrombotic stroke: Patient started on Plavix at [facility] will hold given possible surgery.  We will continue aspirin.  Defer to Dr. Carlis Abbott as to when to stop the aspirin prior to surgery.  MRI at [facility] showed 2 cm acute ischemic nonhemorrhagic infarct of the cortical and deep white matter of the posterior left tentorium semiovale with no associated hemorrhage  3.  Seizure disorder: Currently on Keppra 750 p.o. twice daily will continue.  4.  COPD: Currently stable without exacerbation we will continue home medications.  Currently not requiring MDIs.  5.  Cognitive impairment: Likely combination of alcohol abuse, seizure disorder and from stroke.  Currently on Namenda and Aricept.  Will continue same.  6.  Alcohol abuse now in remission  7.  History of of syncope: Currently stable no further episodes.   DVT prophylaxis: Lovenox Code Status: Full code Family Communication: Discussed with patient Disposition Plan: Home versus skilled nursing facility in 2 to 3 days (after surgery) Consults called: Vascular surgery Dr. Carlis Abbott    Estimated body mass index is 27.74 kg/m as calculated from the following:   Height as of 01/20/18: 5\' 4"  (1.626 m).   Weight as of this encounter: 73.3 kg.      Subjective: Up in bed trying to eat breakfast speech is fluent awake and alert eating breakfast by herself  Objective: Vitals:   10/24/18 2000 10/25/18 0016 10/25/18 0342 10/25/18 0913  BP: (!) 120/97  137/75 (!) 153/85 131/85  Pulse: 67 75 71 87  Resp: 18 18  20   Temp: 98.2 F (36.8 C) 97.7 F (36.5 C) 98.2 F (36.8 C) (!) 97.5 F (36.4 C)  TempSrc: Oral Oral Oral Oral  SpO2: 95% 96% 98% 97%  Weight:   73.3 kg     Intake/Output Summary (Last 24 hours) at 10/25/2018 0941 Last data filed at 10/25/2018 0914 Gross per 24 hour   Intake 240 ml  Output -  Net 240 ml   Filed Weights   10/25/18 0342  Weight: 73.3 kg    Examination:  General exam: Appears calm and comfortable  Respiratory system: Clear to auscultation. Respiratory effort normal. Cardiovascular system: S1 & S2 heard, RRR. No JVD, murmurs, rubs, gallops or clicks. No pedal edema. Gastrointestinal system: Abdomen is nondistended, soft and nontender. No organomegaly or masses felt. Normal bowel sounds heard. Central nervous system: Alert and oriented.  4 x 5 the right upper extremity otherwise 5 x 5 in all the other extremities Extremities: No edema Skin: No rashes, lesions or ulcers    Data Reviewed: I have personally reviewed following labs and imaging studies  CBC: Recent Labs  Lab 10/24/18 1553 10/25/18 0341  WBC 10.0 10.2  HGB 12.9 13.9  HCT 38.4 41.4  MCV 87.9 88.8  PLT 284 093   Basic Metabolic Panel: Recent Labs  Lab 10/24/18 1553 10/25/18 0341  NA  --  138  K  --  3.8  CL  --  107  CO2  --  22  GLUCOSE  --  86  BUN  --  23  CREATININE 0.65 0.75  CALCIUM  --  9.5   GFR: CrCl cannot be calculated (Unknown ideal weight.). Liver Function Tests: No results for input(s): AST, ALT, ALKPHOS, BILITOT, PROT, ALBUMIN in the last 168 hours. No results for input(s): LIPASE, AMYLASE in the last 168 hours. No results for input(s): AMMONIA in the last 168 hours. Coagulation Profile: Recent Labs  Lab 10/25/18 0341  INR 1.0   Cardiac Enzymes: No results for input(s): CKTOTAL, CKMB, CKMBINDEX, TROPONINI in the last 168 hours. BNP (last 3 results) No results for input(s): PROBNP in the last 8760 hours. HbA1C: No results for input(s): HGBA1C in the last 72 hours. CBG: No results for input(s): GLUCAP in the last 168 hours. Lipid Profile: No results for input(s): CHOL, HDL, LDLCALC, TRIG, CHOLHDL, LDLDIRECT in the last 72 hours. Thyroid Function Tests: No results for input(s): TSH, T4TOTAL, FREET4, T3FREE, THYROIDAB in the  last 72 hours. Anemia Panel: No results for input(s): VITAMINB12, FOLATE, FERRITIN, TIBC, IRON, RETICCTPCT in the last 72 hours. Sepsis Labs: No results for input(s): PROCALCITON, LATICACIDVEN in the last 168 hours.  No results found for this or any previous visit (from the past 240 hour(s)).       Radiology Studies: No results found.      Scheduled Meds: . aspirin EC  81 mg Oral Daily  . atorvastatin  80 mg Oral q1800  . donepezil  5 mg Oral QHS  . enoxaparin (LOVENOX) injection  40 mg Subcutaneous Q24H  . levETIRAcetam  750 mg Oral BID  . memantine  10 mg Oral Daily  . multivitamin with minerals  1 tablet Oral Daily  . sodium chloride flush  3 mL Intravenous Q12H   Continuous Infusions:   LOS: 1 day     Georgette Shell, MD [facility]  If 7PM-7AM, please contact night-coverage [email or web address] Password [facility] 10/25/2018, 9:41 AM

## 2018-10-25 NOTE — Progress Notes (Signed)
[facility] [place]  Subjective  - No new complaints.  Ongoing dysarthria, but not other new stroke/TIA symptoms.  States all right arm symptoms resolved.   Objective (!) 149/63 85 98.2 F (36.8 C) (Oral) 16 96%  Intake/Output Summary (Last 24 hours) at 10/25/2018 1549 Last data filed at 10/25/2018 0914 Gross per 24 hour  Intake 240 ml  Output -  Net 240 ml    CN II-XII grossly intact Dysarthria BUE and BLE symmetric and 5/5  Laboratory Lab Results: Recent Labs    10/24/18 1553 10/25/18 0341  WBC 10.0 10.2  HGB 12.9 13.9  HCT 38.4 41.4  PLT 284 296   BMET Recent Labs    10/24/18 1553 10/25/18 0341  NA  --  138  K  --  3.8  CL  --  107  CO2  --  22  GLUCOSE  --  86  BUN  --  23  CREATININE 0.65 0.75  CALCIUM  --  9.5    COAG Lab Results  Component Value Date   INR 1.0 10/25/2018   INR 0.94 07/04/2016   No results found for: PTT  Assessment/Planning:  Discussed in further detail that patient would benefit from left carotid intervention given symptomatic high grade stenosis.  Remains neuro intact except dysarthria and right upper symptoms resolved.  This morning she wanted to go home and we had discussed Thursday for surgery.  On follow-up this afternoon, states he husband does not want her home before surgery.  Discussed with her to stay NPO after midnight in case OR has any availability tomorrow, most likely Wed/Thurs.    Marty Heck 10/25/2018 3:49 PM --

## 2018-10-25 NOTE — Evaluation (Signed)
Occupational Therapy Evaluation Patient Details Name: Kristy Cabrera MRN: [id number] DOB: 09-28-1950 Today's Date: 10/25/2018    History of Present Illness 68 y.o. female that presents as a transfer from [place] with symptomatic high-grade left ICA stenosis and imaging revealed a 2 cm acute ischemic nonhemorrhagic infarct involving the subcortical and white matter of the posterior left centrum semi-ovale   Clinical Impression   PTA, pt was living her husband and was performing BADLs and using a rollator for functional mobility. Pt currently requiring Min A during ADLs and functional mobility. Pt presenting with decreased balance, coordination, and cognition. Pt with reporting she is planning for vascular surgery on Thursday. Pt would benefit from further acute OT to facilitate safe dc. Pending conformation of support at home, recommend dc to home with [facility] for further OT to optimize safety, independence with ADLs, and return to PLOF.      Follow Up Recommendations  Home health OT;Supervision/Assistance - 24 hour    Equipment Recommendations  None recommended by OT    Recommendations for Other Services PT consult     Precautions / Restrictions Precautions Precautions: Fall Restrictions Weight Bearing Restrictions: No      Mobility Bed Mobility Overal bed mobility: Needs Assistance Bed Mobility: Supine to Sit     Supine to sit: Min guard;HOB elevated     General bed mobility comments: Min Guard A for safety  Transfers Overall transfer level: Needs assistance Equipment used: Rolling walker (2 wheeled) Transfers: Sit to/from Stand Sit to Stand: Min guard;From elevated surface;Min assist         General transfer comment: MIn Guard A for safety into standing. Min A for balance once in standing    Balance Overall balance assessment: Needs assistance Sitting-balance support: No upper extremity supported;Feet supported Sitting balance-Leahy Scale: Good     Standing  balance support: No upper extremity supported;During functional activity;Bilateral upper extremity supported Standing balance-Leahy Scale: Fair Standing balance comment: Able to maintain static standing                           ADL either performed or assessed with clinical judgement   ADL Overall ADL's : Needs assistance/impaired Eating/Feeding: Set up;Supervision/ safety;Sitting   Grooming: Set up;Supervision/safety;Sitting   Upper Body Bathing: Minimal assistance;Sitting Upper Body Bathing Details (indicate cue type and reason): Min A for washing pt's back Lower Body Bathing: Minimal assistance;Sit to/from stand Lower Body Bathing Details (indicate cue type and reason): Min A for standing balance while pt washed peri area Upper Body Dressing : Minimal assistance;Sitting;Standing Upper Body Dressing Details (indicate cue type and reason): Pt donned new gown and rob with Min A. Lower Body Dressing: Minimal assistance Lower Body Dressing Details (indicate cue type and reason): Min A for donning underwear over LLE. Pt stating "thats my stupid leg that doesn't work right." Armed forces technical officer: Minimal assistance;Ambulation;RW(Simulated to recliner) Armed forces technical officer Details (indicate cue type and reason): Min A for balance         Functional mobility during ADLs: Minimal assistance;Rolling walker General ADL Comments: Pt presenting with decreased balance, strength, coorindation, and safety     Vision         Perception     Praxis      Pertinent Vitals/Pain Pain Assessment: No/denies pain     Hand Dominance Right   Extremity/Trunk Assessment Upper Extremity Assessment Upper Extremity Assessment: RUE deficits/detail;LUE deficits/detail RUE Deficits / Details: Decreased coorindation and grasp strength. RUE Coordination: decreased fine motor;decreased  gross motor LUE Deficits / Details: Decreased coorindation and grasp strength. LUE Coordination: decreased fine  motor;decreased gross motor   Lower Extremity Assessment Lower Extremity Assessment: Defer to PT evaluation   Cervical / Trunk Assessment Cervical / Trunk Assessment: Kyphotic   Communication Communication Communication: HOH   Cognition Arousal/Alertness: Awake/alert Behavior During Therapy: WFL for tasks assessed/performed Overall Cognitive Status: No family/caregiver present to determine baseline cognitive functioning                                 General Comments: Pt presenting with impulsivity, decreased safety awareness, and poor problem solving.    General Comments  Pt with urinary incontience in bed. Pt reporting she called earlier to get help to clean and reports they said someone is coming. Pt also stating "they said don't get up so I wasn't getting up."    Exercises     Shoulder Instructions      Home Living Family/patient expects to be discharged to:: Private residence Living Arrangements: Spouse/significant other Available Help at Discharge: Family Type of Home: House Home Access: Stairs to enter [employer] of Steps: 4 Entrance Stairs-Rails: Right;Left Home Layout: Two level;Able to live on main level with bedroom/bathroom     Bathroom Shower/Tub: Hospital doctor Toilet: Standard("My husband got me the high ones")     Home Equipment: Environmental consultant - 2 wheels;Shower seat - Multimedia programmer)          Prior Functioning/Environment Level of Independence: Needs assistance  Gait / Transfers Assistance Needed: ambulated with rollator at home ADL's / Homemaking Assistance Needed: Husband perform IADLs. Assists with LB ADLs as needed            OT Problem List: Decreased strength;Decreased range of motion;Decreased activity tolerance;Impaired balance (sitting and/or standing);Decreased knowledge of use of DME or AE;Decreased knowledge of precautions;Decreased coordination;Decreased safety awareness      OT  Treatment/Interventions: Self-care/ADL training;Therapeutic exercise;Energy conservation;DME and/or AE instruction;Therapeutic activities;Patient/family education    OT Goals(Current goals can be found in the care plan section) Acute Rehab OT Goals Patient Stated Goal: "Go home before surgery" OT Goal Formulation: With patient Time For Goal Achievement: 11/08/18 Potential to Achieve Goals: Good  OT Frequency: Min 2X/week   Barriers to D/C:            Co-evaluation              AM-PAC OT "6 Clicks" Daily Activity     Outcome Measure Help from another person eating meals?: None Help from another person taking care of personal grooming?: A Little Help from another person toileting, which includes using toliet, bedpan, or urinal?: A Little Help from another person bathing (including washing, rinsing, drying)?: A Little Help from another person to put on and taking off regular upper body clothing?: A Little Help from another person to put on and taking off regular lower body clothing?: A Little 6 Click Score: 19   End of Session Equipment Utilized During Treatment: Gait belt;Rolling walker Nurse Communication: Mobility status  Activity Tolerance: Patient tolerated treatment well Patient left: in chair;with call bell/phone within reach;with chair alarm set  OT Visit Diagnosis: Unsteadiness on feet (R26.81);Other abnormalities of gait and mobility (R26.89);Muscle weakness (generalized) (M62.81)                Time: 8756-4332 OT Time Calculation (min): 25 min Charges:  OT General Charges $OT Visit: 1 Visit OT Evaluation $  OT Eval Moderate Complexity: 1 Mod OT Treatments $Self Care/Home Management : 8-22 mins  Natania Finigan MSOT, OTR/L Acute Rehab Pager: [phone] Office: [place] 10/25/2018, 9:26 AM

## 2018-10-25 NOTE — Evaluation (Signed)
Clinical/Bedside Swallow Evaluation Patient Details  Name: Kristy Cabrera MRN: [id number] Date of Birth: [birth year]  Today's Date: 10/25/2018 Time: SLP Start Time (ACUTE ONLY): 1255 SLP Stop Time (ACUTE ONLY): 1310 SLP Time Calculation (min) (ACUTE ONLY): 15 min  Past Medical History:  Past Medical History:  Diagnosis Date  . Brain tumor (benign) ([place])   . Episodic mood disorder ([place])   . Hyperlipidemia   . Hypertension   . Insomnia   . Osteopenia   . Weakness of both legs    Past Surgical History:  Past Surgical History:  Procedure Laterality Date  . Brain tumor removal    . MELANOMA EXCISION  1987   BACK   HPI:  68 y.o. female with medical history significant of hypertension, seizure disorder, COPD, syncope, brain meningioma status post resection, alcohol abuse in remission, and syncope who presented to the emergency [facility] on April 3 with complaints of right upper extremity numbness/tingling with slurred speech. Pt was found to have symptomatic high-grade left ICA stenosis and imaging revealed a 2 cm acute ischemic nonhemorrhagic infarct involving the subcortical and white matter of the posterior left centrum semi-ovale   Assessment / Plan / Recommendation Clinical Impression  Pts swallow appears within functional limits. Oral motor exam was unremarkable, however dysarthia exhibited post CVA. No overt s/sx of aspiration were exhibited with any PO. Continue regualr thin liquid diet with aspiration precautions. SLP to follow up for speech language evaluation.    SLP Visit Diagnosis: Dysphagia, unspecified (R13.10)    Aspiration Risk  Mild aspiration risk    Diet Recommendation   Regular, thin liquids  Medication Administration: Whole meds with liquid    Other  Recommendations Oral Care Recommendations: Oral care BID   Follow up Recommendations 24 hour supervision/assistance      Frequency and Duration            Prognosis Prognosis for Safe  Diet Advancement: Good      Swallow Study   General HPI: 68 y.o. female with medical history significant of hypertension, seizure disorder, COPD, syncope, brain meningioma status post resection, alcohol abuse in remission, and syncope who presented to the emergency department [facility] on April 3 with complaints of right upper extremity numbness/tingling with slurred speech. Pt was found to have symptomatic high-grade left ICA stenosis and imaging revealed a 2 cm acute ischemic nonhemorrhagic infarct involving the subcortical and white matter of the posterior left centrum semi-ovale Type of Study: Bedside Swallow Evaluation Previous Swallow Assessment: no prior intervention on file  Diet Prior to this Study: Regular;Thin liquids Temperature Spikes Noted: No Respiratory Status: Room air History of Recent Intubation: No Behavior/Cognition: Alert;Cooperative Oral Cavity Assessment: Within Functional Limits Oral Care Completed by SLP: No Oral Cavity - Dentition: Adequate natural dentition Vision: Functional for self-feeding Self-Feeding Abilities: Able to feed self Patient Positioning: Upright in chair Baseline Vocal Quality: Normal Volitional Cough: Strong Volitional Swallow: Able to elicit    Oral/Motor/Sensory Function Overall Oral Motor/Sensory Function: Within functional limits   Ice Chips Ice chips: Within functional limits   Thin Liquid Thin Liquid: Within functional limits    Nectar Thick Nectar Thick Liquid: Not tested   Honey Thick Honey Thick Liquid: Not tested   Puree Puree: Within functional limits   Solid     Solid: Within functional limits      Hailey Stormer E Bj Morlock MA, CCC-SLP Acute Rehab Speech Language Pathologist  10/25/2018,1:56 PM

## 2018-10-26 DIAGNOSIS — R55 Syncope and collapse: Secondary | ICD-10-CM

## 2018-10-26 DIAGNOSIS — G40909 Epilepsy, unspecified, not intractable, without status epilepticus: Secondary | ICD-10-CM

## 2018-10-26 DIAGNOSIS — F1010 Alcohol abuse, uncomplicated: Secondary | ICD-10-CM

## 2018-10-26 DIAGNOSIS — J449 Chronic obstructive pulmonary disease, unspecified: Secondary | ICD-10-CM

## 2018-10-26 DIAGNOSIS — I63512 Cerebral infarction due to unspecified occlusion or stenosis of left middle cerebral artery: Secondary | ICD-10-CM

## 2018-10-26 DIAGNOSIS — R4189 Other symptoms and signs involving cognitive functions and awareness: Secondary | ICD-10-CM

## 2018-10-26 LAB — URINALYSIS, ROUTINE W REFLEX MICROSCOPIC
Bacteria, UA: NONE SEEN
Bilirubin Urine: NEGATIVE
Glucose, UA: NEGATIVE mg/dL
Hgb urine dipstick: NEGATIVE
Ketones, ur: NEGATIVE mg/dL
Nitrite: NEGATIVE
Protein, ur: NEGATIVE mg/dL
Specific Gravity, Urine: 1.021 (ref 1.005–1.030)
pH: 6 (ref 5.0–8.0)

## 2018-10-26 NOTE — Progress Notes (Signed)
  Speech Language Pathology Treatment: Dysphagia  Patient Details Name: Kristy Cabrera MRN: [id number] DOB: July 19, 1951 Today's Date: 10/26/2018 Time: 1051-1100 SLP Time Calculation (min) (ACUTE ONLY): 9 min  Assessment / Plan / Recommendation Clinical Impression  Pt with excellent tolerance of po intake, Intake listed as 100% and pt denies any issues with swallowing. SLP would recommend pt's swallowing be monitored closely after her CEA this week.    Educated pt to aspiration precautions - no SLP follow up regarding her swallowing but she will need SLP ataxia speech treatment.     HPI HPI: 68 y.o. female with medical history significant of hypertension, seizure disorder, COPD, syncope, brain meningioma status post resection, alcohol abuse in remission, and syncope who presented to the emergency [facility] on April 3 with complaints of right upper extremity numbness/tingling with slurred speech. Pt was found to have symptomatic high-grade left ICA stenosis and imaging revealed a 2 cm acute ischemic nonhemorrhagic infarct involving the subcortical and white matter of the posterior left centrum semi-ovale.        SLP Plan  (no follow up regarding swallowing)  Patient needs continued Speech Lanaguage Pathology Services    Recommendations  Diet recommendations: Regular;Thin liquid Liquids provided via: Straw;Cup Medication Administration: Whole meds with liquid Supervision: Patient able to self feed                Oral Care Recommendations: Oral care BID Follow up Recommendations: Home health SLP SLP Visit Diagnosis: Dysarthria and anarthria (R47.1) Plan: (no follow up regarding swallowing)       GO                Kristy Cabrera 10/26/2018, 11:52 AM  Kristy Cabrera, [facility] SLP Acute Rehab Services Pager [phone] Office [phone]

## 2018-10-26 NOTE — Progress Notes (Signed)
[facility]  Subjective  - No new complaints.  Ongoing dysarthria, but not other new stroke/TIA symptoms.      Objective (!) 129/59 81 98 F (36.7 C) (Oral) 20 92%  Intake/Output Summary (Last 24 hours) at 10/26/2018 1454 Last data filed at 10/26/2018 0838 Gross per 24 hour  Intake 480 ml  Output -  Net 480 ml    CN II-XII grossly intact Dysarthria BUE and BLE symmetric and 5/5  Laboratory Lab Results: Recent Labs    10/24/18 1553 10/25/18 0341  WBC 10.0 10.2  HGB 12.9 13.9  HCT 38.4 41.4  PLT 284 296   BMET Recent Labs    10/24/18 1553 10/25/18 0341  NA  --  138  K  --  3.8  CL  --  107  CO2  --  22  GLUCOSE  --  86  BUN  --  23  CREATININE 0.65 0.75  CALCIUM  --  9.5    COAG Lab Results  Component Value Date   INR 1.0 10/25/2018   INR 0.94 07/04/2016   No results found for: PTT  Assessment/Planning:  Discussed in further detail that patient would benefit from left carotid intervention given symptomatic high grade stenosis.  Remains neuro intact except dysarthria and right upper symptoms resolved.  OR Thursday for left carotid endarterectomy.  Risks and benefits discussed in detail.  Marty Heck 10/26/2018 2:54 PM --

## 2018-10-26 NOTE — Progress Notes (Signed)
Physical Therapy Treatment Patient Details Name: Kristy Cabrera MRN: [id number] DOB: 13-Apr-1951 Today's Date: 10/26/2018    History of Present Illness 68 y.o. female that presents as a transfer from [place] with symptomatic high-grade left ICA stenosis and imaging revealed a 2 cm acute ischemic nonhemorrhagic infarct involving the subcortical and white matter of the posterior left centrum semi-ovale    PT Comments    Pt performed gait training and functional mobility during session with min assistance.  Pt sleeping on arrival and presents with slurred speech.  Pt able to ambulate but more fatigued this afternoon.  Pt continues to benefit from f/u HHPT and adequate and safe support.     Follow Up Recommendations  Home health PT;Supervision/Assistance - 24 hour(If family able to provided adequate and safe physical assistance.)     Equipment Recommendations  None recommended by PT    Recommendations for Other Services       Precautions / Restrictions Precautions Precautions: Fall Restrictions Weight Bearing Restrictions: No    Mobility  Bed Mobility Overal bed mobility: Needs Assistance Bed Mobility: Rolling;Sidelying to Sit Rolling: Min assist Sidelying to sit: Mod assist       General bed mobility comments: Pt required assistance to advance LEs and elevate trunk into sitting.  Presents with posterior lean and required assistance to ground B LEs.    Transfers Overall transfer level: Needs assistance Equipment used: Rolling walker (2 wheeled) Transfers: Sit to/from Stand Sit to Stand: From elevated surface;Min assist         General transfer comment: MIn assistance to boost into standing, patient presents with poor eccentric loading.    Ambulation/Gait Ambulation/Gait assistance: Min guard;Min assist Gait Distance (Feet): 60 Feet Assistive device: Rolling walker (2 wheeled) Gait Pattern/deviations: Step-through pattern;Decreased stride length;Shuffle;Narrow base  of support;Drifts right/left Gait velocity: decreased   General Gait Details: patient with noted instability during ambulation attempt and decreased gait speed requiring increased cues for improved cadence, posture and RW safety.     Stairs             Wheelchair Mobility    Modified Rankin (Stroke Patients Only)       Balance Overall balance assessment: Needs assistance   Sitting balance-Leahy Scale: Good     Standing balance support: No upper extremity supported;During functional activity;Bilateral upper extremity supported Standing balance-Leahy Scale: Fair Standing balance comment: increased FOF during static standing                            Cognition Arousal/Alertness: Awake/alert Behavior During Therapy: WFL for tasks assessed/performed Overall Cognitive Status: No family/caregiver present to determine baseline cognitive functioning                                 General Comments: Pt presenting with impulsivity, decreased safety awareness, and poor problem solving.       Exercises      General Comments        Pertinent Vitals/Pain Pain Assessment: Faces Faces Pain Scale: Hurts even more Pain Location: B knees, generalized pain.  Pain Descriptors / Indicators: Discomfort;Grimacing;Guarding;Moaning Pain Intervention(s): Monitored during session;Repositioned    Home Living                      Prior Function            PT Goals (current goals can now be found  in the care plan section) Acute Rehab PT Goals Patient Stated Goal: "Take a nap" Potential to Achieve Goals: Fair Progress towards PT goals: Progressing toward goals    Frequency    Min 4X/week      PT Plan Current plan remains appropriate    Co-evaluation              AM-PAC PT "6 Clicks" Mobility   Outcome Measure  Help needed turning from your back to your side while in a flat bed without using bedrails?: A Little Help needed  moving from lying on your back to sitting on the side of a flat bed without using bedrails?: A Little Help needed moving to and from a bed to a chair (including a wheelchair)?: A Little Help needed standing up from a chair using your arms (e.g., wheelchair or bedside chair)?: A Little Help needed to walk in hospital room?: A Little Help needed climbing 3-5 steps with a railing? : A Little 6 Click Score: 18    End of Session Equipment Utilized During Treatment: Gait belt Activity Tolerance: Patient tolerated treatment well Patient left: with call bell/phone within reach;in bed;with bed alarm set Nurse Communication: Mobility status PT Visit Diagnosis: Other abnormalities of gait and mobility (R26.89);Other symptoms and signs involving the nervous system (R29.898)     Time: 1351-1410 PT Time Calculation (min) (ACUTE ONLY): 19 min  Charges:  $Gait Training: 8-22 mins                     Governor Rooks, PTA Acute Rehabilitation Services Pager [phone] Office [phone]     Ragen Laver Eli Hose 10/26/2018, 3:33 PM

## 2018-10-26 NOTE — Progress Notes (Signed)
PROGRESS NOTE    Kristy Cabrera  HDQ:[id number] DOB: March 01, 1951 DOA: 10/24/2018 PCP: Kristy Iba, NP   Brief Narrative:  HPI On 10/24/2018 by Dr. Randa Spike Kristy Cabrera is a 68 y.o. female with medical history significant of hypertension, seizure disorder, COPD, syncope, brain meningioma status post resection, alcohol abuse in remission, and syncope who presented to the emergency [facility] on April 3 with complaints of right upper extremity numbness/tingling with slurred speech.  She was on the phone with a friend and noted symptoms at 9 AM in the morning.  She was recently admitted in March to [facility] with altered mental status and generalized weakness she was diagnosed with metabolic encephalopathy.  At that time CT scan of the head was negative she also had an EEG that was unremarkable.  She has no fevers, cough, shortness of breath, chills, chest pain, abdominal pain, or lower extremity edema.  She was admitted to [facility] and underwent MRI which showed a 2 cm acute ischemic nonhemorrhagic infarct of the cortical and deep white matter of the posterior left tentorium semi-ovale with no associated hemorrhage.  She was then diagnosed with a near left MCA thrombotic stroke due to left internal carotid artery stenosis.  Dr. Carlis Abbott from vascular surgery was contacted and accepted the patient for evaluation.  He requested we admit the patient to our service.  Patient transferred here for vascular surgery to left carotid stenosis  Interim history Patient was transferred from [facility] for CVA and the need for vascular surgery intervention.  Surgery on left ICA likely later this week. Assessment & Plan   Left internal carotid artery stenosis with cerebral infarction -Patient transferred from [facility] for vascular surgery consultation -Dr. Carlis Abbott consulted and appreciated, planning for left carotid endarterectomy later this week  Acute  ischemic left MCA thrombotic stroke -Placed on Plavix at outside hospital but on hold given possible surgery -Currently on aspirin, statin -MRI at outside hospital showed 2 cm acute ischemic nonhemorrhagic infarct of the cortical and deep white matter of the posterior left tentorium semiovale with no associated hemorrhage  Seizure disorder -Stable, continue Keppra  COPD -Stable and without exacerbation, continue home medications  Cognitive impairment -Likely combination of alcohol abuse, seizure disorder, recent CVA -Continue Namenda, Aricept -Currently alert and oriented x3  Alcohol abuse -Currently intermission, no signs or symptoms of withdrawal  History of syncope -Stable no further episodes  DVT Prophylaxis  lovenox  Code Status: Full  Family Communication: None at bedside  Disposition Plan: Admitted. Pending vascular surgery  Consultants Vascular surgery  Procedures  None  Antibiotics   Anti-infectives (From admission, onward)   None      Subjective:   Kristy Cabrera seen and examined today.  Patient with no complaints this.  Denies current chest pain, shortness breath, abdominal pain, nausea or vomiting, diarrhea constipation.  Objective:   Vitals:   10/25/18 1604 10/25/18 1948 10/26/18 0010 10/26/18 0405  BP: (!) 146/84 (!) 161/64 (!) 156/58 (!) 150/55  Pulse: 91 70 73 69  Resp: 16 17 18 18   Temp: 97.8 F (36.6 C) 98 F (36.7 C) 98.6 F (37 C) 97.7 F (36.5 C)  TempSrc: Oral Oral Oral Oral  SpO2: 99% 97% 95% 97%  Weight:        Intake/Output Summary (Last 24 hours) at 10/26/2018 0947 Last data filed at 10/26/2018 0838 Gross per 24 hour  Intake 480 ml  Output -  Net 480 ml   [employer]  10/25/18 0342  Weight: 73.3 kg    Exam  General: Well developed, well nourished, NAD  HEENT: NCAT,  mucous membranes moist.   Neck: Supple  Cardiovascular: S1 S2 auscultated, RRR, no murmur  Respiratory: Clear to auscultation bilaterally   Abdomen: Soft, nontender, nondistended, + bowel sounds  Extremities: warm dry without cyanosis clubbing or edema  Neuro: AAOx3, strength 4/5 RUE, otherwise 5/5. Speech understandable but mildly dysarthric   Psych: Appropriate mood and affect   Data Reviewed: I have personally reviewed following labs and imaging studies  CBC: Recent Labs  Lab 10/24/18 1553 10/25/18 0341  WBC 10.0 10.2  HGB 12.9 13.9  HCT 38.4 41.4  MCV 87.9 88.8  PLT 284 025   Basic Metabolic Panel: Recent Labs  Lab 10/24/18 1553 10/25/18 0341  NA  --  138  K  --  3.8  CL  --  107  CO2  --  22  GLUCOSE  --  86  BUN  --  23  CREATININE 0.65 0.75  CALCIUM  --  9.5   GFR: CrCl cannot be calculated (Unknown ideal weight.). Liver Function Tests: No results for input(s): AST, ALT, ALKPHOS, BILITOT, PROT, ALBUMIN in the last 168 hours. No results for input(s): LIPASE, AMYLASE in the last 168 hours. No results for input(s): AMMONIA in the last 168 hours. Coagulation Profile: Recent Labs  Lab 10/25/18 0341  INR 1.0   Cardiac Enzymes: No results for input(s): CKTOTAL, CKMB, CKMBINDEX, TROPONINI in the last 168 hours. BNP (last 3 results) No results for input(s): PROBNP in the last 8760 hours. HbA1C: No results for input(s): HGBA1C in the last 72 hours. CBG: No results for input(s): GLUCAP in the last 168 hours. Lipid Profile: No results for input(s): CHOL, HDL, LDLCALC, TRIG, CHOLHDL, LDLDIRECT in the last 72 hours. Thyroid Function Tests: No results for input(s): TSH, T4TOTAL, FREET4, T3FREE, THYROIDAB in the last 72 hours. Anemia Panel: No results for input(s): VITAMINB12, FOLATE, FERRITIN, TIBC, IRON, RETICCTPCT in the last 72 hours. Urine analysis:    Component Value Date/Time   COLORURINE YELLOW 10/26/2018 0310   APPEARANCEUR CLEAR 10/26/2018 0310   LABSPEC 1.021 10/26/2018 0310   PHURINE 6.0 10/26/2018 0310   GLUCOSEU NEGATIVE 10/26/2018 0310   HGBUR NEGATIVE 10/26/2018 0310    BILIRUBINUR NEGATIVE 10/26/2018 0310   KETONESUR NEGATIVE 10/26/2018 0310   PROTEINUR NEGATIVE 10/26/2018 0310   NITRITE NEGATIVE 10/26/2018 0310   LEUKOCYTESUR MODERATE (A) 10/26/2018 0310   Sepsis Labs: @LABRCNTIP (procalcitonin:4,lacticidven:4)  )No results found for this or any previous visit (from the past 240 hour(s)).    Radiology Studies: No results found.   Scheduled Meds: . aspirin EC  81 mg Oral Daily  . atorvastatin  80 mg Oral q1800  . donepezil  5 mg Oral QHS  . enoxaparin (LOVENOX) injection  40 mg Subcutaneous Q24H  . levETIRAcetam  750 mg Oral BID  . memantine  10 mg Oral Daily  . multivitamin with minerals  1 tablet Oral Daily  . sodium chloride flush  3 mL Intravenous Q12H   Continuous Infusions:   LOS: 2 days   Time Spent in minutes   30 minutes  Tannor Pyon D.O. on 10/26/2018 at 9:47 AM  Between 7am to 7pm - Please see pager noted on [email or web address]  After 7pm go to [email or web address]  And look for the night coverage person covering for me after hours  [facility] Office  [phone]

## 2018-10-26 NOTE — Evaluation (Signed)
Speech Language Pathology Evaluation Patient Details Name: Kristy Cabrera MRN: [id number] DOB: [birth year] Today's Date: 10/26/2018 Time: 1031-1050 SLP Time Calculation (min) (ACUTE ONLY): 19 min  Problem List:  Patient Active Problem List   Diagnosis Date Noted  . Stenosis of internal carotid artery with cerebral infarction, left ([place]) 10/24/2018  . Seizure disorder ([place]) 10/24/2018  . COPD (chronic obstructive pulmonary disease) ([place]) 10/24/2018  . Syncope 10/24/2018  . Acute ischemic left MCA stroke ([place]) 10/24/2018  . Cognitive impairment 10/24/2018  . Confusion   . Acute hypokalemia   . Hydrochlorothiazide adverse reaction   . Hyponatremia 07/04/2016  . Acute hyponatremia   . Alcohol abuse   . Alcohol withdrawal syndrome with complication ([place])   . Acute encephalopathy    Past Medical History:  Past Medical History:  Diagnosis Date  . Brain tumor (benign) ([place])   . Episodic mood disorder ([place])   . Hyperlipidemia   . Hypertension   . Insomnia   . Osteopenia   . Weakness of both legs    Past Surgical History:  Past Surgical History:  Procedure Laterality Date  . Brain tumor removal    . MELANOMA EXCISION  1987   BACK   HPI:  68 y.o. female with medical history significant of hypertension, seizure disorder, COPD, syncope, brain meningioma status post resection, alcohol abuse in remission, and syncope who presented to the emergency [facility] on April 3 with complaints of right upper extremity numbness/tingling with slurred speech. Pt was found to have symptomatic high-grade left ICA stenosis and imaging revealed a 2 cm acute ischemic nonhemorrhagic infarct involving the subcortical and white matter of the posterior left centrum semi-ovale.     Assessment / Plan / Recommendation Clinical Impression  Pt presents with score of 22/30 on MOCA indicative of mild cognitive impairment.  She was also sleepy which may have impacted her participation.  Moderate  ataxic dysarthria noted with imprecise articulation resulting in decreased intelligibility = most notably beyond 2 - 3 word phrases.  She demonstrates rapid rate of imprecise speech.  She will benefit from follow up SLP to maximize her speech skills to decrease caregiver burden.      SLP Assessment  SLP Recommendation/Assessment: Patient needs continued Speech Lanaguage Pathology Services SLP Visit Diagnosis: Dysarthria and anarthria (R47.1)    Follow Up Recommendations  Home health SLP    Frequency and Duration min 1 x/week  1 week      SLP Evaluation Cognition  Overall Cognitive Status: No family/caregiver present to determine baseline cognitive functioning Arousal/Alertness: Awake/alert Orientation Level: Oriented X4 Attention: Sustained Sustained Attention: Appears intact Memory: Impaired Memory Impairment: Retrieval deficit Awareness: Appears intact Problem Solving: Impaired Problem Solving Impairment: Functional complex Safety/Judgment: Appears intact       Comprehension  Auditory Comprehension Overall Auditory Comprehension: Appears within functional limits for tasks assessed Yes/No Questions: Not tested Commands: Within Functional Limits Conversation: Complex Interfering Components: Hearing EffectiveTechniques: Increased volume Visual Recognition/Discrimination Discrimination: Within Function Limits Reading Comprehension Reading Status: Within funtional limits    Expression Expression Primary Mode of Expression: Verbal Verbal Expression Overall Verbal Expression: Appears within functional limits for tasks assessed Initiation: No impairment Repetition: No impairment Naming: Impairment Responsive: Not tested Convergent: Not tested Divergent: Not tested Other Naming Comments: 2/3 animals named correctly, 1/3 with initial letter cue Non-Verbal Means of Communication: Not applicable Written Expression Dominant Hand: Right Written Expression: Within Functional  Limits   Oral / Motor  Oral Motor/Sensory Function Overall Oral Motor/Sensory Function: Within functional  limits Motor Speech Overall Motor Speech: Impaired Respiration: Within functional limits Phonation: Normal Resonance: Within functional limits Articulation: Impaired Level of Impairment: Phrase Intelligibility: Intelligibility reduced Word: 75-100% accurate Phrase: 50-74% accurate Sentence: 50-74% accurate Conversation: 25-49% accurate Motor Planning: Witnin functional limits Motor Speech Errors: Not applicable Effective Techniques: Slow rate;Over-articulate   GO                    Macario Golds 10/26/2018, 11:49 AM   [name], MS [facility] SLP Acute Rehab Services Pager [phone] Office [phone]

## 2018-10-27 LAB — URINE CULTURE: Culture: <10000 — AB

## 2018-10-27 MED ORDER — CEFAZOLIN SODIUM-DEXTROSE 1-4 GM/50ML-% IV SOLN: 1.0000 g | INTRAVENOUS | Status: AC

## 2018-10-27 NOTE — TOC Initial Note (Addendum)
Transition of Care [facility]) - Initial/Assessment Note    Patient Details  Name: Kristy Cabrera MRN: [id number] Date of Birth: May 22, 1951  Transition of Care [facility]) CM/SW Contact:    Pollie Friar, RN Phone Number: 10/27/2018, 12:42 PM  Clinical Narrative:                 CM called and spoke to pts spouse with her permission. Pt with some dysarthria.   Expected Discharge Plan: [place] Barriers to Discharge: Continued Medical Work up   Patient Goals and CMS Choice   CMS Medicare.gov Compare Post Acute Care list provided to:: Patient Choice offered to / list presented to : Patient, Spouse  Expected Discharge Plan and Services Expected Discharge Plan: [place]   Discharge Planning Services: CM Consult Post Acute Care Choice: [place] arrangements for the past 2 months: Single Family Home(2 levels but they dont use the upper level)                     HH Arranged: PT, OT, Speech Therapy HH Agency: [place] [facility]  Prior Living Arrangements/Services Living arrangements for the past 2 months: Single Family Home(2 levels but they dont use the upper level) Lives with:: Spouse Patient language and need for interpreter reviewed:: Yes(no needs) Do you feel safe going back to the place where you live?: Yes      Need for Family Participation in Patient Care: Yes (Comment)(24 hour supervision recommended) Care giver support system in place?: Yes (comment)(spouse says he can provide 24 hour supervision) Current home services: DME, Home PT(cane, walker, shower chair// active with [facility]) Criminal Activity/Legal Involvement Pertinent to Current Situation/Hospitalization: No - Comment as needed  Activities of Daily Living      Permission Sought/Granted                  Emotional Assessment Appearance:: Appears stated age Attitude/Demeanor/Rapport: Inconsistent Affect (typically observed):  Pleasant Orientation: : Oriented to Self   Psych Involvement: No (comment)  Admission diagnosis:  CVA Patient Active Problem List   Diagnosis Date Noted  . Stenosis of internal carotid artery with cerebral infarction, left ([place]) 10/24/2018  . Seizure disorder ([place]) 10/24/2018  . COPD (chronic obstructive pulmonary disease) ([place]) 10/24/2018  . Syncope 10/24/2018  . Acute ischemic left MCA stroke ([place]) 10/24/2018  . Cognitive impairment 10/24/2018  . Confusion   . Acute hypokalemia   . Hydrochlorothiazide adverse reaction   . Hyponatremia 07/04/2016  . Acute hyponatremia   . Alcohol abuse   . Alcohol withdrawal syndrome with complication ([place])   . Acute encephalopathy    PCP:  [name], NP Pharmacy:   [facility], [place] [address] Phone: [phone] Fax: [phone]     Social Determinants of Health (SDOH) Interventions  Pt's spouse provides transportation.  Spouse denies any issues with home meds.   Readmission Risk Interventions No flowsheet data found.

## 2018-10-27 NOTE — Progress Notes (Signed)
Occupational Therapy Treatment Patient Details Name: Kristy Cabrera MRN: [id number] DOB: [birth year] Today's Date: 10/27/2018    History of present illness 68 y.o. female that presents as a transfer from [place] with symptomatic high-grade left ICA stenosis and imaging revealed a 2 cm acute ischemic nonhemorrhagic infarct involving the subcortical and white matter of the posterior left centrum semi-ovale   OT comments  Pt seen by OT for session focusing on basic ADL tasks as well as functional mobility (bed mobility, sit to stand and toilet transfers.  Pt with improving balance and mobility however continues to require cues for impulsivity, safety and thoroughness.    Follow Up Recommendations  Home health OT;Supervision/Assistance - 24 hour    Equipment Recommendations  None recommended by OT    Recommendations for Other Services      Precautions / Restrictions Precautions Precautions: Fall Restrictions Weight Bearing Restrictions: No       Mobility Bed Mobility Overal bed mobility: Needs Assistance Bed Mobility: Rolling;Sidelying to Sit Rolling: Supervision Sidelying to sit: Min assist       General bed mobility comments: Pt needs cues for sequencing as well as cues to push with UE's vs pulling on bed rails.   Transfers Overall transfer level: Needs assistance Equipment used: Rolling walker (2 wheeled) Transfers: Sit to/from Stand Sit to Stand: Supervision;From elevated surface         General transfer comment: Pt needed encouragement and extra time but able to do with close supervision    Balance Overall balance assessment: Needs assistance Sitting-balance support: No upper extremity supported;Feet supported Sitting balance-Leahy Scale: Good     Standing balance support: No upper extremity supported;Bilateral upper extremity supported;During functional activity Standing balance-Leahy Scale: Fair                             ADL either performed  or assessed with clinical judgement   ADL Overall ADL's : Needs assistance/impaired     Grooming: Set up;Wash/dry face;Oral care;Applying deodorant;Supervision/safety;Sitting(cues to engage and for thoroughness)   Upper Body Bathing: Set up;Supervision/ safety;Sitting Upper Body Bathing Details (indicate cue type and reason): Min A for washing pt's back Lower Body Bathing: Minimal assistance;Sit to/from stand(for bottom) Lower Body Bathing Details (indicate cue type and reason): supervision for standing during LB bathing Upper Body Dressing : Set up Upper Body Dressing Details (indicate cue type and reason): donned gown - assisted with telemetry box only     Toilet Transfer: Supervision/safety;RW;BSC;Ambulation(cues for safety) Toilet Transfer Details (indicate cue type and reason): Pt was incontinent of bowel part way through transfer.  Pt with sudden urgency for bowel movement         Functional mobility during ADLs: Supervision/safety;Rolling walker;Cueing for safety General ADL Comments: Pt with improving balance however continues to require cues and very close supervision     Vision       Perception     Praxis      Cognition Arousal/Alertness: Awake/alert Behavior During Therapy: WFL for tasks assessed/performed Overall Cognitive Status: No family/caregiver present to determine baseline cognitive functioning                                 General Comments: Pt presenting with impulsivity, decreased safety awareness, and poor problem solving.         Exercises     Shoulder Instructions       General Comments  Pertinent Vitals/ Pain       Pain Assessment: No/denies pain(Pt intermittently yelling out but denied pain when asked)  Home Living                                          Prior Functioning/Environment              Frequency  Min 2X/week        Progress Toward Goals  OT Goals(current goals can now be  found in the care plan section)     Acute Rehab OT Goals Patient Stated Goal: "Take a nap" OT Goal Formulation: With patient Time For Goal Achievement: 11/08/18 Potential to Achieve Goals: Good  Plan      Co-evaluation    PT/OT/SLP Co-Evaluation/Treatment: Yes     OT goals addressed during session: ADL's and self-care;Other (comment)(functional mobility)      AM-PAC OT "6 Clicks" Daily Activity     Outcome Measure   Help from another person eating meals?: None Help from another person taking care of personal grooming?: A Little Help from another person toileting, which includes using toliet, bedpan, or urinal?: A Little Help from another person bathing (including washing, rinsing, drying)?: A Little Help from another person to put on and taking off regular upper body clothing?: A Little Help from another person to put on and taking off regular lower body clothing?: A Little 6 Click Score: 19    End of Session Equipment Utilized During Treatment: Gait belt;Rolling walker  OT Visit Diagnosis: Unsteadiness on feet (R26.81);Other abnormalities of gait and mobility (R26.89);Muscle weakness (generalized) (M62.81);Other symptoms and signs involving cognitive function   Activity Tolerance Patient tolerated treatment well   Patient Left Other (comment)(passed off to PT for PT session)   Nurse Communication          Time: 0321-2248 OT Time Calculation (min): 35 min  Charges: OT General Charges $OT Visit: 1 Visit OT Treatments $Self Care/Home Management : 23-37 mins     Quay Burow, OTR/L 10/27/2018, 12:46 PM

## 2018-10-27 NOTE — Progress Notes (Signed)
   VASCULAR SURGERY ASSESSMENT & PLAN:   SYMPTOMATIC LEFT CAROTID STENOSIS: The patient is scheduled for a left carotid endarterectomy tomorrow by Dr. Donnetta Hutching.  I have discussed with her the procedure and potential complications and she is agreeable to proceed.  All of her questions were answered.  I have written preop orders.  She is on aspirin and a statin.  Her Plavix is being held.   SUBJECTIVE:   No specific complaints.  PHYSICAL EXAM:   Vitals:   10/26/18 2018 10/27/18 0030 10/27/18 0421 10/27/18 0739  BP: 137/65 (!) 146/57 (!) 134/58 139/63  Pulse: 81 74 81 75  Resp: 18 16 16 20   Temp: 98.8 F (37.1 C) 98.1 F (36.7 C) 98.4 F (36.9 C) 98.7 F (37.1 C)  TempSrc: Oral Oral Oral Oral  SpO2: 95% 94% 94% 93%  Weight:       Continues to have significant dysarthria.   Currently no significant right upper extremity weakness.  LABS:   Lab Results  Component Value Date   WBC 10.2 10/25/2018   HGB 13.9 10/25/2018   HCT 41.4 10/25/2018   MCV 88.8 10/25/2018   PLT 296 10/25/2018   Lab Results  Component Value Date   CREATININE 0.75 10/25/2018   Lab Results  Component Value Date   INR 1.0 10/25/2018    PROBLEM LIST:    Principal Problem:   Stenosis of internal carotid artery with cerebral infarction, left (HCC) Active Problems:   Alcohol abuse   Seizure disorder (HCC)   COPD (chronic obstructive pulmonary disease) (HCC)   Syncope   Acute ischemic left MCA stroke (HCC)   Cognitive impairment   CURRENT MEDS:   . aspirin EC  81 mg Oral Daily  . atorvastatin  80 mg Oral q1800  . donepezil  5 mg Oral QHS  . enoxaparin (LOVENOX) injection  40 mg Subcutaneous Q24H  . levETIRAcetam  750 mg Oral BID  . memantine  10 mg Oral Daily  . multivitamin with minerals  1 tablet Oral Daily  . sodium chloride flush  3 mL Intravenous Q12H    Deitra Mayo Beeper: [phone] Office: [phone] 10/27/2018

## 2018-10-27 NOTE — Progress Notes (Signed)
PROGRESS NOTE    Kristy Cabrera  YHC:[id number] DOB: 04/30/1951 DOA: 10/24/2018 PCP: [name], NP   Brief Narrative:  HPI On 10/24/2018 by Kristy Cabrera is a 68 y.o. female with medical history significant of hypertension, seizure disorder, COPD, syncope, brain meningioma status post resection, alcohol abuse in remission, and syncope who presented to the emergency [facility] on April 3 with complaints of right upper extremity numbness/tingling with slurred speech.  She was on the phone with a friend and noted symptoms at 9 AM in the morning.  She was recently admitted in March to [facility] with altered mental status and generalized weakness she was diagnosed with metabolic encephalopathy.  At that time CT scan of the head was negative she also had an EEG that was unremarkable.  She has no fevers, cough, shortness of breath, chills, chest pain, abdominal pain, or lower extremity edema.  She was admitted to [facility] and underwent MRI which showed a 2 cm acute ischemic nonhemorrhagic infarct of the cortical and deep white matter of the posterior left tentorium semi-ovale with no associated hemorrhage.  She was then diagnosed with a near left MCA thrombotic stroke due to left internal carotid artery stenosis.  Dr. Carlis Abbott from vascular surgery was contacted and accepted the patient for evaluation.  He requested we admit the patient to our service.  Patient transferred here for vascular surgery to left carotid stenosis  Interim history Patient was transferred from [facility] for CVA and the need for vascular surgery intervention.  Surgery on left ICA likely 10/28/2018. Assessment & Plan   Left internal carotid artery stenosis with cerebral infarction -Patient transferred from [facility] for vascular surgery consultation -Vascular surgery consulted and appreciated- planning for surgery on 10/28/2018  Acute ischemic left MCA thrombotic  stroke -Placed on Plavix at outside hospital but on hold given possible surgery -Currently on aspirin, statin -MRI at outside hospital showed 2 cm acute ischemic nonhemorrhagic infarct of the cortical and deep white matter of the posterior left tentorium semiovale with no associated hemorrhage -PT consulted, recommending HH and speech  Seizure disorder -Stable, continue Keppra  COPD -Stable and without exacerbation, continue home medications  Cognitive impairment -Likely combination of alcohol abuse, seizure disorder, recent CVA -Continue Namenda, Aricept -Currently alert and oriented x3  Alcohol abuse -Currently intermission, no signs or symptoms of withdrawal  History of syncope -Stable no further episodes  DVT Prophylaxis  lovenox  Code Status: Full  Family Communication: None at bedside  Disposition Plan: Admitted. Pending surgery on 10/28/2018. Suspect home later this week with home health PT and speech  Consultants Vascular surgery  Procedures  None  Antibiotics   Anti-infectives (From admission, onward)   Start     Dose/Rate Route Frequency Ordered Stop   10/28/18 0600  ceFAZolin (ANCEF) IVPB 1 g/50 mL premix    Note to Pharmacy:  Send with pt to OR   1 g 100 mL/hr over 30 Minutes Intravenous On call 10/27/18 0930 10/29/18 0600      Subjective:   Kristy Cabrera seen and examined today.  Patient with no complaints this.  Denies current chest pain, shortness breath, abdominal pain, nausea or vomiting, diarrhea constipation.  Objective:   Vitals:   10/27/18 0030 10/27/18 0421 10/27/18 0739 10/27/18 1102  BP: (!) 146/57 (!) 134/58 139/63 (!) 161/73  Pulse: 74 81 75 81  Resp: 16 16 20 20   Temp: 98.1 F (36.7 C) 98.4 F (36.9 C) 98.7 F (  37.1 C) (!) 97.4 F (36.3 C)  TempSrc: Oral Oral Oral Oral  SpO2: 94% 94% 93% 96%  Weight:        Intake/Output Summary (Last 24 hours) at 10/27/2018 1104 Last data filed at 10/27/2018 0100 Gross per 24 hour  Intake  240 ml  Output -  Net 240 ml   Filed Weights   10/25/18 0342  Weight: 73.3 kg   Exam  General: Well developed, well nourished, NAD, appears stated age  108: NCAT, mucous membranes moist.   Cardiovascular: S1 S2 auscultated, RRR, no murmur  Respiratory: Clear to auscultation bilaterally   Abdomen: Soft, nontender, nondistended, + bowel sounds  Extremities: warm dry without cyanosis clubbing or edema  Neuro: AAOx3, speech mildly dysarthric   Psych: pleasant, appropriate mood and affect    Data Reviewed: I have personally reviewed following labs and imaging studies  CBC: Recent Labs  Lab 10/24/18 1553 10/25/18 0341  WBC 10.0 10.2  HGB 12.9 13.9  HCT 38.4 41.4  MCV 87.9 88.8  PLT 284 381   Basic Metabolic Panel: Recent Labs  Lab 10/24/18 1553 10/25/18 0341  NA  --  138  K  --  3.8  CL  --  107  CO2  --  22  GLUCOSE  --  86  BUN  --  23  CREATININE 0.65 0.75  CALCIUM  --  9.5   GFR: CrCl cannot be calculated (Unknown ideal weight.). Liver Function Tests: No results for input(s): AST, ALT, ALKPHOS, BILITOT, PROT, ALBUMIN in the last 168 hours. No results for input(s): LIPASE, AMYLASE in the last 168 hours. No results for input(s): AMMONIA in the last 168 hours. Coagulation Profile: Recent Labs  Lab 10/25/18 0341  INR 1.0   Cardiac Enzymes: No results for input(s): CKTOTAL, CKMB, CKMBINDEX, TROPONINI in the last 168 hours. BNP (last 3 results) No results for input(s): PROBNP in the last 8760 hours. HbA1C: No results for input(s): HGBA1C in the last 72 hours. CBG: No results for input(s): GLUCAP in the last 168 hours. Lipid Profile: No results for input(s): CHOL, HDL, LDLCALC, TRIG, CHOLHDL, LDLDIRECT in the last 72 hours. Thyroid Function Tests: No results for input(s): TSH, T4TOTAL, FREET4, T3FREE, THYROIDAB in the last 72 hours. Anemia Panel: No results for input(s): VITAMINB12, FOLATE, FERRITIN, TIBC, IRON, RETICCTPCT in the last 72 hours.  Urine analysis:    Component Value Date/Time   COLORURINE YELLOW 10/26/2018 0310   APPEARANCEUR CLEAR 10/26/2018 0310   LABSPEC 1.021 10/26/2018 0310   PHURINE 6.0 10/26/2018 0310   GLUCOSEU NEGATIVE 10/26/2018 0310   HGBUR NEGATIVE 10/26/2018 0310   BILIRUBINUR NEGATIVE 10/26/2018 0310   KETONESUR NEGATIVE 10/26/2018 0310   PROTEINUR NEGATIVE 10/26/2018 0310   NITRITE NEGATIVE 10/26/2018 0310   LEUKOCYTESUR MODERATE (A) 10/26/2018 0310   Sepsis Labs: @LABRCNTIP (procalcitonin:4,lacticidven:4)  ) Recent Results (from the past 240 hour(s))  Culture, Urine     Status: Abnormal   Collection Time: 10/26/18  3:13 AM  Result Value Ref Range Status   Specimen Description URINE, RANDOM  Final   Special Requests NONE  Final   Culture (A)  Final    <10,000 COLONIES/mL INSIGNIFICANT GROWTH Performed at [facility], [address] [place], [place] [address]    Report Status 10/27/2018 FINAL  Final      Radiology Studies: No results found.   Scheduled Meds: . aspirin EC  81 mg Oral Daily  . atorvastatin  80 mg Oral q1800  . donepezil  5 mg Oral QHS  . enoxaparin (LOVENOX) injection  40 mg Subcutaneous Q24H  . levETIRAcetam  750 mg Oral BID  . memantine  10 mg Oral Daily  . multivitamin with minerals  1 tablet Oral Daily  . sodium chloride flush  3 mL Intravenous Q12H   Continuous Infusions: . [START ON 10/28/2018]  ceFAZolin (ANCEF) IV       LOS: 3 days   Time Spent in minutes   30 minutes  Tabius Rood D.O. on 10/27/2018 at 11:04 AM  Between 7am to 7pm - Please see pager noted on [email or web address]  After 7pm go to [email or web address]  And look for the night coverage person covering for me after hours  [facility] Office  [phone]

## 2018-10-27 NOTE — H&P (View-Only) (Signed)
   VASCULAR SURGERY ASSESSMENT & PLAN:   SYMPTOMATIC LEFT CAROTID STENOSIS: The patient is scheduled for a left carotid endarterectomy tomorrow by Dr. Donnetta Hutching.  I have discussed with her the procedure and potential complications and she is agreeable to proceed.  All of her questions were answered.  I have written preop orders.  She is on aspirin and a statin.  Her Plavix is being held.   SUBJECTIVE:   No specific complaints.  PHYSICAL EXAM:   Vitals:   10/26/18 2018 10/27/18 0030 10/27/18 0421 10/27/18 0739  BP: 137/65 (!) 146/57 (!) 134/58 139/63  Pulse: 81 74 81 75  Resp: 18 16 16 20   Temp: 98.8 F (37.1 C) 98.1 F (36.7 C) 98.4 F (36.9 C) 98.7 F (37.1 C)  TempSrc: Oral Oral Oral Oral  SpO2: 95% 94% 94% 93%  Weight:       Continues to have significant dysarthria.   Currently no significant right upper extremity weakness.  LABS:   Lab Results  Component Value Date   WBC 10.2 10/25/2018   HGB 13.9 10/25/2018   HCT 41.4 10/25/2018   MCV 88.8 10/25/2018   PLT 296 10/25/2018   Lab Results  Component Value Date   CREATININE 0.75 10/25/2018   Lab Results  Component Value Date   INR 1.0 10/25/2018    PROBLEM LIST:    Principal Problem:   Stenosis of internal carotid artery with cerebral infarction, left (HCC) Active Problems:   Alcohol abuse   Seizure disorder (HCC)   COPD (chronic obstructive pulmonary disease) (HCC)   Syncope   Acute ischemic left MCA stroke (HCC)   Cognitive impairment   CURRENT MEDS:   . aspirin EC  81 mg Oral Daily  . atorvastatin  80 mg Oral q1800  . donepezil  5 mg Oral QHS  . enoxaparin (LOVENOX) injection  40 mg Subcutaneous Q24H  . levETIRAcetam  750 mg Oral BID  . memantine  10 mg Oral Daily  . multivitamin with minerals  1 tablet Oral Daily  . sodium chloride flush  3 mL Intravenous Q12H    Deitra Mayo Beeper: [phone] Office: [phone] 10/27/2018

## 2018-10-27 NOTE — Progress Notes (Signed)
Physical Therapy Treatment Patient Details Name: Kristy Cabrera MRN: [id number] DOB: 09-05-1950 Today's Date: 10/27/2018    History of Present Illness 68 y.o. female that presents as a transfer from [place] with symptomatic high-grade left ICA stenosis and imaging revealed a 2 cm acute ischemic nonhemorrhagic infarct involving the subcortical and white matter of the posterior left centrum semi-ovale    PT Comments    Pt performed gait training and LE strengthening exercises.  Pt continues to require min guard assistance for functional mobility.  Plan for continued strengthening and progression of function.  Pt can return home w/ HHPT if family can provide assistance for 24 hour/day.  If not SNF placement is appropriate to improve strength and function before returning home.      Follow Up Recommendations  Home health PT;Supervision/Assistance - 24 hour(will need snf if family cannot provide safe and adequate assistance.  )     Equipment Recommendations  None recommended by PT    Recommendations for Other Services       Precautions / Restrictions Precautions Precautions: Fall Restrictions Weight Bearing Restrictions: No    Mobility  Bed Mobility Overal bed mobility: Needs Assistance Bed Mobility: Rolling;Sidelying to Sit Rolling: Supervision Sidelying to sit: Min assist       General bed mobility comments: Pt needs cues for sequencing as well as cues to push with UE's vs pulling on bed rails. Assistance to elevate trunk into.    Transfers Overall transfer level: Needs assistance Equipment used: Rolling walker (2 wheeled) Transfers: Sit to/from Stand Sit to Stand: From elevated surface;Min guard         General transfer comment: Pt needed encouragement and extra time but able to do with min guard  Ambulation/Gait Ambulation/Gait assistance: Min assist Gait Distance (Feet): 50 Feet Assistive device: Rolling walker (2 wheeled) Gait Pattern/deviations: Step-through  pattern;Decreased stride length;Shuffle;Narrow base of support;Drifts right/left Gait velocity: decreased   General Gait Details: patient with noted instability during ambulation attempt and decreased gait speed requiring increased cues for improved cadence, posture and RW safety.     Stairs             Wheelchair Mobility    Modified Rankin (Stroke Patients Only)       Balance Overall balance assessment: Needs assistance Sitting-balance support: No upper extremity supported;Feet supported Sitting balance-Leahy Scale: Good     Standing balance support: No upper extremity supported;Bilateral upper extremity supported;During functional activity Standing balance-Leahy Scale: Fair                              Cognition Arousal/Alertness: Awake/alert Behavior During Therapy: WFL for tasks assessed/performed Overall Cognitive Status: No family/caregiver present to determine baseline cognitive functioning                                 General Comments: Pt presenting with impulsivity, decreased safety awareness, and poor problem solving.       Exercises Total Joint Exercises Towel Squeeze: AROM;Both;10 reps;Seated General Exercises - Lower Extremity Ankle Circles/Pumps: AROM;Both;20 reps;Supine Quad Sets: AROM Long Arc Quad: AROM;Both;10 reps;Seated Hip Flexion/Marching: AROM;Both;10 reps;Seated    General Comments        Pertinent Vitals/Pain Pain Assessment: Faces Faces Pain Scale: Hurts a little bit Pain Location: B knees, generalized pain.  Pain Descriptors / Indicators: Discomfort;Grimacing;Guarding;Moaning    Home Living  Prior Function            PT Goals (current goals can now be found in the care plan section) Acute Rehab PT Goals Patient Stated Goal: "Take a nap" Potential to Achieve Goals: Fair Progress towards PT goals: Progressing toward goals    Frequency    Min 4X/week       PT Plan Current plan remains appropriate    Co-evaluation              AM-PAC PT "6 Clicks" Mobility   Outcome Measure  Help needed turning from your back to your side while in a flat bed without using bedrails?: A Little Help needed moving from lying on your back to sitting on the side of a flat bed without using bedrails?: A Little Help needed moving to and from a bed to a chair (including a wheelchair)?: A Little Help needed standing up from a chair using your arms (e.g., wheelchair or bedside chair)?: A Little Help needed to walk in hospital room?: A Little Help needed climbing 3-5 steps with a railing? : A Little 6 Click Score: 18    End of Session Equipment Utilized During Treatment: Gait belt Activity Tolerance: Patient tolerated treatment well Patient left: with call bell/phone within reach;in bed;with bed alarm set Nurse Communication: Mobility status PT Visit Diagnosis: Other abnormalities of gait and mobility (R26.89);Other symptoms and signs involving the nervous system (R29.898)     Time: 8341-9622 PT Time Calculation (min) (ACUTE ONLY): 28 min  Charges:  $Gait Training: 8-22 mins $Therapeutic Exercise: 8-22 mins                     Governor Rooks, PTA Acute Rehabilitation Services Pager [phone] Office [phone]     Kristy Cabrera Eli Hose 10/27/2018, 4:54 PM

## 2018-10-28 ENCOUNTER — Encounter (HOSPITAL_COMMUNITY)
Admission: AD | Disposition: A | Discharge status: Home Health | Payer: Self-pay | Source: Other Acute Inpatient Hospital | Attending: Internal Medicine

## 2018-10-28 ENCOUNTER — Inpatient Hospital Stay (HOSPITAL_COMMUNITY): Payer: PPO | Admitting: Anesthesiology

## 2018-10-28 DIAGNOSIS — I6522 Occlusion and stenosis of left carotid artery: Secondary | ICD-10-CM

## 2018-10-28 HISTORY — PX: ENDARTERECTOMY: SHX5162

## 2018-10-28 LAB — CBC
HCT: 37.7 % (ref 36.0–46.0)
Hemoglobin: 12.1 g/dL (ref 12.0–15.0)
MCH: 28.9 pg (ref 26.0–34.0)
MCHC: 32.1 g/dL (ref 30.0–36.0)
MCV: 90 fL (ref 80.0–100.0)
Platelets: 266 10*3/uL (ref 150–400)
RBC: 4.19 MIL/uL (ref 3.87–5.11)
RDW: 12 % (ref 11.5–15.5)
WBC: 10.7 10*3/uL — ABNORMAL HIGH (ref 4.0–10.5)
nRBC: 0 % (ref 0.0–0.2)

## 2018-10-28 LAB — BASIC METABOLIC PANEL
Anion gap: 5 (ref 5–15)
BUN: 17 mg/dL (ref 8–23)
CO2: 22 mmol/L (ref 22–32)
Calcium: 9 mg/dL (ref 8.9–10.3)
Chloride: 111 mmol/L (ref 98–111)
Creatinine, Ser: 0.64 mg/dL (ref 0.44–1.00)
GFR calc Af Amer: >60 mL/min (ref >60)
GFR calc non Af Amer: >60 mL/min (ref >60)
Glucose, Bld: 84 mg/dL (ref 70–99)
Potassium: 3.8 mmol/L (ref 3.5–5.1)
Sodium: 138 mmol/L (ref 135–145)

## 2018-10-28 LAB — SURGICAL PCR SCREEN
MRSA, PCR: NEGATIVE
Staphylococcus aureus: NEGATIVE

## 2018-10-28 SURGERY — ENDARTERECTOMY, CAROTID
Anesthesia: General | Site: Neck | Laterality: Left

## 2018-10-28 MED ORDER — GUAIFENESIN-DM 100-10 MG/5ML PO SYRP: 15.0000 mL | ORAL_SOLUTION | ORAL | Status: DC | PRN

## 2018-10-28 MED ORDER — FENTANYL CITRATE (PF) 100 MCG/2ML IJ SOLN: 25.0000 ug | INTRAMUSCULAR | Status: DC | PRN

## 2018-10-28 MED ORDER — CEFAZOLIN SODIUM-DEXTROSE 2-4 GM/100ML-% IV SOLN
2.0000 g | Freq: Three times a day (TID) | INTRAVENOUS | Status: AC
  Administered 2018-10-28 – 2018-10-29 (×2): 2 g via INTRAVENOUS
  Filled 2018-10-28 (×2): qty 100

## 2018-10-28 MED ORDER — CEFAZOLIN SODIUM-DEXTROSE 2-3 GM-%(50ML) IV SOLR
INTRAVENOUS | Status: DC | PRN
  Administered 2018-10-28: 2 g via INTRAVENOUS

## 2018-10-28 MED ORDER — FENTANYL CITRATE (PF) 100 MCG/2ML IJ SOLN
INTRAMUSCULAR | Status: DC | PRN
  Administered 2018-10-28 (×2): 25 ug via INTRAVENOUS
  Administered 2018-10-28: 100 ug via INTRAVENOUS
  Administered 2018-10-28: 50 ug via INTRAVENOUS
  Administered 2018-10-28 (×2): 25 ug via INTRAVENOUS

## 2018-10-28 MED ORDER — OXYCODONE-ACETAMINOPHEN 5-325 MG PO TABS: 1.0000 | ORAL_TABLET | ORAL | Status: DC | PRN

## 2018-10-28 MED ORDER — SUCCINYLCHOLINE CHLORIDE 20 MG/ML IJ SOLN
INTRAMUSCULAR | Status: DC | PRN
  Administered 2018-10-28: 100 mg via INTRAVENOUS

## 2018-10-28 MED ORDER — METOPROLOL TARTRATE 5 MG/5ML IV SOLN: 2.0000 mg | INTRAVENOUS | Status: DC | PRN

## 2018-10-28 MED ORDER — ONDANSETRON HCL 4 MG/2ML IJ SOLN: 4.0000 mg | Freq: Four times a day (QID) | INTRAMUSCULAR | Status: DC | PRN

## 2018-10-28 MED ORDER — SODIUM CHLORIDE 0.9 % IV SOLN
INTRAVENOUS | Status: DC | PRN
  Administered 2018-10-28: 10:00:00

## 2018-10-28 MED ORDER — SODIUM CHLORIDE 0.9 % IV SOLN
INTRAVENOUS | Status: DC | PRN
  Administered 2018-10-28: 11:00:00 50 ug/min via INTRAVENOUS

## 2018-10-28 MED ORDER — PROPOFOL 10 MG/ML IV BOLUS
INTRAVENOUS | Status: DC | PRN
  Administered 2018-10-28: 120 mg via INTRAVENOUS

## 2018-10-28 MED ORDER — SUGAMMADEX SODIUM 200 MG/2ML IV SOLN
INTRAVENOUS | Status: DC | PRN
  Administered 2018-10-28: 150 mg via INTRAVENOUS

## 2018-10-28 MED ORDER — LABETALOL HCL 5 MG/ML IV SOLN
INTRAVENOUS | Status: DC | PRN
  Administered 2018-10-28: 5 mg via INTRAVENOUS

## 2018-10-28 MED ORDER — SODIUM CHLORIDE 0.9 % IV SOLN: 500.0000 mL | Freq: Once | INTRAVENOUS | Status: DC | PRN

## 2018-10-28 MED ORDER — HEPARIN SODIUM (PORCINE) 1000 UNIT/ML IJ SOLN
INTRAMUSCULAR | Status: DC | PRN
  Administered 2018-10-28: 7000 [IU] via INTRAVENOUS

## 2018-10-28 MED ORDER — LACTATED RINGERS IV SOLN
INTRAVENOUS | Status: DC | PRN
  Administered 2018-10-28 (×2): via INTRAVENOUS

## 2018-10-28 MED ORDER — PROPOFOL 10 MG/ML IV BOLUS
INTRAVENOUS | Status: AC
  Filled 2018-10-28: qty 20

## 2018-10-28 MED ORDER — SODIUM CHLORIDE 0.9 % IV SOLN
INTRAVENOUS | Status: AC
  Filled 2018-10-28: qty 1.2

## 2018-10-28 MED ORDER — SODIUM CHLORIDE 0.9 % IV SOLN
INTRAVENOUS | Status: DC
  Administered 2018-10-28: 15:00:00 via INTRAVENOUS

## 2018-10-28 MED ORDER — MORPHINE SULFATE (PF) 2 MG/ML IV SOLN: 2.0000 mg | INTRAVENOUS | Status: AC | PRN

## 2018-10-28 MED ORDER — LIDOCAINE HCL (PF) 1 % IJ SOLN
INTRAMUSCULAR | Status: AC
  Filled 2018-10-28: qty 5

## 2018-10-28 MED ORDER — CEFAZOLIN SODIUM-DEXTROSE 2-4 GM/100ML-% IV SOLN
2.0000 g | Freq: Three times a day (TID) | INTRAVENOUS | Status: DC
  Filled 2018-10-28 (×2): qty 100

## 2018-10-28 MED ORDER — DEXAMETHASONE SODIUM PHOSPHATE 10 MG/ML IJ SOLN
INTRAMUSCULAR | Status: DC | PRN
  Administered 2018-10-28: 5 mg via INTRAVENOUS

## 2018-10-28 MED ORDER — OXYCODONE HCL 5 MG PO TABS: 5.0000 mg | ORAL_TABLET | Freq: Once | ORAL | Status: DC | PRN

## 2018-10-28 MED ORDER — 0.9 % SODIUM CHLORIDE (POUR BTL) OPTIME
TOPICAL | Status: DC | PRN
  Administered 2018-10-28: 10:00:00 2000 mL

## 2018-10-28 MED ORDER — PANTOPRAZOLE SODIUM 40 MG PO TBEC
40.0000 mg | DELAYED_RELEASE_TABLET | Freq: Every day | ORAL | Status: DC
  Administered 2018-10-28 – 2018-10-29 (×2): 40 mg via ORAL
  Filled 2018-10-28 (×2): qty 1

## 2018-10-28 MED ORDER — ROCURONIUM BROMIDE 50 MG/5ML IV SOSY
PREFILLED_SYRINGE | INTRAVENOUS | Status: DC | PRN
  Administered 2018-10-28: 35 mg via INTRAVENOUS

## 2018-10-28 MED ORDER — LABETALOL HCL 5 MG/ML IV SOLN: 10.0000 mg | INTRAVENOUS | Status: DC | PRN

## 2018-10-28 MED ORDER — PHENOL 1.4 % MT LIQD: 1.0000 | OROMUCOSAL | Status: DC | PRN

## 2018-10-28 MED ORDER — ONDANSETRON HCL 4 MG/2ML IJ SOLN
INTRAMUSCULAR | Status: DC | PRN
  Administered 2018-10-28: 4 mg via INTRAVENOUS

## 2018-10-28 MED ORDER — PROTAMINE SULFATE 10 MG/ML IV SOLN
INTRAVENOUS | Status: DC | PRN
  Administered 2018-10-28: 15 mg via INTRAVENOUS
  Administered 2018-10-28: 20 mg via INTRAVENOUS
  Administered 2018-10-28: 15 mg via INTRAVENOUS

## 2018-10-28 MED ORDER — FENTANYL CITRATE (PF) 250 MCG/5ML IJ SOLN
INTRAMUSCULAR | Status: AC
  Filled 2018-10-28: qty 5

## 2018-10-28 MED ORDER — HYDRALAZINE HCL 20 MG/ML IJ SOLN: 5.0000 mg | INTRAMUSCULAR | Status: DC | PRN

## 2018-10-28 MED ORDER — OXYCODONE HCL 5 MG/5ML PO SOLN: 5.0000 mg | Freq: Once | ORAL | Status: DC | PRN

## 2018-10-28 MED ORDER — LACTATED RINGERS IV SOLN
INTRAVENOUS | Status: DC
  Administered 2018-10-28: 09:00:00 via INTRAVENOUS

## 2018-10-28 MED ORDER — LIDOCAINE 2% (20 MG/ML) 5 ML SYRINGE
INTRAMUSCULAR | Status: DC | PRN
  Administered 2018-10-28: 60 mg via INTRAVENOUS

## 2018-10-28 SURGICAL SUPPLY — 43 items
CANISTER SUCT 3000ML PPV (MISCELLANEOUS) ×3 IMPLANT
CANNULA VESSEL 3MM 2 BLNT TIP (CANNULA) ×6 IMPLANT
CATH ROBINSON RED A/P 18FR (CATHETERS) ×3 IMPLANT
CLIP LIGATING EXTRA MED SLVR (CLIP) ×3 IMPLANT
CLIP LIGATING EXTRA SM BLUE (MISCELLANEOUS) ×3 IMPLANT
COVER WAND RF STERILE (DRAPES) ×3 IMPLANT
CRADLE DONUT ADULT HEAD (MISCELLANEOUS) ×3 IMPLANT
DECANTER SPIKE VIAL GLASS SM (MISCELLANEOUS) IMPLANT
DERMABOND ADVANCED (GAUZE/BANDAGES/DRESSINGS) ×2
DERMABOND ADVANCED .7 DNX12 (GAUZE/BANDAGES/DRESSINGS) ×1 IMPLANT
DRAIN HEMOVAC 1/8 X 5 (WOUND CARE) IMPLANT
ELECT REM PT RETURN 9FT ADLT (ELECTROSURGICAL) ×3
ELECTRODE REM PT RTRN 9FT ADLT (ELECTROSURGICAL) ×1 IMPLANT
EVACUATOR SILICONE 100CC (DRAIN) IMPLANT
GLOVE BIO SURGEON STRL SZ 6.5 (GLOVE) ×2 IMPLANT
GLOVE BIO SURGEON STRL SZ7.5 (GLOVE) ×2 IMPLANT
GLOVE BIO SURGEONS STRL SZ 6.5 (GLOVE) ×2
GLOVE BIOGEL PI IND STRL 6.5 (GLOVE) IMPLANT
GLOVE BIOGEL PI INDICATOR 6.5 (GLOVE) ×4
GLOVE SS BIOGEL STRL SZ 7.5 (GLOVE) ×1 IMPLANT
GLOVE SUPERSENSE BIOGEL SZ 7.5 (GLOVE) ×2
GOWN STRL REUS W/ TWL LRG LVL3 (GOWN DISPOSABLE) ×3 IMPLANT
GOWN STRL REUS W/TWL LRG LVL3 (GOWN DISPOSABLE) ×6
KIT BASIN OR (CUSTOM PROCEDURE TRAY) ×3 IMPLANT
KIT SHUNT ARGYLE CAROTID ART 6 (VASCULAR PRODUCTS) ×2 IMPLANT
KIT TURNOVER KIT B (KITS) ×3 IMPLANT
NEEDLE 22X1 1/2 (OR ONLY) (NEEDLE) IMPLANT
NS IRRIG 1000ML POUR BTL (IV SOLUTION) ×6 IMPLANT
PACK CAROTID (CUSTOM PROCEDURE TRAY) ×3 IMPLANT
PAD ARMBOARD 7.5X6 YLW CONV (MISCELLANEOUS) ×6 IMPLANT
PATCH HEMASHIELD 8X75 (Vascular Products) ×2 IMPLANT
SHUNT CAROTID BYPASS 10 (VASCULAR PRODUCTS) IMPLANT
SHUNT CAROTID BYPASS 12FRX15.5 (VASCULAR PRODUCTS) IMPLANT
SUT ETHILON 3 0 PS 1 (SUTURE) IMPLANT
SUT PROLENE 6 0 CC (SUTURE) ×7 IMPLANT
SUT SILK 3 0 (SUTURE)
SUT SILK 3-0 18XBRD TIE 12 (SUTURE) IMPLANT
SUT VIC AB 3-0 SH 27 (SUTURE) ×4
SUT VIC AB 3-0 SH 27X BRD (SUTURE) ×2 IMPLANT
SUT VICRYL 4-0 PS2 18IN ABS (SUTURE) ×3 IMPLANT
SYR CONTROL 10ML LL (SYRINGE) IMPLANT
TOWEL GREEN STERILE (TOWEL DISPOSABLE) ×3 IMPLANT
WATER STERILE IRR 1000ML POUR (IV SOLUTION) ×3 IMPLANT

## 2018-10-28 NOTE — Progress Notes (Signed)
  Speech Language Pathology Treatment: Cognitive-Linquistic(Dysarthria)  Patient Details Name: Kristy Cabrera MRN: [id number] DOB: 06/01/1951 Today's Date: 10/28/2018 Time: 9038-3338 SLP Time Calculation (min) (ACUTE ONLY): 16 min  Assessment / Plan / Recommendation Clinical Impression  Pt reported that she was tired but was amenable to participation in an abbreviated session. She completed lingual resistance exercises with moderate cues. She was educated regarding compensatory strategies for speech intelligibility and was able to recall them with moderate cues. She demonstrated 70% accuracy with use of these strategies at the word level increasing to 90% accuracy with mod cues for overarticulation. At the short phrase level she demonstrated 50% accuracy increasing to 80% accuracy with mod-max cues for rate and overarticulation. SLP will continue to follow pt.    HPI HPI: 68 y.o. female with medical history significant of hypertension, seizure disorder, COPD, syncope, brain meningioma status post resection, alcohol abuse in remission, and syncope who presented to the emergency [facility] on April 3 with complaints of right upper extremity numbness/tingling with slurred speech. Pt was found to have symptomatic high-grade left ICA stenosis and imaging revealed a 2 cm acute ischemic nonhemorrhagic infarct involving the subcortical and white matter of the posterior left centrum semi-ovale.        SLP Plan  Continue with current plan of care       Recommendations                   Oral Care Recommendations: Oral care BID Follow up Recommendations: Home health SLP SLP Visit Diagnosis: Dysarthria and anarthria (R47.1) Plan: Continue with current plan of care       Kristy Cabrera I. Hardin Negus, [place], [facility] Office number [phone] Pager [place] 10/28/2018, 5:15 PM

## 2018-10-28 NOTE — Progress Notes (Signed)
Pt belongings, including two silver rings delivered to pt room from [facility]W, given to nursing staff in room. Kristy Cabrera

## 2018-10-28 NOTE — Progress Notes (Signed)
PT Cancellation Note  Patient Details Name: Enma Maeda MRN: [id number] DOB: 1951-02-28   Cancelled Treatment:    Reason Eval/Treat Not Completed: (P) Medical issues which prohibited therapy(Pt off unit for surgical procedure, L endarterectomy.  Will f/u per POC.Marland Kitchen)   Cristela Blue 10/28/2018, 1:46 PM  Governor Rooks, PTA Acute Rehabilitation Services Pager [phone] Office [phone]

## 2018-10-28 NOTE — Op Note (Signed)
° °  OPERATIVE REPORT  DATE OF SURGERY: 10/28/2018  PATIENT: Kristy Cabrera, 68 y.o. female MRN: [id number]  DOB: June 05, 1951  PRE-OPERATIVE DIAGNOSIS: Left Carotid Stenosis, Symptomatic  POST-OPERATIVE DIAGNOSIS:  Same  PROCEDURE:  Left Carotid Endarterectomy with Dacron Patch Angioplasty  SURGEON:  Curt Jews, M.D.  PHYSICIAN ASSISTANT: Matt Eveland, PA-C  ANESTHESIA:   general  EBL: Less than 200 ml  Total I/O In: 1000 [I.V.:1000] Out: 500 [Urine:200; Blood:300]  BLOOD ADMINISTERED: none  DRAINS: none   SPECIMEN: none  COUNTS CORRECT:  YES  PLAN OF CARE: Admit to inpatient   PATIENT DISPOSITION:  PACU - hemodynamically stable and neurologically intact.  PROCEDURE DETAILS: The patient was taken to the operating room placed in supine position.  General anesthesia was administered.  The neck was prepped and draped in the usual sterile fashion.  An incision was made anterior to the sternocleidomastoid and carried down through the platysma with electrocautery.  The sternocleidomastoid was reflected posteriorly and the carotid sheath was opened.  The facial vein was ligated with 2-0 silk ties and divided.  The common carotid artery was encircled with an umbilical tape and Rummel tourniquet.  The vagus nerve was identified and preserved.  Dissection was continued onto the carotid bifurcation.  The superior thyroid artery was encircled with a 2-0 silk Potts tie.  The external carotid was encircled with a blue vessel loop and the internal carotid was encircled with an umbilical tape and Rummel tourniquet.  The hypoglossal nerve was identified and preserved.  The patient was given systemic heparin and after adequate circulation time, the internal, external and common carotid arteries were occluded with vascular clamps.  The common carotid artery was opened with an 11 blade and extended  longitudinally with Potts scissors.  A 10 shunt was passed up the internal carotid and allowed to  backbleed.  It was then passed down the common carotid where it was secured with Rummel tourniquet.  The endarterectomy was begun on the common carotid artery and the plaque was divided proximally with Potts scissors.  The endarterectomy was continued onto the bifurcation.  The external carotid was endarterectomized with an eversion technique and the internal carotid was endarterectomized in an open fashion.  Remaining atheromatous debris was removed from the endarterectomy plane.  A Finesse Hemashield Dacron patch was brought onto the field and was sewn as a patch angioplasty with a running 6-0 Prolene suture.  Prior to completion of the closure the shunt was removed and the usual flushing maneuvers were undertaken.  The anastomosis was completed and flow was restored first to the external and then the internal carotid artery.  Excellent flow characteristics were noted with hand-held Doppler in the internal and external carotid arteries.  The patient was given 50 mg of protamine to reverse the heparin.  The wounds were irrigated with saline.  Hemostasis was obtained with electrocautery.  The wounds were closed with 3-0 Vicryl to reapproximate the sternocleidomastoid over the carotid sheath.  The platysma was lysed with a running 3-0 Vicryl suture.  The skin was closed with a 4-0 subcuticular Vicryl stitch.  Dermabond was applied.  The patient was awakened neurologically intact in the operating room and transferred to the recovery room in stable condition   Curt Jews, M.D. 10/28/2018 1:03 PM

## 2018-10-28 NOTE — Anesthesia Preprocedure Evaluation (Signed)
Anesthesia Evaluation  Patient identified by MRN, date of birth, ID band Patient awake    Reviewed: Allergy & Precautions, H&P , NPO status , Patient's Chart, lab work & pertinent test results  Airway Mallampati: II   Neck ROM: full    Dental   Pulmonary COPD, former smoker,    breath sounds clear to auscultation       Cardiovascular hypertension, + Peripheral Vascular Disease   Rhythm:regular Rate:Normal     Neuro/Psych Seizures -,  CVA    GI/Hepatic (+)     substance abuse  alcohol use,   Endo/Other    Renal/GU      Musculoskeletal   Abdominal   Peds  Hematology   Anesthesia Other Findings   Reproductive/Obstetrics                             Anesthesia Physical Anesthesia Plan  ASA: III  Anesthesia Plan: General   Post-op Pain Management:    Induction: Intravenous  PONV Risk Score and Plan: 3 and Ondansetron, Dexamethasone and Treatment may vary due to age or medical condition  Airway Management Planned: Oral ETT  Additional Equipment: Arterial line  Intra-op Plan:   Post-operative Plan: Extubation in OR  Informed Consent: I have reviewed the patients History and Physical, chart, labs and discussed the procedure including the risks, benefits and alternatives for the proposed anesthesia with the patient or authorized representative who has indicated his/her understanding and acceptance.       Plan Discussed with: CRNA, Anesthesiologist and Surgeon  Anesthesia Plan Comments:         Anesthesia Quick Evaluation

## 2018-10-28 NOTE — Anesthesia Procedure Notes (Signed)
Procedure Name: Intubation Date/Time: 10/28/2018 10:36 AM Performed by: Neldon Newport, CRNA Pre-anesthesia Checklist: Timeout performed, Patient being monitored, Suction available, Emergency Drugs available and Patient identified Patient Re-evaluated:Patient Re-evaluated prior to induction Oxygen Delivery Method: Circle system utilized Preoxygenation: Pre-oxygenation with 100% oxygen Induction Type: IV induction and Rapid sequence Laryngoscope Size: Mac and 3 Grade View: Grade I Tube type: Oral Tube size: 7.0 mm Number of attempts: 1 Placement Confirmation: breath sounds checked- equal and bilateral,  positive ETCO2 and ETT inserted through vocal cords under direct vision Secured at: 22 cm Tube secured with: Tape Dental Injury: Teeth and Oropharynx as per pre-operative assessment

## 2018-10-28 NOTE — Interval H&P Note (Signed)
History and Physical Interval Note:  10/28/2018 9:44 AM  Cecil Cranker  has presented today for surgery, with the diagnosis of left carotid stenosis.  The various methods of treatment have been discussed with the patient and family. After consideration of risks, benefits and other options for treatment, the patient has consented to  Procedure(s): ENDARTERECTOMY CAROTID (Left) as a surgical intervention.  The patient's history has been reviewed, patient examined, no change in status, stable for surgery.  I have reviewed the patient's chart and labs.  Questions were answered to the patient's satisfaction.     Curt Jews

## 2018-10-28 NOTE — Transfer of Care (Signed)
Immediate Anesthesia Transfer of Care Note  Patient: Kristy Cabrera  Procedure(s) Performed: Left Carotid ENDARTERECTOMY (Left Neck)  Patient Location: PACU  Anesthesia Type:General  Level of Consciousness: awake, alert  and oriented  Airway & Oxygen Therapy: Patient Spontanous Breathing and Patient connected to face mask oxygen  Post-op Assessment: Report given to RN, Post -op Vital signs reviewed and stable and Patient moving all extremities X 4  Post vital signs: Reviewed and stable  Last Vitals:  Vitals Value Taken Time  BP 135/54 10/28/2018  1:02 PM  Temp    Pulse 65 10/28/2018  1:04 PM  Resp 20 10/28/2018  1:04 PM  SpO2 100 % 10/28/2018  1:04 PM  Vitals shown include unvalidated device data.  Last Pain:  Vitals:   10/28/18 0849  TempSrc: Oral  PainSc:          Complications: No apparent anesthesia complications

## 2018-10-28 NOTE — Progress Notes (Signed)
PROGRESS NOTE    Kristy Cabrera  YNW:[id number] DOB: [birth year] DOA: 10/24/2018 PCP: [name], NP   Brief Narrative: Patient is a 68 year old female with history of hypertension, seizure disorder, COPD, syncope, brain meningioma status post resection who presented initially to the emergency department at [facility] with complaints of right upper extremity numbness/tingling with slurred speech.  MRI showed 2 cm acute ischemic nonhemorrhagic infarct of the cortical and deep white matter.  Also found to have left internal carotid artery stenosis.She was consulted to tele neurologist at [facility] was eventually   Transferred to [facility] hospital for vascular surgery evaluation.  Undergoing left carotid endarterectomy today by vascular surgery.  Assessment & Plan:   Principal Problem:   Stenosis of internal carotid artery with cerebral infarction, left [facility]) Active Problems:   Alcohol abuse   Seizure disorder (HCC)   COPD (chronic obstructive pulmonary disease) (HCC)   Syncope   Acute ischemic left MCA stroke (HCC)   Cognitive impairment   Acute ischemic left MCA thrombotic stroke: Was on aspirin and Plavix.  Plavix held for carotid endarterectomy.  Currently on aspirin and statin.MRI at [place] showed 2 cm acute ischemic nonhemorrhagic infarct of the cortical and deep white matter of the posterior left tentorium semiovale with no associated hemorrhage. Continue aspirin and Plavix on discharge.  PT consulted and recommended home health on discharge.  Continue statin. She was evaluated by a telemetry neurologist at [facility] who recommended carotid  intervention.  Not seen by a neurologist t here.  No history of TIA or strokes in the past.  Left internal carotid stenosis with cerebral infarction: Undergoing left carotid ENDARTERECTOMY today.  Resume Plavix after surgery.  Vascular surgery following.  Seizure disorder: Continue Keppra.  Currently stable   COPD: Currently stable.  Continue her home medications  Cognitive impairment: Suspected to be multifactorial secondary to alcohol abuse, seizure disorder aggravated by recent CVA.  Continue Namenda and Aricept.  Currently baseline mental status  History of chronic alcohol abuse: Currently in remission        DVT prophylaxis: Lovenox Code Status: Full Family Communication: None present at the bedside Disposition Plan: Home after clearance from vascular surgery   Consultants: Vascular surgery, to neurology  Procedures: Left Carotid endarterectomy  Antimicrobials:  Anti-infectives (From admission, onward)   Start     Dose/Rate Route Frequency Ordered Stop   10/28/18 0600  [MAR Hold]  ceFAZolin (ANCEF) IVPB 1 g/50 mL premix     (MAR Hold since Wed 10/28/2018 at 0859. Reason: Transfer to a Procedural area.)  Note to Pharmacy:  Send with pt to OR   1 g 100 mL/hr over 30 Minutes Intravenous On call 10/27/18 0930 10/29/18 0600      Subjective: Patient seen and examined at the bedside after she came from PACU.  Hemodynamically stable.   Complains of pain on the operated site.  Denies any other complaints.  Objective: Vitals:   10/27/18 2327 10/28/18 0416 10/28/18 0759 10/28/18 0849  BP: (!) 172/63 (!) 173/80 (!) 155/69   Pulse: 78 66 64   Resp: 18 18 20    Temp: 98.2 F (36.8 C) 97.9 F (36.6 C) 97.9 F (36.6 C) 98.2 F (36.8 C)  TempSrc: Oral Oral Oral Oral  SpO2: 95% 95% 93%   Weight:        Intake/Output Summary (Last 24 hours) at 10/28/2018 1228 Last data filed at 10/28/2018 1213 Gross per 24 hour  Intake 1240 ml  Output 1000 ml  Net 240 ml   Filed Weights   10/25/18 0342  Weight: 73.3 kg    Examination:  General exam: Appears calm and comfortable ,Not in distress,average built HEENT:PERRL,Oral mucosa moist, Ear/Nose normal on gross exam, Fresh surgical wound on the left neck Respiratory system: Bilateral equal air entry, normal vesicular breath sounds, no  wheezes or crackles  Cardiovascular system: S1 & S2 heard, RRR. No JVD, murmurs, rubs, gallops or clicks. No pedal edema. Gastrointestinal system: Abdomen is nondistended, soft and nontender. No organomegaly or masses felt. Normal bowel sounds heard. Central nervous system: Alert and oriented. No focal neurological deficits. Extremities: No edema, no clubbing ,no cyanosis, distal peripheral pulses palpable. Skin: No rashes, lesions or ulcers,no icterus ,no pallor MSK: Normal muscle bulk,tone ,power Psychiatry: Judgement and insight appear normal. Mood & affect appropriate.     Data Reviewed: I have personally reviewed following labs and imaging studies  CBC: Recent Labs  Lab 10/24/18 1553 10/25/18 0341 10/28/18 0332  WBC 10.0 10.2 10.7*  HGB 12.9 13.9 12.1  HCT 38.4 41.4 37.7  MCV 87.9 88.8 90.0  PLT 284 296 378   Basic Metabolic Panel: Recent Labs  Lab 10/24/18 1553 10/25/18 0341 10/28/18 0332  NA  --  138 138  K  --  3.8 3.8  CL  --  107 111  CO2  --  22 22  GLUCOSE  --  86 84  BUN  --  23 17  CREATININE 0.65 0.75 0.64  CALCIUM  --  9.5 9.0   GFR: CrCl cannot be calculated (Unknown ideal weight.). Liver Function Tests: No results for input(s): AST, ALT, ALKPHOS, BILITOT, PROT, ALBUMIN in the last 168 hours. No results for input(s): LIPASE, AMYLASE in the last 168 hours. No results for input(s): AMMONIA in the last 168 hours. Coagulation Profile: Recent Labs  Lab 10/25/18 0341  INR 1.0   Cardiac Enzymes: No results for input(s): CKTOTAL, CKMB, CKMBINDEX, TROPONINI in the last 168 hours. BNP (last 3 results) No results for input(s): PROBNP in the last 8760 hours. HbA1C: No results for input(s): HGBA1C in the last 72 hours. CBG: No results for input(s): GLUCAP in the last 168 hours. Lipid Profile: No results for input(s): CHOL, HDL, LDLCALC, TRIG, CHOLHDL, LDLDIRECT in the last 72 hours. Thyroid Function Tests: No results for input(s): TSH, T4TOTAL,  FREET4, T3FREE, THYROIDAB in the last 72 hours. Anemia Panel: No results for input(s): VITAMINB12, FOLATE, FERRITIN, TIBC, IRON, RETICCTPCT in the last 72 hours. Sepsis Labs: No results for input(s): PROCALCITON, LATICACIDVEN in the last 168 hours.  Recent Results (from the past 240 hour(s))  Culture, Urine     Status: Abnormal   Collection Time: 10/26/18  3:13 AM  Result Value Ref Range Status   Specimen Description URINE, RANDOM  Final   Special Requests NONE  Final   Culture (A)  Final    <10,000 COLONIES/mL INSIGNIFICANT GROWTH Performed at [facility], [address] [place], [place] [address]    Report Status 10/27/2018 FINAL  Final  Surgical pcr screen     Status: None   Collection Time: 10/28/18  6:09 AM  Result Value Ref Range Status   MRSA, PCR NEGATIVE NEGATIVE Final   Staphylococcus aureus NEGATIVE NEGATIVE Final    Comment: (NOTE) The Xpert SA Assay (FDA approved for NASAL specimens in patients 61 years of age and older), is one component of a comprehensive surveillance program. It is not intended to diagnose infection nor to guide or monitor  treatment. Performed at [facility], [place] [address], [place], [place] [address]          Radiology Studies: No results found.      Scheduled Meds: . [MAR Hold] aspirin EC  81 mg Oral Daily  . [MAR Hold] atorvastatin  80 mg Oral q1800  . [MAR Hold] donepezil  5 mg Oral QHS  . [MAR Hold] enoxaparin (LOVENOX) injection  40 mg Subcutaneous Q24H  . [MAR Hold] levETIRAcetam  750 mg Oral BID  . [MAR Hold] memantine  10 mg Oral Daily  . [MAR Hold] multivitamin with minerals  1 tablet Oral Daily  . [MAR Hold] sodium chloride flush  3 mL Intravenous Q12H   Continuous Infusions: . [MAR Hold]  ceFAZolin (ANCEF) IV    . lactated ringers 50 mL/hr at 10/28/18 0918     LOS: 4 days    Time spent: 35 mins.More than 50% of that time was spent in counseling and/or coordination of care.      [name], MD [facility] Pager [phone]  If 7PM-7AM, please contact night-coverage [email or web address] Password [facility] 10/28/2018, 12:28 PM

## 2018-10-28 NOTE — Progress Notes (Signed)
Patient arrived from PACU, patient with slurred speech and asymmetrical on the right. Patient with Left neck incision with slight oozing to lower aspect of incision. No swelling noted at this time. Patient placed on monitor and vital signs obtained will monitor patient. Starlene Consuegra, Bettina Gavia rN

## 2018-10-29 LAB — BASIC METABOLIC PANEL
Anion gap: 8 (ref 5–15)
BUN: 21 mg/dL (ref 8–23)
CO2: 21 mmol/L — ABNORMAL LOW (ref 22–32)
Calcium: 8.4 mg/dL — ABNORMAL LOW (ref 8.9–10.3)
Chloride: 107 mmol/L (ref 98–111)
Creatinine, Ser: 1.06 mg/dL — ABNORMAL HIGH (ref 0.44–1.00)
GFR calc Af Amer: >60 mL/min (ref >60)
GFR calc non Af Amer: 54 mL/min — ABNORMAL LOW (ref >60)
Glucose, Bld: 145 mg/dL — ABNORMAL HIGH (ref 70–99)
Potassium: 4.3 mmol/L (ref 3.5–5.1)
Sodium: 136 mmol/L (ref 135–145)

## 2018-10-29 LAB — CBC
HCT: 29.3 % — ABNORMAL LOW (ref 36.0–46.0)
Hemoglobin: 9.6 g/dL — ABNORMAL LOW (ref 12.0–15.0)
MCH: 29.4 pg (ref 26.0–34.0)
MCHC: 32.8 g/dL (ref 30.0–36.0)
MCV: 89.9 fL (ref 80.0–100.0)
Platelets: 236 10*3/uL (ref 150–400)
RBC: 3.26 MIL/uL — ABNORMAL LOW (ref 3.87–5.11)
RDW: 11.9 % (ref 11.5–15.5)
WBC: 15.9 10*3/uL — ABNORMAL HIGH (ref 4.0–10.5)
nRBC: 0 % (ref 0.0–0.2)

## 2018-10-29 MED ORDER — CLOPIDOGREL BISULFATE 75 MG PO TABS: 75.0000 mg | ORAL_TABLET | Freq: Every day | ORAL | 0 refills | Status: AC

## 2018-10-29 MED ORDER — ATORVASTATIN CALCIUM 80 MG PO TABS: 80.0000 mg | ORAL_TABLET | Freq: Every day | ORAL | 0 refills | Status: DC

## 2018-10-29 NOTE — Anesthesia Postprocedure Evaluation (Signed)
Anesthesia Post Note  Patient: Kristy Cabrera  Procedure(s) Performed: Left Carotid ENDARTERECTOMY (Left Neck)     Patient location during evaluation: PACU Anesthesia Type: General Level of consciousness: awake and alert Pain management: pain level controlled Vital Signs Assessment: post-procedure vital signs reviewed and stable Respiratory status: spontaneous breathing, nonlabored ventilation, respiratory function stable and patient connected to nasal cannula oxygen Cardiovascular status: blood pressure returned to baseline and stable Postop Assessment: no apparent nausea or vomiting Anesthetic complications: no    Last Vitals:  Vitals:   10/29/18 0350 10/29/18 1536  BP: (!) 118/56 (!) 163/70  Pulse:  71  Resp: 14 18  Temp: 36.6 C 36.8 C  SpO2: 97% 98%    Last Pain:  Vitals:   10/29/18 1536  TempSrc: Oral  PainSc:                  [place] [address]

## 2018-10-29 NOTE — Care Management Important Message (Signed)
Important Message  Patient Details  Name: Kristy Cabrera MRN: [id number] Date of Birth: 07-Mar-1951   Medicare Important Message Given:  Yes    Pearlie Nies Montine Circle 10/29/2018, 3:50 PM

## 2018-10-29 NOTE — Discharge Instructions (Signed)
° °  [facility] ° °Discharge Instructions °  °Carotid Endarterectomy (CEA) ° °Please refer to the following instructions for your post-procedure care. Your surgeon or physician assistant will discuss any changes with you. ° °Activity ° °You are encouraged to walk as much as you can. You can slowly return to normal activities but must avoid strenuous activity and heavy lifting until your doctor tell you it's okay. Avoid activities such as vacuuming or swinging a golf club. You can drive after one week if you are comfortable and you are no longer taking prescription pain medications. It is normal to feel tired for serval weeks after your surgery. It is also normal to have difficulty with sleep habits, eating, and bowel movements after surgery. These will go away with time. ° °Bathing/Showering ° °Shower daily after you go home. Do not soak in a bathtub, hot tub, or swim until the incision heals completely. ° °Incision Care ° °Shower every day. Clean your incision with mild soap and water. Pat the area dry with a clean towel. You do not need a bandage unless otherwise instructed. Do not apply any ointments or creams to your incision. You may have skin glue on your incision. Do not peel it off. It will come off on its own in about one week. Your incision may feel thickened and raised for several weeks after your surgery. This is normal and the skin will soften over time.  ° °For Men Only: It's okay to shave around the incision but do not shave the incision itself for 2 weeks. It is common to have numbness under your chin that could last for several months. ° °Diet ° °Resume your normal diet. There are no special food restrictions following this procedure. A low fat/low cholesterol diet is recommended for all patients with vascular disease. In order to heal from your surgery, it is CRITICAL to get adequate nutrition. Your body requires vitamins, minerals, and protein. Vegetables are the  best source of vitamins and minerals. Vegetables also provide the perfect balance of protein. Processed food has little nutritional value, so try to avoid this. ° °Medications ° °Resume taking all of your medications unless your doctor or physician assistant tells you not to. If your incision is causing pain, you may take over-the- counter pain relievers such as acetaminophen (Tylenol). If you were prescribed a stronger pain medication, please be aware these medications can cause nausea and constipation. Prevent nausea by taking the medication with a snack or meal. Avoid constipation by drinking plenty of fluids and eating foods with a high amount of fiber, such as fruits, vegetables, and grains.  °Do not take Tylenol if you are taking prescription pain medications. ° °Follow Up ° °Our office will schedule a follow up appointment 2-3 weeks following discharge. ° °Please call us immediately for any of the following conditions ° °Increased pain, redness, drainage (pus) from your incision site. °Fever of 101 degrees or higher. °If you should develop stroke (slurred speech, difficulty swallowing, weakness on one side of your body, loss of vision) you should call 911 and go to the nearest emergency room. ° °Reduce your risk of vascular disease: ° °Stop smoking. If you would like help call [facility] at 1-800-QUIT-NOW (1-800-784-8669) or [place] at 336-586-4000. °Manage your cholesterol °Maintain a desired weight °Control your diabetes °Keep your blood pressure down ° °If you have any questions, please call the office at [phone]. ° °

## 2018-10-29 NOTE — TOC Transition Note (Signed)
Transition of Care [facility]) - CM/SW Discharge Note Marvetta Gibbons RN, BSN Transitions of Care Unit 4E- RN Case Manager [phone]  Patient Details  Name: Deardra Hinkley MRN: [id number] Date of Birth: 12-04-1950  Transition of Care [facility]) CM/SW Contact:  Dawayne Patricia, RN Phone Number: [phone] 10/29/2018, 10:37 AM   Clinical Narrative:    Pt s/p CEA on 10/28/18, stable for transition home today, resumption orders for [facility] have been placed for PT/OT/ST. Call made to [facility], spoke with Tillie Rung- they will resume services on transition home- orders along with d/c summary have been faxed to [facility] via epic to [phone].    Final next level of care: [place] Barriers to Discharge: No Barriers Identified   Patient Goals and CMS Choice Patient states their goals for this hospitalization and ongoing recovery are:: "continue getting stronger" CMS Medicare.gov Compare Post Acute Care list provided to:: Patient Choice offered to / list presented to : Patient, Spouse  Discharge Placement  Home with [facility] services.                      Discharge Plan and Services   Discharge Planning Services: CM Consult Post Acute Care Choice: Home Health              HH Arranged: PT, OT, Speech Therapy [place]: [facility]   Social Determinants of Health (SDOH) Interventions     Readmission Risk Interventions Readmission Risk Prevention Plan 10/29/2018  Post Dischage Appt Complete  Medication Screening Complete  Transportation Screening Complete

## 2018-10-29 NOTE — Progress Notes (Signed)
Occupational Therapy Treatment Patient Details Name: Kristy Cabrera MRN: [id number] DOB: [birth year] Today's Date: 10/29/2018    History of present illness 68 y.o. female that presents as a transfer from [place] with symptomatic high-grade left ICA stenosis and imaging revealed a 2 cm acute ischemic nonhemorrhagic infarct involving the subcortical and white matter of the posterior left centrum semi-ovale   OT comments  Pt appears to be progressing with increased ability to problem solve and prevent falls or LOB with fair balance in standing and when maneuvering in room. Pt performing light ADL in standing with set-upA with minguardA for stability. Pt increasing ability to perform ADL and mobility. Pt reported dizziness and general comments BP was elevated for session. RN aware. Pt would greatly benefit from continued OT skilled services for ADL, mobility and safety in [facility] setting. OT following acutely.   Follow Up Recommendations  Home health OT;Supervision/Assistance - 24 hour    Equipment Recommendations  None recommended by OT    Recommendations for Other Services      Precautions / Restrictions Precautions Precautions: Fall Restrictions Weight Bearing Restrictions: No       Mobility Bed Mobility Overal bed mobility: Needs Assistance Bed Mobility: Rolling;Sidelying to Sit Rolling: Supervision Sidelying to sit: Min assist       General bed mobility comments: Pt needs cues for sequencing as well as cues to push with UE's vs pulling on bed rails. Assistance to elevate trunk into.    Transfers Overall transfer level: Needs assistance Equipment used: Rolling walker (2 wheeled) Transfers: Sit to/from Stand Sit to Stand: From elevated surface;Min guard         General transfer comment: Cues for hand placement    Balance Overall balance assessment: Needs assistance Sitting-balance support: No upper extremity supported;Feet supported Sitting balance-Leahy Scale: Good        Standing balance-Leahy Scale: Fair                             ADL either performed or assessed with clinical judgement   ADL Overall ADL's : Needs assistance/impaired     Grooming: Set up;Sitting   Upper Body Bathing: Supervision/ safety;Set up;Sitting                           Functional mobility during ADLs: Min guard;Rolling walker General ADL Comments: Close supervision for ADL in standing as pt leans posteriorly      Vision   Vision Assessment?: No apparent visual deficits   Perception     Praxis      Cognition Arousal/Alertness: Awake/alert Behavior During Therapy: WFL for tasks assessed/performed Overall Cognitive Status: No family/caregiver present to determine baseline cognitive functioning                                 General Comments: Pt with increased problem solving as pt had to maneuver around obstacles with RW without assist         Exercises     Shoulder Instructions       General Comments Pt HOH. BP taken in standing with activity 127/99 and sitting after activity x3 mins after 117/66. pt reported some dizziness in sitting and standing    Pertinent Vitals/ Pain       Pain Assessment: No/denies pain Faces Pain Scale: No hurt  Home Living  Prior Functioning/Environment              Frequency  Min 2X/week        Progress Toward Goals  OT Goals(current goals can now be found in the care plan section)  Progress towards OT goals: Progressing toward goals  Acute Rehab OT Goals Patient Stated Goal: "Take a nap" OT Goal Formulation: With patient Time For Goal Achievement: 11/08/18 Potential to Achieve Goals: Good ADL Goals Pt Will Perform Grooming: with supervision;with set-up;standing Pt Will Perform Lower Body Dressing: with set-up;with supervision;sit to/from stand Pt Will Transfer to Toilet: with set-up;with  supervision;ambulating;bedside commode Pt Will Perform Toileting - Clothing Manipulation and hygiene: with set-up;with supervision;sit to/from stand Additional ADL Goal #1: Pt will sustain attention to ADL with 2-3 cues  Plan Discharge plan remains appropriate    Co-evaluation                 AM-PAC OT "6 Clicks" Daily Activity     Outcome Measure   Help from another person eating meals?: None Help from another person taking care of personal grooming?: A Little Help from another person toileting, which includes using toliet, bedpan, or urinal?: A Little Help from another person bathing (including washing, rinsing, drying)?: A Little Help from another person to put on and taking off regular upper body clothing?: None Help from another person to put on and taking off regular lower body clothing?: A Little 6 Click Score: 20    End of Session Equipment Utilized During Treatment: Gait belt;Rolling walker  OT Visit Diagnosis: Unsteadiness on feet (R26.81);Other abnormalities of gait and mobility (R26.89);Muscle weakness (generalized) (M62.81);Other symptoms and signs involving cognitive function   Activity Tolerance Patient tolerated treatment well   Patient Left in chair;with call bell/phone within reach;with chair alarm set   Nurse Communication Mobility status        Time: 8325-4982 OT Time Calculation (min): 30 min  Charges: OT General Charges $OT Visit: 1 Visit OT Treatments $Self Care/Home Management : 8-22 mins $Neuromuscular Re-education: 8-22 mins  Darryl Nestle) Marsa Aris OTR/L Acute Rehabilitation Services Pager: [phone] Office: [phone]    Audie Pinto 10/29/2018, 2:51 PM

## 2018-10-29 NOTE — Progress Notes (Signed)
Physical Therapy Treatment Patient Details Name: Kristy Cabrera MRN: [id number] DOB: 08-02-1950 Today's Date: 10/29/2018    History of Present Illness 68 y.o. female that presents as a transfer from [place] with symptomatic high-grade left ICA stenosis and imaging revealed a 2 cm acute ischemic nonhemorrhagic infarct involving the subcortical and white matter of the posterior left centrum semi-ovale    PT Comments    Pt performed gait training minimal distance before returning to bed.  HR elevated to 124 bpm.  Pt slow and guarded and appear pain at surgical site.  Informed nursing of oozing adjacent to surgical site.      Follow Up Recommendations  Home health PT;Supervision/Assistance - 24 hour     Equipment Recommendations  None recommended by PT    Recommendations for Other Services       Precautions / Restrictions Precautions Precautions: Fall Restrictions Weight Bearing Restrictions: No    Mobility  Bed Mobility Overal bed mobility: Needs Assistance Bed Mobility: Rolling;Sidelying to Sit Rolling: Supervision Sidelying to sit: Min assist       General bed mobility comments: Pt needs cues for sequencing as well as cues to push with UE's vs pulling on bed rails. Assistance to elevate trunk into.    Transfers Overall transfer level: Needs assistance Equipment used: Rolling walker (2 wheeled) Transfers: Sit to/from Stand Sit to Stand: From elevated surface;Min assist         General transfer comment: Cues for hand placement  Ambulation/Gait Ambulation/Gait assistance: Mod assist Gait Distance (Feet): 30 Feet Assistive device: Rolling walker (2 wheeled) Gait Pattern/deviations: Step-through pattern;Decreased stride length;Shuffle;Narrow base of support;Drifts right/left     General Gait Details: patient with noted instability during ambulation attempt and decreased gait speed requiring increased cues for improved cadence, posture and RW safety.      Stairs             Wheelchair Mobility    Modified Rankin (Stroke Patients Only)       Balance Overall balance assessment: Needs assistance Sitting-balance support: No upper extremity supported;Feet supported Sitting balance-Leahy Scale: Good       Standing balance-Leahy Scale: Poor                              Cognition Arousal/Alertness: Awake/alert Behavior During Therapy: WFL for tasks assessed/performed Overall Cognitive Status: No family/caregiver present to determine baseline cognitive functioning                                 General Comments: Pt with increased problem solving as pt had to maneuver around obstacles with RW without assist       Exercises      General Comments General comments (skin integrity, edema, etc.): Pt HOH. BP taken in standing with activity 127/99 and sitting after activity x3 mins after 117/66. pt reported some dizziness in sitting and standing      Pertinent Vitals/Pain Pain Assessment: Faces Faces Pain Scale: Hurts little more Pain Location: neck pain Pain Descriptors / Indicators: Aching Pain Intervention(s): Monitored during session;Repositioned    Home Living                      Prior Function            PT Goals (current goals can now be found in the care plan section) Acute Rehab PT Goals Patient  Stated Goal: "Take a nap" Potential to Achieve Goals: Fair Progress towards PT goals: Progressing toward goals    Frequency    Min 4X/week      PT Plan Current plan remains appropriate    Co-evaluation              AM-PAC PT "6 Clicks" Mobility   Outcome Measure  Help needed turning from your back to your side while in a flat bed without using bedrails?: A Little Help needed moving from lying on your back to sitting on the side of a flat bed without using bedrails?: A Little Help needed moving to and from a bed to a chair (including a wheelchair)?: A  Little Help needed standing up from a chair using your arms (e.g., wheelchair or bedside chair)?: A Little Help needed to walk in hospital room?: A Lot Help needed climbing 3-5 steps with a railing? : A Lot 6 Click Score: 16    End of Session Equipment Utilized During Treatment: Gait belt Activity Tolerance: Patient tolerated treatment well Patient left: with call bell/phone within reach;in bed;with bed alarm set Nurse Communication: Mobility status PT Visit Diagnosis: Other abnormalities of gait and mobility (R26.89);Other symptoms and signs involving the nervous system (K93.818)     Time: 2993-7169 PT Time Calculation (min) (ACUTE ONLY): 12 min  Charges:  $Gait Training: 8-22 mins                     Governor Rooks, PTA Acute Rehabilitation Services Pager [phone] Office [phone]     Hugh Kamara Eli Hose 10/29/2018, 5:00 PM

## 2018-10-29 NOTE — Progress Notes (Signed)
Subjective: Interval History: none.. Reports some neck soreness  Objective: Vital signs in last 24 hours: Temp:  [97 F (36.1 C)-98.2 F (36.8 C)] 97.8 F (36.6 C) (04/09 0350) Pulse Rate:  [57-70] 57 (04/08 2200) Resp:  [14-24] 14 (04/09 0350) BP: (91-155)/(43-90) 118/56 (04/09 0350) SpO2:  [91 %-100 %] 97 % (04/09 0350) Arterial Line BP: (111-126)/(47-51) 126/51 (04/08 1347) Weight:  [73.3 kg-75.8 kg] 75.8 kg (04/09 0351)  Intake/Output from previous day: 04/08 0701 - 04/09 0700 In: 1562 [I.V.:1512; IV Piggyback:50] Out: 500 [Urine:200; Blood:300] Intake/Output this shift: No intake/output data recorded.  Neck doing without hematoma.  Neurologically at baseline with severe aphasia/dysarthria  Lab Results: Recent Labs    10/28/18 0332 10/29/18 0350  WBC 10.7* 15.9*  HGB 12.1 9.6*  HCT 37.7 29.3*  PLT 266 236   BMET Recent Labs    10/28/18 0332 10/29/18 0350  NA 138 136  K 3.8 4.3  CL 111 107  CO2 22 21*  GLUCOSE 84 145*  BUN 17 21  CREATININE 0.64 1.06*  CALCIUM 9.0 8.4*    Studies/Results: No results found. Anti-infectives: Anti-infectives (From admission, onward)   Start     Dose/Rate Route Frequency Ordered Stop   10/28/18 1900  ceFAZolin (ANCEF) IVPB 2g/100 mL premix     2 g 200 mL/hr over 30 Minutes Intravenous Every 8 hours 10/28/18 1523 10/29/18 1359   10/28/18 1430  ceFAZolin (ANCEF) IVPB 2g/100 mL premix  Status:  Discontinued     2 g 200 mL/hr over 30 Minutes Intravenous Every 8 hours 10/28/18 1420 10/28/18 1523   10/28/18 0600  ceFAZolin (ANCEF) IVPB 1 g/50 mL premix    Note to Pharmacy:  Send with pt to OR   1 g 100 mL/hr over 30 Minutes Intravenous On call 10/27/18 0930 10/29/18 0600      Assessment/Plan: s/p Procedure(s): Left Carotid ENDARTERECTOMY (Left) Stable postop day 1.  Plan per primary team and stroke service.  Stable for discharge from vascular standpoint   LOS: 5 days   Abisola Carrero 10/29/2018, 6:34 AM

## 2018-10-29 NOTE — Progress Notes (Signed)
IV's and telemetry discontinued at this time. CCMD notified. Discharge instructions reviewed with patient. All questions answered.

## 2018-10-29 NOTE — Discharge Summary (Signed)
Physician Discharge Summary  Kristy Cabrera WLS:[id number] DOB: 11-26-1950 DOA: 10/24/2018  PCP: [name], NP  Admit date: 10/24/2018 Discharge date: 10/29/2018  Admitted From: Home Disposition:  Home  Discharge Condition:Stable CODE STATUS:FULL Diet recommendation: Heart Healthy   Brief/Interim Summary:  Patient is a 68 year old female with history of hypertension, seizure disorder, COPD, syncope, brain meningioma status post resection who presented initially to the emergency department at [facility] with complaints of right upper extremity numbness/tingling with slurred speech.  MRI showed 2 cm acute ischemic nonhemorrhagic infarct of the cortical and deep white matter.  Also found to have left internal carotid artery stenosis.She was consulted to tele neurologist at [facility] was eventually   Transferred to [facility] hospital for vascular surgery evaluation.  Undergoing left carotid endarterectomy on 10/28/18 by vascular surgery. She is hemodynamically stable for discharge today to home.  She will follow-up with neurology and vascular surgery as an outpatient.  Following problems were addressed during hospitalization:  Acute ischemic left MCA thrombotic stroke:  MRI at [facility] showed 2 cm acute ischemic nonhemorrhagic infarct of the cortical and deep white matter of the posterior left tentorium semiovale with no associated hemorrhage. She was on aspirin at home.Stop aspirin and continue  Plavix on discharge.  PT consulted and recommended home health on discharge. Continue high intensity statin. She was evaluated by a telemetry neurologist at [facility] who recommended carotid  intervention.    Left internal carotid stenosis with cerebral infarction: Underwent left carotid ENDARTERECTOMY .  Start Plavix after surgery. She will follow-up with Vascular surgery as an outpatient.  Seizure disorder: Continue Keppra.  Currently stable  COPD: Currently stable.   Continue her home medications  Cognitive impairment: Suspected to be multifactorial secondary to alcohol abuse, seizure disorder aggravated by recent CVA.  Continue Namenda and Aricept.  Currently on baseline mental status  History of chronic alcohol abuse: Currently in remission    Discharge Diagnoses:  Principal Problem:   Stenosis of internal carotid artery with cerebral infarction, left [place]) Active Problems:   Alcohol abuse   Seizure disorder (HCC)   COPD (chronic obstructive pulmonary disease) (HCC)   Syncope   Acute ischemic left MCA stroke [facility])   Cognitive impairment    Discharge Instructions  Discharge Instructions    Ambulatory referral to Neurology   Complete by:  As directed    An appointment is requested in approximately:4 weeks   Diet - low sodium heart healthy   Complete by:  As directed    Discharge instructions   Complete by:  As directed    1)Follow up with your PCP in a week.  Do a CBC, BMP test during the follow-up. 2)Take prescribed medications as instructed.  Stop aspirin and pravastatin.  We have started new medications. 3)Follow up with neurology as an outpatient in 4 weeks.  Name and number the provider group has been attached. 4)Follow up with vascular surgery as an outpatient.   Increase activity slowly   Complete by:  As directed      Allergies as of 10/29/2018   No Known Allergies     Medication List    STOP taking these medications   aspirin EC 81 MG tablet   pravastatin 20 MG tablet Commonly known as:  PRAVACHOL     TAKE these medications   amLODipine 10 MG tablet Commonly known as:  NORVASC Take 10 mg by mouth daily.   atenolol 100 MG tablet Commonly known as:  TENORMIN Take 100  mg by mouth.   atorvastatin 80 MG tablet Commonly known as:  LIPITOR Take 1 tablet (80 mg total) by mouth daily at 6 PM.   calcium-vitamin D 500-200 MG-UNIT tablet Commonly known as:  OSCAL WITH D Take 1 tablet by mouth 2 (two) times daily.    clopidogrel 75 MG tablet Commonly known as:  Plavix Take 1 tablet (75 mg total) by mouth daily for 30 days.   donepezil 5 MG tablet Commonly known as:  ARICEPT Take 5 mg by mouth at bedtime.   levETIRAcetam 750 MG tablet Commonly known as:  KEPPRA Take 750 mg by mouth 2 (two) times daily.   Melatonin 3 MG Caps Take 3 mg by mouth at bedtime.   memantine 10 MG tablet Commonly known as:  NAMENDA Take 10 mg by mouth daily.   SUPER B COMPLEX PO Take 1 tablet by mouth daily.      Follow-up Information    [name], NP. Schedule an appointment as soon as possible for a visit in 1 week(s).   Specialty:  Nurse Practitioner Contact information: [place] [phone]        [facility]. Schedule an appointment as soon as possible for a visit in 4 week(s).   Specialty:  Neurology Contact information: [address] [place] [phone]         No Known Allergies  Consultations:  Vascular surgery, neurology   Procedures/Studies:  No results found.    Subjective: Patient seen and examined at the bedside this morning.  Remains comfortable.  Hemodynamically stable for discharge today to home.  Updated the discharge plan to the husband.  Discharge Exam: Vitals:   10/29/18 0000 10/29/18 0350  BP: (!) 101/51 (!) 118/56  Pulse:    Resp:  14  Temp:  97.8 F (36.6 C)  SpO2:  97%   Vitals:   10/28/18 2200 10/29/18 0000 10/29/18 0350 10/29/18 0351  BP: 103/90 (!) 101/51 (!) 118/56   Pulse: (!) 57     Resp:   14   Temp:   97.8 F (36.6 C)   TempSrc:   Oral   SpO2: 96%  97%   Weight:    75.8 kg  Height:        General: Pt is alert, awake, not in acute distress Cardiovascular: RRR, S1/S2 +, no rubs, no gallops Respiratory: CTA bilaterally, no wheezing, no rhonchi Abdominal: Soft, NT, ND, bowel sounds + Extremities: no edema, no cyanosis    The  results of significant diagnostics from this hospitalization (including imaging, microbiology, ancillary and laboratory) are listed below for reference.     Microbiology: Recent Results (from the past 240 hour(s))  Culture, Urine     Status: Abnormal   Collection Time: 10/26/18  3:13 AM  Result Value Ref Range Status   Specimen Description URINE, RANDOM  Final   Special Requests NONE  Final   Culture (A)  Final    <10,000 COLONIES/mL INSIGNIFICANT GROWTH Performed at [facility], [address] [place], [place] [address]    Report Status 10/27/2018 FINAL  Final  Surgical pcr screen     Status: None   Collection Time: 10/28/18  6:09 AM  Result Value Ref Range Status   MRSA, PCR NEGATIVE NEGATIVE Final   Staphylococcus aureus NEGATIVE NEGATIVE Final    Comment: (NOTE) The Xpert SA Assay (FDA approved for NASAL specimens in patients 69 years  of age and older), is one component of a comprehensive surveillance program. It is not intended to diagnose infection nor to guide or monitor treatment. Performed at [facility], [place] [address], [place], [place] [address]      Labs: BNP (last 3 results) No results for input(s): BNP in the last 8760 hours. Basic Metabolic Panel: Recent Labs  Lab 10/24/18 1553 10/25/18 0341 10/28/18 0332 10/29/18 0350  NA  --  138 138 136  K  --  3.8 3.8 4.3  CL  --  107 111 107  CO2  --  22 22 21*  GLUCOSE  --  86 84 145*  BUN  --  23 17 21   CREATININE 0.65 0.75 0.64 1.06*  CALCIUM  --  9.5 9.0 8.4*   Liver Function Tests: No results for input(s): AST, ALT, ALKPHOS, BILITOT, PROT, ALBUMIN in the last 168 hours. No results for input(s): LIPASE, AMYLASE in the last 168 hours. No results for input(s): AMMONIA in the last 168 hours. CBC: Recent Labs  Lab 10/24/18 1553 10/25/18 0341 10/28/18 0332 10/29/18 0350  WBC 10.0 10.2 10.7* 15.9*  HGB 12.9 13.9 12.1 9.6*  HCT 38.4 41.4 37.7 29.3*  MCV 87.9 88.8 90.0 89.9  PLT 284 296  266 236   Cardiac Enzymes: No results for input(s): CKTOTAL, CKMB, CKMBINDEX, TROPONINI in the last 168 hours. BNP: Invalid input(s): POCBNP CBG: No results for input(s): GLUCAP in the last 168 hours. D-Dimer No results for input(s): DDIMER in the last 72 hours. Hgb A1c No results for input(s): HGBA1C in the last 72 hours. Lipid Profile No results for input(s): CHOL, HDL, LDLCALC, TRIG, CHOLHDL, LDLDIRECT in the last 72 hours. Thyroid function studies No results for input(s): TSH, T4TOTAL, T3FREE, THYROIDAB in the last 72 hours.  Invalid input(s): FREET3 Anemia work up No results for input(s): VITAMINB12, FOLATE, FERRITIN, TIBC, IRON, RETICCTPCT in the last 72 hours. Urinalysis    Component Value Date/Time   COLORURINE YELLOW 10/26/2018 0310   APPEARANCEUR CLEAR 10/26/2018 0310   LABSPEC 1.021 10/26/2018 0310   PHURINE 6.0 10/26/2018 0310   GLUCOSEU NEGATIVE 10/26/2018 0310   HGBUR NEGATIVE 10/26/2018 0310   BILIRUBINUR NEGATIVE 10/26/2018 0310   KETONESUR NEGATIVE 10/26/2018 0310   PROTEINUR NEGATIVE 10/26/2018 0310   NITRITE NEGATIVE 10/26/2018 0310   LEUKOCYTESUR MODERATE (A) 10/26/2018 0310   Sepsis Labs Invalid input(s): PROCALCITONIN,  WBC,  LACTICIDVEN Microbiology Recent Results (from the past 240 hour(s))  Culture, Urine     Status: Abnormal   Collection Time: 10/26/18  3:13 AM  Result Value Ref Range Status   Specimen Description URINE, RANDOM  Final   Special Requests NONE  Final   Culture (A)  Final    <10,000 COLONIES/mL INSIGNIFICANT GROWTH Performed at [facility], [address] [place], [place] [address]    Report Status 10/27/2018 FINAL  Final  Surgical pcr screen     Status: None   Collection Time: 10/28/18  6:09 AM  Result Value Ref Range Status   MRSA, PCR NEGATIVE NEGATIVE Final   Staphylococcus aureus NEGATIVE NEGATIVE Final    Comment: (NOTE) The Xpert SA Assay (FDA approved for NASAL specimens in patients 28 years of age and  older), is one component of a comprehensive surveillance program. It is not intended to diagnose infection nor to guide or monitor treatment. Performed at [facility], [place] [address], [place], [place] [address]     Please note: You were cared  for by a hospitalist during your hospital stay. Once you are discharged, your primary care physician will handle any further medical issues. Please note that NO REFILLS for any discharge medications will be authorized once you are discharged, as it is imperative that you return to your primary care physician (or establish a relationship with a primary care physician if you do not have one) for your post hospital discharge needs so that they can reassess your need for medications and monitor your lab values.    Time coordinating discharge: 40 minutes  SIGNED:   [name], MD  [facility] 10/29/2018, 9:52 AM Pager [phone]  If 7PM-7AM, please contact night-coverage [email or web address] Password [email or web address]

## 2018-10-29 NOTE — Progress Notes (Signed)
Pt discharged home and realized that she forgot her home meds from [facility]. Lorenza Chick, RN delivered meds to a family friend at main entrance at [facility], per pt's husband's request.

## 2018-10-29 NOTE — Progress Notes (Signed)
  Speech Language Pathology Treatment: Cognitive-Linquistic(Dysarthria)  Patient Details Name: Kristy Cabrera MRN: [id number] DOB: [birth year] Today's Date: 10/29/2018 Time: [id number] SLP Time Calculation (min) (ACUTE ONLY): 13 min  Assessment / Plan / Recommendation Clinical Impression  Pt was cooperative throughout the session. She vaguely recalled participating in the SLP session yesterday and was unable to recall compensatory strategies for speech intelligibility. She was re-educated regarding compensatory strategies for speech intelligibility and she verbalized understanding regarding them. Her overall speech intelligibility was improved compared to that which was noted yesterday. She demonstrated 85% accuracy with use of these strategies at the 8-9 word sentence level increasing to 100% accuracy with min-mod cues for overarticulation and rate. Moderate cues were needed for consistent use of compensatory strategies at the conversational level. Pt currently has discharge orders and will benefit from continued SLP services following discharge.    HPI HPI: 68 y.o. female with medical history significant of hypertension, seizure disorder, COPD, syncope, brain meningioma status post resection, alcohol abuse in remission, and syncope who presented to the emergency [facility] on April 3 with complaints of right upper extremity numbness/tingling with slurred speech. Pt was found to have symptomatic high-grade left ICA stenosis and imaging revealed a 2 cm acute ischemic nonhemorrhagic infarct involving the subcortical and white matter of the posterior left centrum semi-ovale.        SLP Plan  Continue with current plan of care       Recommendations                   Oral Care Recommendations: Oral care BID Follow up Recommendations: Home health SLP SLP Visit Diagnosis: Dysarthria and anarthria (R47.1) Plan: Continue with current plan of care       Vi Biddinger I. Hardin Negus,  [place], [place] Office number [phone] Pager [place] 10/29/2018, 1:02 PM

## 2018-10-30 ENCOUNTER — Encounter (HOSPITAL_COMMUNITY): Payer: Self-pay | Admitting: Vascular Surgery

## 2018-10-30 ENCOUNTER — Emergency Department (HOSPITAL_COMMUNITY): Payer: PPO

## 2018-10-30 ENCOUNTER — Observation Stay (HOSPITAL_COMMUNITY)
Admission: EM | Admit: 2018-10-30 | Discharge: 2018-11-02 | Disposition: A | Discharge status: Home Health | Payer: PPO | Attending: Internal Medicine | Admitting: Internal Medicine

## 2018-10-30 ENCOUNTER — Other Ambulatory Visit: Payer: Self-pay

## 2018-10-30 DIAGNOSIS — E669 Obesity, unspecified: Secondary | ICD-10-CM | POA: Diagnosis not present

## 2018-10-30 DIAGNOSIS — Z6830 Body mass index (BMI) 30.0-30.9, adult: Secondary | ICD-10-CM | POA: Diagnosis not present

## 2018-10-30 DIAGNOSIS — G40909 Epilepsy, unspecified, not intractable, without status epilepticus: Secondary | ICD-10-CM | POA: Diagnosis not present

## 2018-10-30 DIAGNOSIS — J449 Chronic obstructive pulmonary disease, unspecified: Secondary | ICD-10-CM | POA: Insufficient documentation

## 2018-10-30 DIAGNOSIS — R55 Syncope and collapse: Secondary | ICD-10-CM | POA: Diagnosis not present

## 2018-10-30 DIAGNOSIS — I10 Essential (primary) hypertension: Secondary | ICD-10-CM | POA: Insufficient documentation

## 2018-10-30 DIAGNOSIS — Z8673 Personal history of transient ischemic attack (TIA), and cerebral infarction without residual deficits: Secondary | ICD-10-CM | POA: Diagnosis not present

## 2018-10-30 DIAGNOSIS — R4189 Other symptoms and signs involving cognitive functions and awareness: Secondary | ICD-10-CM | POA: Diagnosis present

## 2018-10-30 DIAGNOSIS — Z87891 Personal history of nicotine dependence: Secondary | ICD-10-CM | POA: Insufficient documentation

## 2018-10-30 DIAGNOSIS — Z85820 Personal history of malignant melanoma of skin: Secondary | ICD-10-CM | POA: Insufficient documentation

## 2018-10-30 DIAGNOSIS — Z79899 Other long term (current) drug therapy: Secondary | ICD-10-CM | POA: Insufficient documentation

## 2018-10-30 DIAGNOSIS — E785 Hyperlipidemia, unspecified: Secondary | ICD-10-CM | POA: Insufficient documentation

## 2018-10-30 DIAGNOSIS — M8580 Other specified disorders of bone density and structure, unspecified site: Secondary | ICD-10-CM | POA: Insufficient documentation

## 2018-10-30 DIAGNOSIS — D72829 Elevated white blood cell count, unspecified: Secondary | ICD-10-CM | POA: Insufficient documentation

## 2018-10-30 DIAGNOSIS — Z7902 Long term (current) use of antithrombotics/antiplatelets: Secondary | ICD-10-CM | POA: Insufficient documentation

## 2018-10-30 DIAGNOSIS — D649 Anemia, unspecified: Secondary | ICD-10-CM | POA: Insufficient documentation

## 2018-10-30 DIAGNOSIS — G9389 Other specified disorders of brain: Secondary | ICD-10-CM | POA: Insufficient documentation

## 2018-10-30 DIAGNOSIS — E0590 Thyrotoxicosis, unspecified without thyrotoxic crisis or storm: Secondary | ICD-10-CM | POA: Diagnosis present

## 2018-10-30 DIAGNOSIS — I959 Hypotension, unspecified: Secondary | ICD-10-CM | POA: Diagnosis not present

## 2018-10-30 DIAGNOSIS — R404 Transient alteration of awareness: Secondary | ICD-10-CM | POA: Diagnosis not present

## 2018-10-30 DIAGNOSIS — R4182 Altered mental status, unspecified: Secondary | ICD-10-CM | POA: Diagnosis not present

## 2018-10-30 DIAGNOSIS — F1011 Alcohol abuse, in remission: Secondary | ICD-10-CM | POA: Diagnosis not present

## 2018-10-30 DIAGNOSIS — Z8249 Family history of ischemic heart disease and other diseases of the circulatory system: Secondary | ICD-10-CM | POA: Diagnosis not present

## 2018-10-30 DIAGNOSIS — R531 Weakness: Secondary | ICD-10-CM | POA: Diagnosis not present

## 2018-10-30 DIAGNOSIS — E039 Hypothyroidism, unspecified: Secondary | ICD-10-CM | POA: Diagnosis not present

## 2018-10-30 DIAGNOSIS — R569 Unspecified convulsions: Secondary | ICD-10-CM | POA: Diagnosis not present

## 2018-10-30 DIAGNOSIS — R410 Disorientation, unspecified: Secondary | ICD-10-CM | POA: Diagnosis not present

## 2018-10-30 LAB — CBC WITH DIFFERENTIAL/PLATELET
Abs Immature Granulocytes: 0.12 10*3/uL — ABNORMAL HIGH (ref 0.00–0.07)
Basophils Absolute: 0.1 10*3/uL (ref 0.0–0.1)
Basophils Relative: 0 %
Eosinophils Absolute: 0 10*3/uL (ref 0.0–0.5)
Eosinophils Relative: 0 %
HCT: 35.2 % — ABNORMAL LOW (ref 36.0–46.0)
Hemoglobin: 11.2 g/dL — ABNORMAL LOW (ref 12.0–15.0)
Immature Granulocytes: 1 %
Lymphocytes Relative: 8 %
Lymphs Abs: 1.5 10*3/uL (ref 0.7–4.0)
MCH: 28.9 pg (ref 26.0–34.0)
MCHC: 31.8 g/dL (ref 30.0–36.0)
MCV: 90.7 fL (ref 80.0–100.0)
Monocytes Absolute: 2 10*3/uL — ABNORMAL HIGH (ref 0.1–1.0)
Monocytes Relative: 11 %
Neutro Abs: 14.5 10*3/uL — ABNORMAL HIGH (ref 1.7–7.7)
Neutrophils Relative %: 80 %
Platelets: 287 10*3/uL (ref 150–400)
RBC: 3.88 MIL/uL (ref 3.87–5.11)
RDW: 12.1 % (ref 11.5–15.5)
WBC: 18.3 10*3/uL — ABNORMAL HIGH (ref 4.0–10.5)
nRBC: 0 % (ref 0.0–0.2)

## 2018-10-30 LAB — COMPREHENSIVE METABOLIC PANEL
ALT: 15 U/L (ref 0–44)
AST: 24 U/L (ref 15–41)
Albumin: 3.3 g/dL — ABNORMAL LOW (ref 3.5–5.0)
Alkaline Phosphatase: 127 U/L — ABNORMAL HIGH (ref 38–126)
Anion gap: 10 (ref 5–15)
BUN: 13 mg/dL (ref 8–23)
CO2: 25 mmol/L (ref 22–32)
Calcium: 9.3 mg/dL (ref 8.9–10.3)
Chloride: 102 mmol/L (ref 98–111)
Creatinine, Ser: 0.79 mg/dL (ref 0.44–1.00)
GFR calc Af Amer: >60 mL/min (ref >60)
GFR calc non Af Amer: >60 mL/min (ref >60)
Glucose, Bld: 135 mg/dL — ABNORMAL HIGH (ref 70–99)
Potassium: 3.9 mmol/L (ref 3.5–5.1)
Sodium: 137 mmol/L (ref 135–145)
Total Bilirubin: 0.4 mg/dL (ref 0.3–1.2)
Total Protein: 6.7 g/dL (ref 6.5–8.1)

## 2018-10-30 LAB — URINALYSIS, ROUTINE W REFLEX MICROSCOPIC
Bilirubin Urine: NEGATIVE
Glucose, UA: NEGATIVE mg/dL
Hgb urine dipstick: NEGATIVE
Ketones, ur: NEGATIVE mg/dL
Leukocytes,Ua: NEGATIVE
Nitrite: NEGATIVE
Protein, ur: NEGATIVE mg/dL
Specific Gravity, Urine: 1.014 (ref 1.005–1.030)
pH: 7 (ref 5.0–8.0)

## 2018-10-30 LAB — PROTIME-INR
INR: 1 (ref 0.8–1.2)
Prothrombin Time: 13.5 seconds (ref 11.4–15.2)

## 2018-10-30 LAB — TSH: TSH: 0.175 u[IU]/mL — ABNORMAL LOW (ref 0.350–4.500)

## 2018-10-30 LAB — CBC
HCT: 33.1 % — ABNORMAL LOW (ref 36.0–46.0)
Hemoglobin: 10.9 g/dL — ABNORMAL LOW (ref 12.0–15.0)
MCH: 29.5 pg (ref 26.0–34.0)
MCHC: 32.9 g/dL (ref 30.0–36.0)
MCV: 89.5 fL (ref 80.0–100.0)
Platelets: 315 10*3/uL (ref 150–400)
RBC: 3.7 MIL/uL — ABNORMAL LOW (ref 3.87–5.11)
RDW: 12.1 % (ref 11.5–15.5)
WBC: 17.3 10*3/uL — ABNORMAL HIGH (ref 4.0–10.5)
nRBC: 0 % (ref 0.0–0.2)

## 2018-10-30 LAB — CBG MONITORING, ED: Glucose-Capillary: 120 mg/dL — ABNORMAL HIGH (ref 70–99)

## 2018-10-30 LAB — CREATININE, SERUM
Creatinine, Ser: 0.66 mg/dL (ref 0.44–1.00)
GFR calc Af Amer: >60 mL/min (ref >60)
GFR calc non Af Amer: >60 mL/min (ref >60)

## 2018-10-30 MED ORDER — ONDANSETRON HCL 4 MG PO TABS: 4.0000 mg | ORAL_TABLET | Freq: Four times a day (QID) | ORAL | Status: DC | PRN

## 2018-10-30 MED ORDER — SENNOSIDES-DOCUSATE SODIUM 8.6-50 MG PO TABS: 1.0000 | ORAL_TABLET | Freq: Every evening | ORAL | Status: DC | PRN

## 2018-10-30 MED ORDER — SODIUM CHLORIDE 0.9% FLUSH
3.0000 mL | Freq: Two times a day (BID) | INTRAVENOUS | Status: DC
  Administered 2018-10-31 – 2018-11-02 (×5): 3 mL via INTRAVENOUS

## 2018-10-30 MED ORDER — LEVETIRACETAM 750 MG PO TABS
750.0000 mg | ORAL_TABLET | Freq: Two times a day (BID) | ORAL | Status: DC
  Administered 2018-10-30 – 2018-11-02 (×6): 750 mg via ORAL
  Filled 2018-10-30 (×8): qty 1

## 2018-10-30 MED ORDER — MEMANTINE HCL 10 MG PO TABS
10.0000 mg | ORAL_TABLET | Freq: Every day | ORAL | Status: DC
  Administered 2018-10-31 – 2018-11-02 (×3): 10 mg via ORAL
  Filled 2018-10-30 (×3): qty 1

## 2018-10-30 MED ORDER — CALCIUM CARBONATE-VITAMIN D 500-200 MG-UNIT PO TABS
1.0000 | ORAL_TABLET | Freq: Two times a day (BID) | ORAL | Status: DC
  Administered 2018-10-31 – 2018-11-02 (×5): 1 via ORAL
  Filled 2018-10-30 (×5): qty 1

## 2018-10-30 MED ORDER — ONDANSETRON HCL 4 MG/2ML IJ SOLN: 4.0000 mg | Freq: Four times a day (QID) | INTRAMUSCULAR | Status: DC | PRN

## 2018-10-30 MED ORDER — ATORVASTATIN CALCIUM 80 MG PO TABS
80.0000 mg | ORAL_TABLET | Freq: Every day | ORAL | Status: DC
  Administered 2018-10-31 – 2018-11-01 (×2): 80 mg via ORAL
  Filled 2018-10-30 (×3): qty 1

## 2018-10-30 MED ORDER — ACETAMINOPHEN 650 MG RE SUPP: 650.0000 mg | Freq: Four times a day (QID) | RECTAL | Status: DC | PRN

## 2018-10-30 MED ORDER — SODIUM CHLORIDE 0.9 % IV SOLN
INTRAVENOUS | Status: AC
  Administered 2018-10-30: 20:00:00 via INTRAVENOUS

## 2018-10-30 MED ORDER — ACETAMINOPHEN 325 MG PO TABS
650.0000 mg | ORAL_TABLET | Freq: Four times a day (QID) | ORAL | Status: DC | PRN
  Administered 2018-10-31 – 2018-11-01 (×2): 650 mg via ORAL
  Filled 2018-10-30 (×2): qty 2

## 2018-10-30 MED ORDER — ENOXAPARIN SODIUM 40 MG/0.4ML ~~LOC~~ SOLN
40.0000 mg | SUBCUTANEOUS | Status: DC
  Administered 2018-10-30 – 2018-11-01 (×3): 40 mg via SUBCUTANEOUS
  Filled 2018-10-30 (×3): qty 0.4

## 2018-10-30 MED ORDER — DONEPEZIL HCL 5 MG PO TABS
5.0000 mg | ORAL_TABLET | Freq: Every day | ORAL | Status: DC
  Administered 2018-10-30 – 2018-11-01 (×3): 5 mg via ORAL
  Filled 2018-10-30 (×3): qty 1

## 2018-10-30 MED ORDER — CLOPIDOGREL BISULFATE 75 MG PO TABS
75.0000 mg | ORAL_TABLET | Freq: Every day | ORAL | Status: DC
  Administered 2018-10-31 – 2018-11-02 (×3): 75 mg via ORAL
  Filled 2018-10-30 (×3): qty 1

## 2018-10-30 NOTE — H&P (Signed)
History and Physical    Kristy Cabrera ZCH:[id number] DOB: Apr 26, 1951 DOA: 10/30/2018  PCP: [name], NP  Patient coming from: Home  Chief Complaint: Loss of strength  HPI: Kristy Cabrera is a 68 y.o. female with medical history significant of Dementia, HTN, HLD, seizure disorder, osteopenia, h/o meningioma resected who presented with syncope.  The patient was just discharged from the hospital yesterday after being treated for acute stroke and s/p left endarterectomy on 10/28/18.  She was doing well per her report the evening of discharge and then this morning after breakfast she passed out.  She does not remember much of this, but does not think she was out for very long and does not think she had a seizure.  She did not have any chest pain, palpitations, SOB, cough, abdominal pain, fever, chills, nausea or vomiting.  I spoke with her husband Adithi Gammon who feels that she was very weak on discharge.  He wishes that she was able to go to rehab instead of directly home.  He feels that she was too weak after surgery.  He specifically denies complete LOC, chest pain, fever, chills, cough, other symptoms.  He noted that she "melted" to the ground.    She did take all of her medications this morning which include amlodipine and atenolol, which she reports she has been on for years.  She was mildly bradycardic after surgery based on chart review.   ED Course: In the ED, she has been somnolent.  She was awake to voice and could answer questions. She denied any wounds.  She has some mild pain of the left neck at the site of surgery.  She denied any focal weakness.  Blood work has shown a mildly elevated glucose.  WBC up to 18.3 from 15.9 yesterday.  UA did not show any signs of infection.  She further has a mild stable anemia.    Review of Systems: As per HPI otherwise 10 point review of systems negative.   Past Medical History:  Diagnosis Date  . Brain tumor (benign) ([place])   . Episodic mood disorder  ([place])   . Hyperlipidemia   . Hypertension   . Insomnia   . Osteopenia   . Weakness of both legs     Past Surgical History:  Procedure Laterality Date  . Brain tumor removal    . ENDARTERECTOMY Left 10/28/2018   Procedure: Left Carotid ENDARTERECTOMY;  Surgeon: [name], MD;  Location: [facility];  Service: Vascular;  Laterality: Left;  Marland [place] with patient.   reports that she quit smoking about 4 years ago. Her smoking use included cigarettes. She smoked 3.00 packs per day. She has never used smokeless tobacco. She reports previous alcohol use. She reports that she does not use drugs.  No Known Allergies  Family History  Problem Relation Age of Onset  . Diabetes Mother   . Breast cancer Mother   . Hypertension Mother   . Kidney disease Mother   . Arthritis Mother   . Brain cancer Mother   . Diabetes Sister   . Hypertension Sister   . Diabetes Brother   . Hypertension Brother   . Diabetes Maternal Grandmother   . Hypertension Maternal Grandmother   . Heart disease Maternal Grandmother   . Asthma Maternal Grandfather     Prior to Admission medications   Medication Sig Start Date End Date Taking? Authorizing Provider  amLODipine (NORVASC) 10 MG  tablet Take 10 mg by mouth daily.    [provider]  atenolol (TENORMIN) 100 MG tablet Take 100 mg by mouth.    [provider]  atorvastatin (LIPITOR) 80 MG tablet Take 1 tablet (80 mg total) by mouth daily at 6 PM. 10/29/18   [name], MD  B Complex-C (SUPER B COMPLEX PO) Take 1 tablet by mouth daily.    [provider]  calcium-vitamin D (OSCAL WITH D) 500-200 MG-UNIT tablet Take 1 tablet by mouth 2 (two) times daily.    [provider]  clopidogrel (PLAVIX) 75 MG tablet Take 1 tablet (75 mg total) by mouth daily for 30 days. 10/29/18 11/28/18  [name], MD  donepezil (ARICEPT) 5 MG tablet Take 5 mg by mouth at bedtime.    [provider]   levETIRAcetam (KEPPRA) 750 MG tablet Take 750 mg by mouth 2 (two) times daily.    [provider]  Melatonin 3 MG CAPS Take 3 mg by mouth at bedtime.    [provider]  memantine (NAMENDA) 10 MG tablet Take 10 mg by mouth daily.     [provider]    Physical Exam: Vitals:   10/30/18 1230 10/30/18 1300 10/30/18 1500 10/30/18 1700  BP: (!) 108/49 119/61 (!) 130/57 (!) 115/50  Pulse: (!) 58 62 63 60  Resp: 16 (!) 23 19 20   Temp:      TempSrc:      SpO2: 96% 97% 99%   Weight:      Height:        Constitutional: NAD, calm, sleepy, but awake to voice and answered all questions Eyes: PERRL, lids and conjunctivae normal, no icterus ENMT: Mucous membranes are moist. Normal dentition.  Neck: normal, supple.  She has a recent endarterectomy incision which is clean and dry, no oozing, bleeding or swelling at the site.  Respiratory: CTAB, no wheezing or rales Cardiovascular: RR, NR, no murmur noted, HR in the 60s Abdomen: +BS, NT, ND Musculoskeletal: no clubbing / cyanosis. Normal muscle tone.  Skin: No lesions on abdomen, vulva, buttocks or gluteal cleft.  She does have a mild maculopapular rash on the legs, she reports this is not itchy Neurologic: CN 2-12 grossly intact. Sensation intact, Strength 5/5 in all 4.  Report from ED is that she could not stand on her own.  Psychiatric: Normal judgment and insight. Alert and oriented x 3. Fatigued.     Labs on Admission: I have personally reviewed following labs and imaging studies  CBC: Recent Labs  Lab 10/24/18 1553 10/25/18 0341 10/28/18 0332 10/29/18 0350 10/30/18 1137  WBC 10.0 10.2 10.7* 15.9* 18.3*  NEUTROABS  --   --   --   --  14.5*  HGB 12.9 13.9 12.1 9.6* 11.2*  HCT 38.4 41.4 37.7 29.3* 35.2*  MCV 87.9 88.8 90.0 89.9 90.7  PLT 284 296 266 236 270   Basic Metabolic Panel: Recent Labs  Lab 10/24/18 1553 10/25/18 0341 10/28/18 0332 10/29/18 0350 10/30/18 1137  NA  --  138 138 136 137  K   --  3.8 3.8 4.3 3.9  CL  --  107 111 107 102  CO2  --  22 22 21* 25  GLUCOSE  --  86 84 145* 135*  BUN  --  23 17 21 13   CREATININE 0.65 0.75 0.64 1.06* 0.79  CALCIUM  --  9.5 9.0 8.4* 9.3   GFR: Estimated Creatinine Clearance: 70 mL/min (by C-G formula based  on SCr of 0.79 mg/dL). Liver Function Tests: Recent Labs  Lab 10/30/18 1137  AST 24  ALT 15  ALKPHOS 127*  BILITOT 0.4  PROT 6.7  ALBUMIN 3.3*   No results for input(s): LIPASE, AMYLASE in the last 168 hours. No results for input(s): AMMONIA in the last 168 hours. Coagulation Profile: Recent Labs  Lab 10/25/18 0341 10/30/18 1137  INR 1.0 1.0   Cardiac Enzymes: No results for input(s): CKTOTAL, CKMB, CKMBINDEX, TROPONINI in the last 168 hours. BNP (last 3 results) No results for input(s): PROBNP in the last 8760 hours. HbA1C: No results for input(s): HGBA1C in the last 72 hours. CBG: Recent Labs  Lab 10/30/18 1155  GLUCAP 120*   Lipid Profile: No results for input(s): CHOL, HDL, LDLCALC, TRIG, CHOLHDL, LDLDIRECT in the last 72 hours. Thyroid Function Tests: No results for input(s): TSH, T4TOTAL, FREET4, T3FREE, THYROIDAB in the last 72 hours. Anemia Panel: No results for input(s): VITAMINB12, FOLATE, FERRITIN, TIBC, IRON, RETICCTPCT in the last 72 hours. Urine analysis:    Component Value Date/Time   COLORURINE YELLOW 10/30/2018 1530   APPEARANCEUR HAZY (A) 10/30/2018 1530   LABSPEC 1.014 10/30/2018 1530   PHURINE 7.0 10/30/2018 1530   GLUCOSEU NEGATIVE 10/30/2018 1530   HGBUR NEGATIVE 10/30/2018 1530   BILIRUBINUR NEGATIVE 10/30/2018 [place] 10/30/2018 1530   PROTEINUR NEGATIVE 10/30/2018 1530   NITRITE NEGATIVE 10/30/2018 1530   LEUKOCYTESUR NEGATIVE 10/30/2018 1530    Radiological Exams on Admission: Ct Head Wo Contrast  Result Date: 10/30/2018 CLINICAL DATA:  Altered level of consciousness. EXAM: CT HEAD WITHOUT CONTRAST TECHNIQUE: Contiguous axial images were obtained from  the base of the skull through the vertex without intravenous contrast. COMPARISON:  CT 10/23/2018 FINDINGS: Brain: Stable bifrontal encephalomalacia. Old bilateral lacunar infarcts in the basal ganglia. No acute intracranial abnormality. Specifically, no hemorrhage, hydrocephalus, mass lesion, acute infarction, or significant intracranial injury. Vascular: No hyperdense vessel or unexpected calcification. Skull: No acute calvarial abnormality. Sinuses/Orbits: Visualized paranasal sinuses and mastoids clear. Orbital soft tissues unremarkable. Other: There is gas noted within the soft tissues in the left side of the lower face/neck posterior to the left mandible of unknown etiology. IMPRESSION: Stable bifrontal encephalomalacia. Stable old bilateral basal ganglia lacunar infarcts. No acute intracranial abnormality. Gas within the soft tissues of the lower face posterior to the left mandible of unknown etiology. Electronically Signed   By: Rolm Baptise M.D.   On: 10/30/2018 15:04   Dg Chest Port 1 View  Result Date: 10/30/2018 CLINICAL DATA:  Syncope EXAM: PORTABLE CHEST 1 VIEW COMPARISON:  10/23/2018 FINDINGS: Cardiac enlargement without heart failure. Atherosclerotic aortic arch and right carotid artery. Probable left carotid endarterectomy. Lungs clear without infiltrate effusion or mass. IMPRESSION: No active disease. Electronically Signed   By: Franchot Gallo M.D.   On: 10/30/2018 12:16    EKG: Independently reviewed. SR, rate 65, TWI in anterior leads.   Assessment/Plan Pre- Syncope and collapse - Most likely causes are vasovagal due to recent illness, carotid surgery VS bradycardia.   - consider vascular ultrasound of the neck, however, patient reports no change to symptoms of neck disease.  Will order and can cancel if not felt needed - Telemetry  - I think she may be having bradycardic episodes, hold atenolol - Diastolic pressure is also low, which can cause passing out, hold amlodipine -  Monitor blood pressure closely - TTE ordered - IVF at 75cc/hr for 10 hours - Orthostatic vitals - Monitor for seizures -  reported not to have one for a while - PT/OT evaluation - CT head with stable changes, gas in the left lower face, likely related to recent surgery, however, could be indicative of surgical site infection.  Outward appearance of surgical site looks good, no pus, no swelling, minimal erythema.  Follow up Blood cultures.    Leukocytosis - No reported fevers, UA looked okay - Added on UC (last showed < 100K colonies) and BC X 2 as no clear reason for elevated WBC - Monitor for fever, no antibiotics at this time.  - Trend, possibly related to post op status - CXR without acute finding     Seizure disorder - Continue keppra  HTN - Holding atenolol and amlodipine  HLD - Continue atorvastatin   DVT prophylaxis: Lovenox  Code Status: Full  Family Communication: Husband, Joe Waldren.  He was very upset about what he considered to be an early discharge at last hospitalization.  I referred him to the office of patient satisfaction and they will get in touch with him Monday to discuss.    Disposition Plan: Admit for further workup  Consults called: None  Admission status: Obs, med tele    Gilles Chiquito MD [facility]   If 7PM-7AM, please contact night-coverage [email or web address]   10/30/2018, 6:15 PM

## 2018-10-30 NOTE — Progress Notes (Signed)
Orthostatic vitals complete with the exception of standing at 3 minutes.  Patient became too weak.

## 2018-10-30 NOTE — ED Notes (Signed)
ED TO INPATIENT HANDOFF REPORT  ED Nurse Name and Phone #: Stacie Glaze Name/Age/Gender Kristy Cabrera 68 y.o. female Room/Bed: 025C/025C  Code Status   Code Status: Prior  Home/SNF/Other Home Patient oriented to: self, place and time Is this baseline? Yes   Triage Complete: Triage complete  Chief Complaint Syncope  Triage Note Discharged from this facility yesterday following stroke, surgical intervention done with surgical site at neck unbandaged.  Syncopal episode this AM, per husband pt was confused and disoriented, stood up with walker then collapsed, Denies LOC.  Oriented x4 at baseline  Hx seizures, husband says this does not look like previous seizures   Allergies No Known Allergies  Level of Care/Admitting Diagnosis ED Disposition    ED Disposition Condition Comment   Admit  Hospital Area: [facility] [100100]  Level of Care: Telemetry Medical [104]  Diagnosis: Syncope and collapse [780.2.ICD-9-CM]  Admitting Physician: Sid Falcon [phone]  Attending Physician: Sid Falcon [phone]  PT Class (Do Not Modify): Observation [104]  PT Acc Code (Do Not Modify): Observation [10022]       B Medical/Surgery History Past Medical History:  Diagnosis Date  . Brain tumor (benign) ([place])   . Episodic mood disorder ([place])   . Hyperlipidemia   . Hypertension   . Insomnia   . Osteopenia   . Weakness of both legs    Past Surgical History:  Procedure Laterality Date  . Brain tumor removal    . ENDARTERECTOMY Left 10/28/2018   Procedure: Left Carotid ENDARTERECTOMY;  Surgeon: [name], MD;  Location: [facility];  Service: Vascular;  Laterality: Left;  Marland Kitchen MELANOMA EXCISION  1987   BACK     A IV Location/Drains/Wounds Patient Lines/Drains/Airways Status   Active Line/Drains/Airways    Name:   Placement date:   Placement time:   Site:   Days:   External Urinary Catheter   10/28/18    1441    -   2   Incision (Closed) 10/28/18 Neck Left    10/28/18    1058     2          Intake/Output Last 24 hours No intake or output data in the 24 hours ending 10/30/18 1744  Labs/Imaging Results for orders placed or performed during the hospital encounter of 10/30/18 (from the past 48 hour(s))  CBC WITH DIFFERENTIAL     Status: Abnormal   Collection Time: 10/30/18 11:37 AM  Result Value Ref Range   WBC 18.3 (H) 4.0 - 10.5 K/uL   RBC 3.88 3.87 - 5.11 MIL/uL   Hemoglobin 11.2 (L) 12.0 - 15.0 g/dL   HCT 35.2 (L) 36.0 - 46.0 %   MCV 90.7 80.0 - 100.0 fL   MCH 28.9 26.0 - 34.0 pg   MCHC 31.8 30.0 - 36.0 g/dL   RDW 12.1 11.5 - 15.5 %   Platelets 287 150 - 400 K/uL   nRBC 0.0 0.0 - 0.2 %   Neutrophils Relative % 80 %   Neutro Abs 14.5 (H) 1.7 - 7.7 K/uL   Lymphocytes Relative 8 %   Lymphs Abs 1.5 0.7 - 4.0 K/uL   Monocytes Relative 11 %   Monocytes Absolute 2.0 (H) 0.1 - 1.0 K/uL   Eosinophils Relative 0 %   Eosinophils Absolute 0.0 0.0 - 0.5 K/uL   Basophils Relative 0 %   Basophils Absolute 0.1 0.0 - 0.1 K/uL   Immature Granulocytes 1 %   Abs Immature Granulocytes 0.12 (  H) 0.00 - 0.07 K/uL    Comment: Performed at [facility], [place] [address], [place], [place] [address]  Comprehensive metabolic panel     Status: Abnormal   Collection Time: 10/30/18 11:37 AM  Result Value Ref Range   Sodium 137 135 - 145 mmol/L   Potassium 3.9 3.5 - 5.1 mmol/L   Chloride 102 98 - 111 mmol/L   CO2 25 22 - 32 mmol/L   Glucose, Bld 135 (H) 70 - 99 mg/dL   BUN 13 8 - 23 mg/dL   Creatinine, Ser 0.79 0.44 - 1.00 mg/dL   Calcium 9.3 8.9 - 10.3 mg/dL   Total Protein 6.7 6.5 - 8.1 g/dL   Albumin 3.3 (L) 3.5 - 5.0 g/dL   AST 24 15 - 41 U/L   ALT 15 0 - 44 U/L   Alkaline Phosphatase 127 (H) 38 - 126 U/L   Total Bilirubin 0.4 0.3 - 1.2 mg/dL   GFR calc non Af Amer >60 >60 mL/min   GFR calc Af Amer >60 >60 mL/min   Anion gap 10 5 - 15    Comment: Performed at [facility] [address] [place], [place] [address]  Protime-INR      Status: None   Collection Time: 10/30/18 11:37 AM  Result Value Ref Range   Prothrombin Time 13.5 11.4 - 15.2 seconds   INR 1.0 0.8 - 1.2    Comment: (NOTE) INR goal varies based on device and disease states. Performed at [facility], [place] [address], [place], [place] [address]   CBG monitoring, ED     Status: Abnormal   Collection Time: 10/30/18 11:55 AM  Result Value Ref Range   Glucose-Capillary 120 (H) 70 - 99 mg/dL   Comment 1 Notify RN    Comment 2 Document in Chart   Urinalysis, Routine w reflex microscopic     Status: Abnormal   Collection Time: 10/30/18  3:30 PM  Result Value Ref Range   Color, Urine YELLOW YELLOW   APPearance HAZY (A) CLEAR   Specific Gravity, Urine 1.014 1.005 - 1.030   pH 7.0 5.0 - 8.0   Glucose, UA NEGATIVE NEGATIVE mg/dL   Hgb urine dipstick NEGATIVE NEGATIVE   Bilirubin Urine NEGATIVE NEGATIVE   Ketones, ur NEGATIVE NEGATIVE mg/dL   Protein, ur NEGATIVE NEGATIVE mg/dL   Nitrite NEGATIVE NEGATIVE   Leukocytes,Ua NEGATIVE NEGATIVE    Comment: Performed at [facility] [address], [place],  [address]   Ct Head Wo Contrast  Result Date: 10/30/2018 CLINICAL DATA:  Altered level of consciousness. EXAM: CT HEAD WITHOUT CONTRAST TECHNIQUE: Contiguous axial images were obtained from the base of the skull through the vertex without intravenous contrast. COMPARISON:  CT 10/23/2018 FINDINGS: Brain: Stable bifrontal encephalomalacia. Old bilateral lacunar infarcts in the basal ganglia. No acute intracranial abnormality. Specifically, no hemorrhage, hydrocephalus, mass lesion, acute infarction, or significant intracranial injury. Vascular: No hyperdense vessel or unexpected calcification. Skull: No acute calvarial abnormality. Sinuses/Orbits: Visualized paranasal sinuses and mastoids clear. Orbital soft tissues unremarkable. Other: There is gas noted within the soft tissues in the left side of the lower face/neck posterior to the left  mandible of unknown etiology. IMPRESSION: Stable bifrontal encephalomalacia. Stable old bilateral basal ganglia lacunar infarcts. No acute intracranial abnormality. Gas within the soft tissues of the lower face posterior to the left mandible of unknown etiology. Electronically Signed   By: Rolm Baptise M.D.   On: 10/30/2018  15:04   Dg Chest Port 1 View  Result Date: 10/30/2018 CLINICAL DATA:  Syncope EXAM: PORTABLE CHEST 1 VIEW COMPARISON:  10/23/2018 FINDINGS: Cardiac enlargement without heart failure. Atherosclerotic aortic arch and right carotid artery. Probable left carotid endarterectomy. Lungs clear without infiltrate effusion or mass. IMPRESSION: No active disease. Electronically Signed   By: Franchot Gallo M.D.   On: 10/30/2018 12:16    Pending Labs [employer] (From admission, onward)    Start     Ordered   Signed and Held  CBC  (enoxaparin (LOVENOX)    CrCl >/= 30 ml/min)  Once,   R    Comments:  Baseline for enoxaparin therapy IF NOT ALREADY DRAWN.  Notify MD if PLT < 100 K.    Signed and Held   Signed and Held  Creatinine, serum  (enoxaparin (LOVENOX)    CrCl >/= 30 ml/min)  Once,   R    Comments:  Baseline for enoxaparin therapy IF NOT ALREADY DRAWN.    Signed and Held   Signed and Held  Creatinine, serum  (enoxaparin (LOVENOX)    CrCl >/= 30 ml/min)  Weekly,   R    Comments:  while on enoxaparin therapy    Signed and Held   Signed and Held  Urine culture  Once,   R     Signed and Held   Signed and Held  TSH  Once,   R     Signed and Held          Vitals/Pain Today's Vitals   10/30/18 1230 10/30/18 1300 10/30/18 1500 10/30/18 1700  BP: (!) 108/49 119/61 (!) 130/57 (!) 115/50  Pulse: (!) 58 62 63 60  Resp: 16 (!) 23 19 20   Temp:      TempSrc:      SpO2: 96% 97% 99%   Weight:      Height:        Isolation Precautions No active isolations  Medications Medications - No data to display  Mobility non-ambulatory High fall risk   Focused  Assessments Neuro Assessment Handoff:  Swallow screen pass?          Neuro Assessment: Exceptions to WDL Neuro Checks:      Last Documented NIHSS Modified Score:   Has TPA been given? No If patient is a Neuro Trauma and patient is going to OR before floor call report to [facility] nurse: [phone] or [phone]     R Recommendations: See Admitting Provider Note  Report given to:   Additional Notes:

## 2018-10-30 NOTE — Progress Notes (Addendum)
Pt received from ED to ed17- transferred to bed, nurse says they  Have not walked in ED and  C/o weak in legs and syncopal episode,  bed weight done, pt noted to have some aphasia, follows commands, left neck incision with dermabond, call bell in reach, incs of urine, states she does not always know, [place] in place, bed in low pisition , bed alarm, called spouse to update moved 3e 17, offered to give number to nurse station and he said he was already in bed and no pen and paper, he was fine with nurse calling him if any issues

## 2018-10-30 NOTE — ED Triage Notes (Signed)
Discharged from this facility yesterday following stroke, surgical intervention done with surgical site at neck unbandaged.  Syncopal episode this AM, per husband pt was confused and disoriented, stood up with walker then collapsed, Denies LOC.  Oriented x4 at baseline  Hx seizures, husband says this does not look like previous seizures

## 2018-10-30 NOTE — ED Notes (Signed)
Claretta Fraise (Husband # [phone])-called/would like a call/update.

## 2018-10-30 NOTE — ED Provider Notes (Signed)
[facility] EMERGENCY DEPARTMENT Provider Note   CSN: [id number] Arrival date & time: 10/30/18  1118    History   Chief Complaint No chief complaint on file.   HPI Kristy Cabrera is a 68 y.o. female a past medical history of hypertension, seizure, COPD, syncope, brain meningioma status post resection. There is a level 5 caveat due to confusion.  She presents to the emergency department for weakness and presyncope.  The patient is status post left carotid artery endarterectomy on 10/28/2018 by Dr. Curt Jews.  She was discharged home from the hospital yesterday.  Endarterectomy occurred after the patient presented to [facility] for slurred speech, right upper extremity numbness and tingling.  She was found to have a 2 cm acute ischemic nonhemorrhagic infarct and left carotid artery stenosis.  Patient states that this morning her husband said "let's go watching TV."  She states that as she was getting up and using her walker she got very shaky and weak.  She denies loss of consciousness.  According to the nursing triage notes and per the patient's husband,during this episode the patient became confused and disoriented and then had to sit back down.  He states that it did not look like her previous strokes.   Patient denies weakness, numbness, difficulty swallowing, severe headache.  She has not taken any pain medication today.  She denies much pain.  She denies fevers or chills.      HPI  Past Medical History:  Diagnosis Date  . Brain tumor (benign) ([place])   . Episodic mood disorder ([place])   . Hyperlipidemia   . Hypertension   . Insomnia   . Osteopenia   . Weakness of both legs     Patient Active Problem List   Diagnosis Date Noted  . Syncope and collapse 10/30/2018  . Stenosis of internal carotid artery with cerebral infarction, left ([place]) 10/24/2018  . Seizure disorder ([place]) 10/24/2018  . COPD (chronic obstructive pulmonary disease) ([place]) 10/24/2018  .  Syncope 10/24/2018  . Acute ischemic left MCA stroke ([place]) 10/24/2018  . Cognitive impairment 10/24/2018  . Confusion   . Acute hypokalemia   . Hydrochlorothiazide adverse reaction   . Hyponatremia 07/04/2016  . Acute hyponatremia   . Alcohol abuse   . Alcohol withdrawal syndrome with complication ([place])   . Acute encephalopathy     Past Surgical History:  Procedure Laterality Date  . Brain tumor removal    . ENDARTERECTOMY Left 10/28/2018   Procedure: Left Carotid ENDARTERECTOMY;  Surgeon: [name], MD;  Location: [facility];  Service: Vascular;  Laterality: Left;  Marland Kitchen MELANOMA EXCISION  1987   BACK     OB History   No obstetric history on file.      Home Medications    Prior to Admission medications   Medication Sig Start Date End Date Taking? Authorizing Provider  amLODipine (NORVASC) 10 MG tablet Take 10 mg by mouth daily.   Yes [provider]  atenolol (TENORMIN) 25 MG tablet Take 25 mg by mouth daily.    Yes [provider]  atorvastatin (LIPITOR) 80 MG tablet Take 1 tablet (80 mg total) by mouth daily at 6 PM. 10/29/18  Yes Adhikari, Amrit, MD  B Complex-C (SUPER B COMPLEX PO) Take 1 tablet by mouth daily.   Yes [provider]  calcium-vitamin D (OSCAL WITH D) 500-200 MG-UNIT tablet Take 1 tablet by mouth 2 (two) times daily.   Yes [provider]  clopidogrel (  PLAVIX) 75 MG tablet Take 1 tablet (75 mg total) by mouth daily for 30 days. 10/29/18 11/28/18 Yes Adhikari, Tamsen Meek, MD  donepezil (ARICEPT) 5 MG tablet Take 5 mg by mouth at bedtime.   Yes [provider]  levETIRAcetam (KEPPRA) 750 MG tablet Take 750 mg by mouth 2 (two) times daily.   Yes [provider]  Melatonin 3 MG CAPS Take 3 mg by mouth at bedtime.   Yes [provider]  memantine (NAMENDA) 10 MG tablet Take 10 mg by mouth daily.    Yes [provider]    Family History Family History  Problem Relation Age of Onset  . Diabetes Mother    . Breast cancer Mother   . Hypertension Mother   . Kidney disease Mother   . Arthritis Mother   . Brain cancer Mother   . Diabetes Sister   . Hypertension Sister   . Diabetes Brother   . Hypertension Brother   . Diabetes Maternal Grandmother   . Hypertension Maternal Grandmother   . Heart disease Maternal Grandmother   . Asthma Maternal Grandfather     Social History Social History   Tobacco Use  . Smoking status: Former Smoker    Packs/day: 3.00    Types: Cigarettes    Last attempt to quit: 08/22/2014    Years since quitting: 4.1  . Smokeless tobacco: Never Used  Substance Use Topics  . Alcohol use: Not Currently    Comment: , quit 07/2014  . Drug use: No     Allergies   Patient has no known allergies.   Review of Systems Review of Systems Ten systems reviewed and are negative for acute change, except as noted in the HPI.    Physical Exam Updated Vital Signs BP (!) 104/50   Pulse (!) 57   Temp 98.7 F (37.1 C) (Oral)   Resp 20   Ht 5\' 1"  (1.549 m)   Wt 69.7 kg   SpO2 91%   BMI 29.02 kg/m   Physical Exam Vitals signs and nursing note reviewed.  Constitutional:      General: She is not in acute distress.    Appearance: Normal appearance. She is well-developed. She is not diaphoretic.     Comments: Patient appears generally weak and lethargic  HENT:     Head: Normocephalic and atraumatic.  Eyes:     General: No scleral icterus.    Extraocular Movements: Extraocular movements intact.     Conjunctiva/sclera: Conjunctivae normal.     Pupils: Pupils are equal, round, and reactive to light.  Neck:     Musculoskeletal: Normal range of motion.     Comments: Left neck with fresh surgical incision.  Dermabond covering.  There is some surrounding swelling without signs of infection or erythema, no active bleeding. Cardiovascular:     Rate and Rhythm: Normal rate and regular rhythm.     Heart sounds: Normal heart sounds. No murmur. No friction rub. No gallop.    Pulmonary:     Effort: Pulmonary effort is normal. No respiratory distress.     Breath sounds: Normal breath sounds.  Abdominal:     General: Bowel sounds are normal. There is no distension.     Palpations: Abdomen is soft. There is no mass.     Tenderness: There is no abdominal tenderness. There is no guarding.  Skin:    General: Skin is warm and dry.  Neurological:     Mental Status: She is alert and oriented  to person, place, and time.  Psychiatric:        Behavior: Behavior normal.      ED Treatments / Results  Labs (all labs ordered are listed, but only abnormal results are displayed) Labs Reviewed  CBC WITH DIFFERENTIAL/PLATELET - Abnormal; Notable for the following components:      Result Value   WBC 18.3 (*)    Hemoglobin 11.2 (*)    HCT 35.2 (*)    Neutro Abs 14.5 (*)    Monocytes Absolute 2.0 (*)    Abs Immature Granulocytes 0.12 (*)    All other components within normal limits  COMPREHENSIVE METABOLIC PANEL - Abnormal; Notable for the following components:   Glucose, Bld 135 (*)    Albumin 3.3 (*)    Alkaline Phosphatase 127 (*)    All other components within normal limits  URINALYSIS, ROUTINE W REFLEX MICROSCOPIC - Abnormal; Notable for the following components:   APPearance HAZY (*)    All other components within normal limits  CBC - Abnormal; Notable for the following components:   WBC 17.3 (*)    RBC 3.70 (*)    Hemoglobin 10.9 (*)    HCT 33.1 (*)    All other components within normal limits  TSH - Abnormal; Notable for the following components:   TSH 0.175 (*)    All other components within normal limits  CBG MONITORING, ED - Abnormal; Notable for the following components:   Glucose-Capillary 120 (*)    All other components within normal limits  URINE CULTURE  PROTIME-INR  CREATININE, SERUM  GLUCOSE, CAPILLARY  T4, FREE  T3, FREE    EKG EKG Interpretation  Date/Time:  Friday October 30 2018 11:27:36 EDT Ventricular Rate:  65 PR Interval:     QRS Duration: 95 QT Interval:  426 QTC Calculation: 443 R Axis:   63 Text Interpretation:  Sinus rhythm Nonspecific T abnrm, anterolateral leads similar to previous from 07/05/16 Confirmed by Virgel Manifold [phone]) on 10/30/2018 3:58:46 PM   Radiology Ct Head Wo Contrast  Result Date: 10/30/2018 CLINICAL DATA:  Altered level of consciousness. EXAM: CT HEAD WITHOUT CONTRAST TECHNIQUE: Contiguous axial images were obtained from the base of the skull through the vertex without intravenous contrast. COMPARISON:  CT 10/23/2018 FINDINGS: Brain: Stable bifrontal encephalomalacia. Old bilateral lacunar infarcts in the basal ganglia. No acute intracranial abnormality. Specifically, no hemorrhage, hydrocephalus, mass lesion, acute infarction, or significant intracranial injury. Vascular: No hyperdense vessel or unexpected calcification. Skull: No acute calvarial abnormality. Sinuses/Orbits: Visualized paranasal sinuses and mastoids clear. Orbital soft tissues unremarkable. Other: There is gas noted within the soft tissues in the left side of the lower face/neck posterior to the left mandible of unknown etiology. IMPRESSION: Stable bifrontal encephalomalacia. Stable old bilateral basal ganglia lacunar infarcts. No acute intracranial abnormality. Gas within the soft tissues of the lower face posterior to the left mandible of unknown etiology. Electronically Signed   By: Rolm Baptise M.D.   On: 10/30/2018 15:04   Dg Chest Port 1 View  Result Date: 10/30/2018 CLINICAL DATA:  Syncope EXAM: PORTABLE CHEST 1 VIEW COMPARISON:  10/23/2018 FINDINGS: Cardiac enlargement without heart failure. Atherosclerotic aortic arch and right carotid artery. Probable left carotid endarterectomy. Lungs clear without infiltrate effusion or mass. IMPRESSION: No active disease. Electronically Signed   By: Franchot Gallo M.D.   On: 10/30/2018 12:16    Procedures Procedures (including critical care time)  Medications Ordered in ED  Medications  atorvastatin (LIPITOR) tablet 80 mg (has no  administration in time range)  donepezil (ARICEPT) tablet 5 mg (5 mg Oral Given 10/30/18 2222)  memantine (NAMENDA) tablet 10 mg (has no administration in time range)  clopidogrel (PLAVIX) tablet 75 mg (has no administration in time range)  levETIRAcetam (KEPPRA) tablet 750 mg (750 mg Oral Given 10/30/18 2222)  calcium-vitamin D (OSCAL WITH D) 500-200 MG-UNIT per tablet 1 tablet (has no administration in time range)  sodium chloride flush (NS) 0.9 % injection 3 mL (3 mLs Intravenous Not Given 10/30/18 2222)  enoxaparin (LOVENOX) injection 40 mg (40 mg Subcutaneous Given 10/30/18 1935)  0.9 %  sodium chloride infusion ( Intravenous Rate/Dose Verify 10/31/18 0300)  acetaminophen (TYLENOL) tablet 650 mg (has no administration in time range)    Or  acetaminophen (TYLENOL) suppository 650 mg (has no administration in time range)  senna-docusate (Senokot-S) tablet 1 tablet (has no administration in time range)  ondansetron (ZOFRAN) tablet 4 mg (has no administration in time range)    Or  ondansetron (ZOFRAN) injection 4 mg (has no administration in time range)     Initial Impression / Assessment and Plan / ED Course  I have reviewed the triage vital signs and the nursing notes.  Pertinent labs & imaging results that were available during my care of the patient were reviewed by me and considered in my medical decision making (see chart for details).        7:46 AM I spoke with the patient's husband who states that yesterday when she left the hospital she was doing very well.  She is able to walk from the car into the house by herself.  They dinner together and sat on the porch for couple hours before she went to bed.  He states that she seemed to be doing well this morning when she got up to eat breakfast but as soon as she got up from the kitchen table he says that she "took one step and then just melted to the ground."  He states that her  eyes were open but she was completely unresponsive.  He says that he had to use all his my to pick her up off the ground and put her in the bed.  He says that she was very confused.  That was completely different from her status yesterday.  ZJ:IRCVELFY and syncope ams VS: HDS BO:FBPZWCH is gathered by EMR and Patient's husband, who is a reliable historian. DDX:.The differential for syncope is extensive and includes, but is not limited to: arrythmia (Vtach, SVT, SSS, sinus arrest, AV block, bradycardia) aortic stenosis, AMI, HOCM, PE, atrial myxoma, pulmonary hypertension, orthostatic hypotension, (hypovolemia, drug effect, GB syndrome, micturition, cough, swall) carotid sinus sensitivity, Seizure, TIA/CVA, hypoglycemia,  Vertigo. Labs: I reviewed the labs which shows  UA without infection, slightly elevated Gluose and mildly low albumin level without other metabolic abnormality, CBC with steadily increasing WBC and increased Neutropil count, Mild normocytic anemia likely due to recent surgery. I have low suspicion clinically for surgical site infection  Imaging: I personally reviewed the images (CT Head) which show(s) chronic encephalomalacia, sub Q gas due to recent surgical incision ENI:DPOEUM R&R, unchanged from previous tracings.  PNT:IRWERXV mental status, syncope, confusion and generalized weakness with rapid change in status after dc from the hospital less than 24 hours ago. I doubt new stroke after carotid endearterectomy Patient disposition:admission Patient condition: stable. The patient appears reasonably stabilized for admission considering the current resources, flow, and capabilities available in the ED at this time, and I doubt any  other [facility] requiring further screening and/or treatment in the ED prior to admission.   Final Clinical Impressions(s) / ED Diagnoses   Final diagnoses:  Altered mental status, unspecified altered mental status type  Weakness  Syncope, unspecified syncope  type    ED Discharge Orders    None       Margarita Mail, PA-C 10/31/18 0746    Virgel Manifold, MD 10/31/18 1220

## 2018-10-31 ENCOUNTER — Observation Stay (HOSPITAL_BASED_OUTPATIENT_CLINIC_OR_DEPARTMENT_OTHER): Payer: PPO

## 2018-10-31 DIAGNOSIS — E0590 Thyrotoxicosis, unspecified without thyrotoxic crisis or storm: Secondary | ICD-10-CM | POA: Diagnosis not present

## 2018-10-31 DIAGNOSIS — R55 Syncope and collapse: Secondary | ICD-10-CM

## 2018-10-31 DIAGNOSIS — G40909 Epilepsy, unspecified, not intractable, without status epilepticus: Secondary | ICD-10-CM

## 2018-10-31 DIAGNOSIS — Z8673 Personal history of transient ischemic attack (TIA), and cerebral infarction without residual deficits: Secondary | ICD-10-CM | POA: Diagnosis not present

## 2018-10-31 DIAGNOSIS — D72829 Elevated white blood cell count, unspecified: Secondary | ICD-10-CM | POA: Diagnosis not present

## 2018-10-31 DIAGNOSIS — R4189 Other symptoms and signs involving cognitive functions and awareness: Secondary | ICD-10-CM | POA: Diagnosis not present

## 2018-10-31 DIAGNOSIS — K8020 Calculus of gallbladder without cholecystitis without obstruction: Secondary | ICD-10-CM | POA: Diagnosis not present

## 2018-10-31 DIAGNOSIS — R531 Weakness: Secondary | ICD-10-CM

## 2018-10-31 LAB — T4, FREE: Free T4: 1.28 ng/dL (ref 0.82–1.77)

## 2018-10-31 LAB — GLUCOSE, CAPILLARY: Glucose-Capillary: 86 mg/dL (ref 70–99)

## 2018-10-31 MED ORDER — AMLODIPINE BESYLATE 10 MG PO TABS
10.0000 mg | ORAL_TABLET | Freq: Every day | ORAL | Status: DC
  Administered 2018-10-31 – 2018-11-02 (×3): 10 mg via ORAL
  Filled 2018-10-31 (×3): qty 1

## 2018-10-31 MED ORDER — ATENOLOL 25 MG PO TABS: 25.0000 mg | ORAL_TABLET | Freq: Every day | ORAL | Status: DC

## 2018-10-31 MED ORDER — BENZONATATE 100 MG PO CAPS
200.0000 mg | ORAL_CAPSULE | Freq: Three times a day (TID) | ORAL | Status: DC | PRN
  Administered 2018-10-31: 200 mg via ORAL
  Filled 2018-10-31: qty 2

## 2018-10-31 NOTE — Progress Notes (Signed)
Progress Note    Kristy Cabrera  IEP:[id number] DOB: 01/03/1951  DOA: 10/30/2018 PCP: [name], NP    Brief Narrative:   Chief complaint: Syncope  Medical records reviewed and are as summarized below:  Kristy Cabrera is an 68 y.o. female with past medical history significant for dementia, HTN, HLD, seizure disorder, osteopenia, and history of meningioma resected; who presents after patient got up from the table and took 1 step prior to collapsing.  Suspected syncope secondary to orthostatic hypotension.  Assessment/Plan:   Principal Problem:   Syncope and collapse Active Problems:   Seizure disorder (HCC)   Cognitive impairment   Subclinical hyperthyroidism   History of stroke   Leukocytosis  Syncope and collapse, suspected orthostatic hypotension: Noted to have collapse of the floor following getting up from the table.  No significant arrhythmias noted on telemetry overnight.  Suspect orthostatic hypotension, but initial orthostatics not obtained until after patient had been started on IV fluids of normal saline.  Physical therapy/Occupational Therapy worked with the patient and recommended skilled nursing facility placement/24-hour supervision.  -Fall precautions -Discontinue IV fluid -Social work consulted to help start search for skilled nursing facility placement -Follow-up telemetry  Leukocytosis: White blood cell counts noted to be elevated up to 18.9 on admission.  Patient afebrile.  Suspect this could be likely reactive in nature.  -Continue to monitor.  Recent left MCA thrombotic stroke, s/p left carotid endarterectomy: Patient had just been discharged from the hospital on 4/9 after suffering from a stroke.  Underwent left carotid endarterectomy on 4/8 with vascular surgery.  No clear signs of infection noted on physical exam of the patient's neck.  Vascular duplex Doppler ultrasound performed today did not show any acute abnormalities.   -Continue Plavix and  statin  Subclinical hypothyroidism: TSH noted to be low at 0.175, but free T4 was within normal limits at 1.28. -Will need outpatient monitoring of thyroid levels in 4 to 6 weeks.  History of seizure disorder: No seizure activity noted per husband. -Continue Keppra  Essential hypertension: At home patient on amlodipine 10 mg daily and atenolol 25 mg daily for blood pressure control per pharmacy.  Discharge summary notes patient to be on 100 mg of atenolol. -Restart back amlodipine  -Add back atenolol once dose verified   Dementia/cognitive impairment: Suspect likely multifactorial given previous history of alcohol abuse, seizure disorder, and stroke -Continue Aricept and Namenda  COPD: Stable.  History of alcohol abuse: Currently noted to be in remission  Body mass index is 29.02 kg/m.   Family Communication/Anticipated D/C date and plan/Code Status   DVT prophylaxis: Lovenox ordered. Code Status: Full Code.  Family Communication: Discussed patient's case with her husband over the phone Disposition Plan: [place] Consultants:    None.   Anti-Infectives:    None  Subjective:   Patient reports that she is was shaky prior to falling yesterday.  Talked with patient's husband over the phone who reports that she had just gotten up from the table and took 1 step and collapsed to the ground.  He reports that she is done this a few other times in the past.  He hopes that we can find skilled nursing facility placement for her at this time to get stronger.  Objective:    Vitals:   10/30/18 1952 10/31/18 0119 10/31/18 0452 10/31/18 0550  BP: 107/78 (!) 101/50 (!) 118/47 (!) 104/50  Pulse: 80 72 72 (!) 57  Resp:  20  20   Temp:  99.7 F (37.6 C) 98.7 F (37.1 C)   TempSrc:  Oral Oral   SpO2:  94% 93% 91%  Weight:   69.7 kg   Height:        Intake/Output Summary (Last 24 hours) at 10/31/2018 1145 Last data filed at 10/31/2018 1012 Gross per 24  hour  Intake 1292.39 ml  Output 0 ml  Net 1292.39 ml   Filed Weights   10/30/18 1122 10/30/18 1856 10/31/18 0452  Weight: 75.8 kg 70.5 kg 69.7 kg    Exam: Constitutional: NAD, calm, comfortable Eyes: PERRL, lids and conjunctivae normal ENMT: Mucous membranes are moist. Posterior pharynx clear of any exudate or lesions. Hard of hearing. Neck: Left-sided surgical incision appears to be healing without any significant erythema noted. Respiratory: clear to auscultation bilaterally, no wheezing, no crackles. Normal respiratory effort. No accessory muscle use.  Cardiovascular: Regular rate and rhythm, no murmurs / rubs / gallops. No extremity edema. 2+ pedal pulses. No carotid bruits.  Abdomen: no tenderness, no masses palpated. No hepatosplenomegaly. Bowel sounds positive.  Musculoskeletal: no clubbing / cyanosis. No joint deformity upper and lower extremities. Good ROM, no contractures. Normal muscle tone.  Skin: no rashes, lesions, ulcers. No induration Neurologic: CN 2-12 grossly intact.  Patient able to move all extremities, but appears to have  generalized weakness of the lower extremities.  Speech is slightly slurred. Psychiatric: Normal judgment and insight. Alert and oriented x 3. Normal mood.    Data Reviewed:   I have personally reviewed following labs and imaging studies:  Labs: Labs show the following:   Basic Metabolic Panel: Recent Labs  Lab 10/25/18 0341 10/28/18 0332 10/29/18 0350 10/30/18 1137 10/30/18 1917  NA 138 138 136 137  --   K 3.8 3.8 4.3 3.9  --   CL 107 111 107 102  --   CO2 22 22 21* 25  --   GLUCOSE 86 84 145* 135*  --   BUN 23 17 21 13   --   CREATININE 0.75 0.64 1.06* 0.79 0.66  CALCIUM 9.5 9.0 8.4* 9.3  --    GFR Estimated Creatinine Clearance: 60.1 mL/min (by C-G formula based on SCr of 0.66 mg/dL). Liver Function Tests: Recent Labs  Lab 10/30/18 1137  AST 24  ALT 15  ALKPHOS 127*  BILITOT 0.4  PROT 6.7  ALBUMIN 3.3*   No results  for input(s): LIPASE, AMYLASE in the last 168 hours. No results for input(s): AMMONIA in the last 168 hours. Coagulation profile Recent Labs  Lab 10/25/18 0341 10/30/18 1137  INR 1.0 1.0    CBC: Recent Labs  Lab 10/25/18 0341 10/28/18 0332 10/29/18 0350 10/30/18 1137 10/30/18 1917  WBC 10.2 10.7* 15.9* 18.3* 17.3*  NEUTROABS  --   --   --  14.5*  --   HGB 13.9 12.1 9.6* 11.2* 10.9*  HCT 41.4 37.7 29.3* 35.2* 33.1*  MCV 88.8 90.0 89.9 90.7 89.5  PLT 296 266 236 287 315   Cardiac Enzymes: No results for input(s): CKTOTAL, CKMB, CKMBINDEX, TROPONINI in the last 168 hours. BNP (last 3 results) No results for input(s): PROBNP in the last 8760 hours. CBG: Recent Labs  Lab 10/30/18 1155 10/31/18 0453  GLUCAP 120* 86   D-Dimer: No results for input(s): DDIMER in the last 72 hours. Hgb A1c: No results for input(s): HGBA1C in the last 72 hours. Lipid Profile: No results for input(s): CHOL, HDL, LDLCALC, TRIG, CHOLHDL, LDLDIRECT in the last 72 hours.  Thyroid function studies: Recent Labs    10/30/18 1917  TSH 0.175*   Anemia work up: No results for input(s): VITAMINB12, FOLATE, FERRITIN, TIBC, IRON, RETICCTPCT in the last 72 hours. Sepsis Labs: Recent Labs  Lab 10/28/18 0332 10/29/18 0350 10/30/18 1137 10/30/18 1917  WBC 10.7* 15.9* 18.3* 17.3*    Microbiology Recent Results (from the past 240 hour(s))  Culture, Urine     Status: Abnormal   Collection Time: 10/26/18  3:13 AM  Result Value Ref Range Status   Specimen Description URINE, RANDOM  Final   Special Requests NONE  Final   Culture (A)  Final    <10,000 COLONIES/mL INSIGNIFICANT GROWTH Performed at [facility], [address] [place], [place] [address]    Report Status 10/27/2018 FINAL  Final  Surgical pcr screen     Status: None   Collection Time: 10/28/18  6:09 AM  Result Value Ref Range Status   MRSA, PCR NEGATIVE NEGATIVE Final   Staphylococcus aureus NEGATIVE NEGATIVE Final     Comment: (NOTE) The Xpert SA Assay (FDA approved for NASAL specimens in patients 31 years of age and older), is one component of a comprehensive surveillance program. It is not intended to diagnose infection nor to guide or monitor treatment. Performed at [facility], [place] [address], [place], [place] [address]     Procedures and diagnostic studies:  Ct Head Wo Contrast  Result Date: 10/30/2018 CLINICAL DATA:  Altered level of consciousness. EXAM: CT HEAD WITHOUT CONTRAST TECHNIQUE: Contiguous axial images were obtained from the base of the skull through the vertex without intravenous contrast. COMPARISON:  CT 10/23/2018 FINDINGS: Brain: Stable bifrontal encephalomalacia. Old bilateral lacunar infarcts in the basal ganglia. No acute intracranial abnormality. Specifically, no hemorrhage, hydrocephalus, mass lesion, acute infarction, or significant intracranial injury. Vascular: No hyperdense vessel or unexpected calcification. Skull: No acute calvarial abnormality. Sinuses/Orbits: Visualized paranasal sinuses and mastoids clear. Orbital soft tissues unremarkable. Other: There is gas noted within the soft tissues in the left side of the lower face/neck posterior to the left mandible of unknown etiology. IMPRESSION: Stable bifrontal encephalomalacia. Stable old bilateral basal ganglia lacunar infarcts. No acute intracranial abnormality. Gas within the soft tissues of the lower face posterior to the left mandible of unknown etiology. Electronically Signed   By: Rolm Baptise M.D.   On: 10/30/2018 15:04   Dg Chest Port 1 View  Result Date: 10/30/2018 CLINICAL DATA:  Syncope EXAM: PORTABLE CHEST 1 VIEW COMPARISON:  10/23/2018 FINDINGS: Cardiac enlargement without heart failure. Atherosclerotic aortic arch and right carotid artery. Probable left carotid endarterectomy. Lungs clear without infiltrate effusion or mass. IMPRESSION: No active disease. Electronically Signed   By: Franchot Gallo M.D.   On:  10/30/2018 12:16    Medications:   . atorvastatin  80 mg Oral q1800  . calcium-vitamin D  1 tablet Oral BID  . clopidogrel  75 mg Oral Daily  . donepezil  5 mg Oral QHS  . enoxaparin (LOVENOX) injection  40 mg Subcutaneous Q24H  . levETIRAcetam  750 mg Oral BID  . memantine  10 mg Oral Daily  . sodium chloride flush  3 mL Intravenous Q12H   Continuous Infusions:   LOS: 0 days    A   [facility]   *Please refer to [email or web address], password [email or web address] to get updated schedule on who will round on this patient, as hospitalists switch teams weekly. If 7PM-7AM, please contact night-coverage at [email or web address], password [email or web address] for any overnight needs.

## 2018-10-31 NOTE — Progress Notes (Signed)
Patient assisted back to bed after being up in recliner chair x several hours. Patient tolerated being in chair very well. Ate her lunch in chair.  Patient back in bed with purewick in place. IV infusing per order of NS/75/hr via right AC.  No distress noted.

## 2018-10-31 NOTE — Care Management Obs Status (Signed)
[facility] NOTIFICATION   Patient Details  Name: Kristy Cabrera MRN: [id number] Date of Birth: [birth year]   Medicare Observation Status Notification Given:  Yes    [name] [name], LCSW 10/31/2018, 2:39 PM

## 2018-10-31 NOTE — Evaluation (Signed)
Physical Therapy Evaluation Patient Details Name: Kristy Cabrera MRN: [id number] DOB: 29-Jan-1951 Today's Date: 10/31/2018   History of Present Illness  Pt readmitted with near syncope at home after recent admission for lt CVA and lt CEA on 4/8. PMH - htn, seizure, meningioma, dementia  Clinical Impression  Pt admitted with above diagnosis and presents to PT with functional limitations due to deficits listed below (See PT problem list). Pt needs skilled PT to maximize independence and safety to allow discharge to ST-SNF. Pt remains weak and was not successful with transition home. Feel she needs further therapy prior to return home.     Follow Up Recommendations SNF    Equipment Recommendations  None recommended by PT    Recommendations for Other Services       Precautions / Restrictions Precautions Precautions: Fall Restrictions Weight Bearing Restrictions: No      Mobility  Bed Mobility Overal bed mobility: Needs Assistance Bed Mobility: Supine to Sit     Supine to sit: Min assist;HOB elevated     General bed mobility comments: Assist to elevate trunk into sitting. Incr time  Transfers Overall transfer level: Needs assistance Equipment used: 4-wheeled walker Transfers: Sit to/from Stand Sit to Stand: Min assist         General transfer comment: Assist to bring hips up and for balance.   Ambulation/Gait Ambulation/Gait assistance: Min assist Gait Distance (Feet): 60 Feet Assistive device: 4-wheeled walker Gait Pattern/deviations: Step-through pattern;Decreased step length - right;Decreased step length - left;Shuffle;Trunk flexed Gait velocity: decr Gait velocity interpretation: <1.31 ft/sec, indicative of household ambulator General Gait Details: Assist for balance and support.  Stairs            Wheelchair Mobility    Modified Rankin (Stroke Patients Only)       Balance Overall balance assessment: Needs assistance Sitting-balance support: No  upper extremity supported;Feet supported Sitting balance-Leahy Scale: Good     Standing balance support: Bilateral upper extremity supported Standing balance-Leahy Scale: Poor Standing balance comment: rollator and min guard for static standing                             Pertinent Vitals/Pain Pain Assessment: No/denies pain    Home Living Family/patient expects to be discharged to:: Skilled nursing facility Living Arrangements: Spouse/significant other Available Help at Discharge: Family;Available 24 hours/day Type of Home: House Home Access: Stairs to enter Entrance Stairs-Rails: Psychiatric nurse of Steps: 4 Home Layout: Two level;Able to live on main level with bedroom/bathroom Home Equipment: Gilford Rile - 2 wheels;Shower seat - built in;Walker - 4 wheels      Prior Function Level of Independence: Needs assistance   Gait / Transfers Assistance Needed: prior to CVA was amb with rollator modified independent           Hand Dominance   Dominant Hand: Right    Extremity/Trunk Assessment   Upper Extremity Assessment Upper Extremity Assessment: Defer to OT evaluation    Lower Extremity Assessment Lower Extremity Assessment: Generalized weakness    Cervical / Trunk Assessment Cervical / Trunk Assessment: Kyphotic  Communication   Communication: HOH  Cognition Arousal/Alertness: Awake/alert Behavior During Therapy: Flat affect Overall Cognitive Status: No family/caregiver present to determine baseline cognitive functioning Area of Impairment: Memory;Following commands;Safety/judgement;Problem solving                     Memory: Decreased short-term memory Following Commands: Follows one step commands with increased  time Safety/Judgement: Decreased awareness of deficits   Problem Solving: Slow processing;Decreased initiation        General Comments      Exercises     Assessment/Plan    PT Assessment Patient needs  continued PT services  PT Problem List Decreased strength;Decreased activity tolerance;Decreased balance;Decreased mobility       PT Treatment Interventions DME instruction;Gait training;Functional mobility training;Therapeutic activities;Therapeutic exercise;Balance training;Cognitive remediation;Patient/family education    PT Goals (Current goals can be found in the Care Plan section)  Acute Rehab PT Goals Patient Stated Goal: not stated PT Goal Formulation: With patient Time For Goal Achievement: 11/14/18 Potential to Achieve Goals: Good    Frequency Min 3X/week   Barriers to discharge Inaccessible home environment stairs to enter    Co-evaluation               AM-PAC PT "6 Clicks" Mobility  Outcome Measure Help needed turning from your back to your side while in a flat bed without using bedrails?: A Little Help needed moving from lying on your back to sitting on the side of a flat bed without using bedrails?: A Little Help needed moving to and from a bed to a chair (including a wheelchair)?: A Little Help needed standing up from a chair using your arms (e.g., wheelchair or bedside chair)?: A Little Help needed to walk in hospital room?: A Little Help needed climbing 3-5 steps with a railing? : A Lot 6 Click Score: 17    End of Session Equipment Utilized During Treatment: Gait belt Activity Tolerance: Patient limited by fatigue Patient left: in chair;with call bell/phone within reach;with chair alarm set Nurse Communication: Mobility status;Other (comment)(replace purewick) PT Visit Diagnosis: Unsteadiness on feet (R26.81);Other abnormalities of gait and mobility (R26.89);Muscle weakness (generalized) (M62.81)    Time: 6754-4920 PT Time Calculation (min) (ACUTE ONLY): 34 min   Charges:   PT Evaluation $PT Eval Moderate Complexity: 1 Mod PT Treatments $Gait Training: 8-22 mins        [place] Pager [phone] Office  [place] 10/31/2018, 10:12 AM

## 2018-10-31 NOTE — Progress Notes (Signed)
Carotid artery duplex completed. Refer to "CV Proc" under chart review to view preliminary results.  10/31/2018 2:48 PM Maudry Mayhew, MHA, RVT, RDCS, RDMS

## 2018-11-01 DIAGNOSIS — Z9884 Bariatric surgery status: Secondary | ICD-10-CM | POA: Diagnosis not present

## 2018-11-01 DIAGNOSIS — R4189 Other symptoms and signs involving cognitive functions and awareness: Secondary | ICD-10-CM | POA: Diagnosis not present

## 2018-11-01 DIAGNOSIS — G4730 Sleep apnea, unspecified: Secondary | ICD-10-CM | POA: Diagnosis not present

## 2018-11-01 DIAGNOSIS — I10 Essential (primary) hypertension: Secondary | ICD-10-CM | POA: Diagnosis not present

## 2018-11-01 DIAGNOSIS — D242 Benign neoplasm of left breast: Secondary | ICD-10-CM | POA: Diagnosis not present

## 2018-11-01 DIAGNOSIS — R55 Syncope and collapse: Secondary | ICD-10-CM | POA: Diagnosis not present

## 2018-11-01 DIAGNOSIS — E119 Type 2 diabetes mellitus without complications: Secondary | ICD-10-CM | POA: Diagnosis not present

## 2018-11-01 DIAGNOSIS — Z8673 Personal history of transient ischemic attack (TIA), and cerebral infarction without residual deficits: Secondary | ICD-10-CM | POA: Diagnosis not present

## 2018-11-01 DIAGNOSIS — Z6841 Body Mass Index (BMI) 40.0 and over, adult: Secondary | ICD-10-CM | POA: Diagnosis not present

## 2018-11-01 DIAGNOSIS — K219 Gastro-esophageal reflux disease without esophagitis: Secondary | ICD-10-CM | POA: Diagnosis not present

## 2018-11-01 DIAGNOSIS — Z7984 Long term (current) use of oral hypoglycemic drugs: Secondary | ICD-10-CM | POA: Diagnosis not present

## 2018-11-01 DIAGNOSIS — M1990 Unspecified osteoarthritis, unspecified site: Secondary | ICD-10-CM | POA: Diagnosis not present

## 2018-11-01 DIAGNOSIS — Z79899 Other long term (current) drug therapy: Secondary | ICD-10-CM | POA: Diagnosis not present

## 2018-11-01 LAB — T3, FREE: T3, Free: 2.2 pg/mL (ref 2.0–4.4)

## 2018-11-01 LAB — CBC WITH DIFFERENTIAL/PLATELET
Abs Immature Granulocytes: 0.04 10*3/uL (ref 0.00–0.07)
Basophils Absolute: 0.1 10*3/uL (ref 0.0–0.1)
Basophils Relative: 0 %
Eosinophils Absolute: 0.1 10*3/uL (ref 0.0–0.5)
Eosinophils Relative: 1 %
HCT: 30.8 % — ABNORMAL LOW (ref 36.0–46.0)
Hemoglobin: 9.7 g/dL — ABNORMAL LOW (ref 12.0–15.0)
Immature Granulocytes: 0 %
Lymphocytes Relative: 22 %
Lymphs Abs: 2.8 10*3/uL (ref 0.7–4.0)
MCH: 28.7 pg (ref 26.0–34.0)
MCHC: 31.5 g/dL (ref 30.0–36.0)
MCV: 91.1 fL (ref 80.0–100.0)
Monocytes Absolute: 1.3 10*3/uL — ABNORMAL HIGH (ref 0.1–1.0)
Monocytes Relative: 10 %
Neutro Abs: 8.3 10*3/uL — ABNORMAL HIGH (ref 1.7–7.7)
Neutrophils Relative %: 67 %
Platelets: 259 10*3/uL (ref 150–400)
RBC: 3.38 MIL/uL — ABNORMAL LOW (ref 3.87–5.11)
RDW: 12.2 % (ref 11.5–15.5)
WBC: 12.7 10*3/uL — ABNORMAL HIGH (ref 4.0–10.5)
nRBC: 0 % (ref 0.0–0.2)

## 2018-11-01 LAB — URINE CULTURE: Culture: <10000 — AB

## 2018-11-01 LAB — GLUCOSE, CAPILLARY: Glucose-Capillary: 129 mg/dL — ABNORMAL HIGH (ref 70–99)

## 2018-11-01 MED ORDER — BISACODYL 10 MG RE SUPP: 10.0000 mg | Freq: Every day | RECTAL | Status: DC | PRN

## 2018-11-01 NOTE — Progress Notes (Signed)
Progress Note    Annalycia Done  ERX:[id number] DOB: 08/28/1950  DOA: 10/30/2018 PCP: [name], NP    Brief Narrative:   Chief complaint: Syncope  Medical records reviewed and are as summarized below:  Acire Tang is an 68 y.o. female with past medical history significant for dementia, HTN, HLD, seizure disorder, osteopenia, and history of meningioma resected; who presents after patient got up from the table and took 1 step prior to collapsing.  Suspected syncope secondary to orthostatic hypotension/bradycardia.  Assessment/Plan:   Principal Problem:   Syncope and collapse Active Problems:   Seizure disorder (HCC)   Cognitive impairment   Subclinical hyperthyroidism   History of stroke   Leukocytosis  Syncope and collapse, suspected orthostatic hypotension/bradycardia:  -reported to have collapsed after getting up from the table.  No significant arrhythmias noted on telemetry overnight.   -Suspect orthostatic hypotension, but initial orthostatics not obtained until after patient had been started on IV fluids of normal saline.  - Physical therapy/Occupational Therapy worked with the patient and recommended skilled nursing facility placement/24-hour supervision.  -Social work consulted to help start search for skilled nursing facility placement   Leukocytosis: White blood cell counts noted to be elevated up to 18.9 on admission.  Patient afebrile.  Suspect this could be likely reactive in nature.  -trending down -no sign of infection  Recent left MCA thrombotic stroke, s/p left carotid endarterectomy:  -Patient had just been discharged from the hospital on 4/9 after suffering from a stroke.  Underwent left carotid endarterectomy on 4/8 with vascular surgery.  No clear signs of infection noted on physical exam of the patient's neck.  Vascular duplex Doppler ultrasound performed today did not show any acute abnormalities.   -Continue Plavix and statin  Subclinical  hypothyroidism: TSH noted to be low at 0.175, but free T4 was within normal limits at 1.28. -Will need outpatient monitoring of thyroid levels in 4 to 6 weeks.  History of seizure disorder: No seizure activity noted per husband. -Continue Keppra  Essential hypertension: At home patient on amlodipine 10 mg daily and atenolol 25 mg daily for blood pressure control per pharmacy.  Discharge summary notes patient to be on 100 mg of atenolol. -Restart back amlodipine  -d/c atenolol- see H&P-- there was concern for bradycardia   Dementia/cognitive impairment: Suspect likely multifactorial given previous history of alcohol abuse, seizure disorder, and stroke -Continue Aricept and Namenda  COPD: Stable.  History of alcohol abuse:  - in remission  obesity Body mass index is 30.18 kg/m.   Family Communication/Anticipated D/C date and plan/Code Status   DVT prophylaxis: Lovenox ordered. Code Status: Full Code.  Family Communication:  Disposition Plan: Skilled nursing facility     Subjective:   No current complaints  Objective:    Vitals:   10/31/18 1926 11/01/18 0015 11/01/18 0538 11/01/18 0759  BP: (!) 148/71 125/61 (!) 138/44 (!) 159/61  Pulse: 94 63 61 77  Resp: 20 16 20    Temp: 99.2 F (37.3 C) 98.1 F (36.7 C) 98.1 F (36.7 C)   TempSrc: Oral Oral Oral   SpO2: 95% 99% 98% 96%  Weight:   72.4 kg   Height:        Intake/Output Summary (Last 24 hours) at 11/01/2018 1014 Last data filed at 11/01/2018 0807 Gross per 24 hour  Intake 600 ml  Output 500 ml  Net 100 ml   Filed Weights   10/30/18 1856 10/31/18 0452 11/01/18 0538  Weight: 70.5 kg  69.7 kg 72.4 kg    Exam: In bed, NAD- being cleaned up by NT Alert but speech difficult to understand rrr No increased work of breathing No LE Edema  Data Reviewed:   I have personally reviewed following labs and imaging studies:  Labs: Labs show the following:   Basic Metabolic Panel: Recent Labs  Lab 10/28/18  0332 10/29/18 0350 10/30/18 1137 10/30/18 1917  NA 138 136 137  --   K 3.8 4.3 3.9  --   CL 111 107 102  --   CO2 22 21* 25  --   GLUCOSE 84 145* 135*  --   BUN 17 21 13   --   CREATININE 0.64 1.06* 0.79 0.66  CALCIUM 9.0 8.4* 9.3  --    GFR Estimated Creatinine Clearance: 61.2 mL/min (by C-G formula based on SCr of 0.66 mg/dL). Liver Function Tests: Recent Labs  Lab 10/30/18 1137  AST 24  ALT 15  ALKPHOS 127*  BILITOT 0.4  PROT 6.7  ALBUMIN 3.3*   No results for input(s): LIPASE, AMYLASE in the last 168 hours. No results for input(s): AMMONIA in the last 168 hours. Coagulation profile Recent Labs  Lab 10/30/18 1137  INR 1.0    CBC: Recent Labs  Lab 10/28/18 0332 10/29/18 0350 10/30/18 1137 10/30/18 1917 11/01/18 0503  WBC 10.7* 15.9* 18.3* 17.3* 12.7*  NEUTROABS  --   --  14.5*  --  8.3*  HGB 12.1 9.6* 11.2* 10.9* 9.7*  HCT 37.7 29.3* 35.2* 33.1* 30.8*  MCV 90.0 89.9 90.7 89.5 91.1  PLT 266 236 287 315 259   Cardiac Enzymes: No results for input(s): CKTOTAL, CKMB, CKMBINDEX, TROPONINI in the last 168 hours. BNP (last 3 results) No results for input(s): PROBNP in the last 8760 hours. CBG: Recent Labs  Lab 10/30/18 1155 10/31/18 0453 11/01/18 0757  GLUCAP 120* 86 129*   D-Dimer: No results for input(s): DDIMER in the last 72 hours. Hgb A1c: No results for input(s): HGBA1C in the last 72 hours. Lipid Profile: No results for input(s): CHOL, HDL, LDLCALC, TRIG, CHOLHDL, LDLDIRECT in the last 72 hours. Thyroid function studies: Recent Labs    10/30/18 1917 10/31/18 0738  TSH 0.175*  --   T3FREE  --  2.2   Anemia work up: No results for input(s): VITAMINB12, FOLATE, FERRITIN, TIBC, IRON, RETICCTPCT in the last 72 hours. Sepsis Labs: Recent Labs  Lab 10/29/18 0350 10/30/18 1137 10/30/18 1917 11/01/18 0503  WBC 15.9* 18.3* 17.3* 12.7*    Microbiology Recent Results (from the past 240 hour(s))  Culture, Urine     Status: Abnormal    Collection Time: 10/26/18  3:13 AM  Result Value Ref Range Status   Specimen Description URINE, RANDOM  Final   Special Requests NONE  Final   Culture (A)  Final    <10,000 COLONIES/mL INSIGNIFICANT GROWTH Performed at [facility], [address], [place], [place] [address]    Report Status 10/27/2018 FINAL  Final  Surgical pcr screen     Status: None   Collection Time: 10/28/18  6:09 AM  Result Value Ref Range Status   MRSA, PCR NEGATIVE NEGATIVE Final   Staphylococcus aureus NEGATIVE NEGATIVE Final    Comment: (NOTE) The Xpert SA Assay (FDA approved for NASAL specimens in patients 32 years of age and older), is one component of a comprehensive surveillance program. It is not intended to diagnose infection nor to guide or monitor treatment. Performed at [facility],  [address] [place], [place] [address]   Urine culture     Status: Abnormal   Collection Time: 10/30/18  6:55 PM  Result Value Ref Range Status   Specimen Description URINE, RANDOM  Final   Special Requests NONE  Final   Culture (A)  Final    <10,000 COLONIES/mL INSIGNIFICANT GROWTH Performed at [facility], [place] [address] [place], [place] [address]    Report Status 11/01/2018 FINAL  Final    Procedures and diagnostic studies:  Ct Head Wo Contrast  Result Date: 10/30/2018 CLINICAL DATA:  Altered level of consciousness. EXAM: CT HEAD WITHOUT CONTRAST TECHNIQUE: Contiguous axial images were obtained from the base of the skull through the vertex without intravenous contrast. COMPARISON:  CT 10/23/2018 FINDINGS: Brain: Stable bifrontal encephalomalacia. Old bilateral lacunar infarcts in the basal ganglia. No acute intracranial abnormality. Specifically, no hemorrhage, hydrocephalus, mass lesion, acute infarction, or significant intracranial injury. Vascular: No hyperdense vessel or unexpected calcification. Skull: No acute calvarial abnormality. Sinuses/Orbits: Visualized paranasal sinuses and  mastoids clear. Orbital soft tissues unremarkable. Other: There is gas noted within the soft tissues in the left side of the lower face/neck posterior to the left mandible of unknown etiology. IMPRESSION: Stable bifrontal encephalomalacia. Stable old bilateral basal ganglia lacunar infarcts. No acute intracranial abnormality. Gas within the soft tissues of the lower face posterior to the left mandible of unknown etiology. Electronically Signed   By: Rolm Baptise M.D.   On: 10/30/2018 15:04   Dg Chest Port 1 View  Result Date: 10/30/2018 CLINICAL DATA:  Syncope EXAM: PORTABLE CHEST 1 VIEW COMPARISON:  10/23/2018 FINDINGS: Cardiac enlargement without heart failure. Atherosclerotic aortic arch and right carotid artery. Probable left carotid endarterectomy. Lungs clear without infiltrate effusion or mass. IMPRESSION: No active disease. Electronically Signed   By: Franchot Gallo M.D.   On: 10/30/2018 12:16   Vas US Carotid  Result Date: 10/31/2018 Carotid Arterial Duplex Study Indications: Syncope and Endarterectomy 10/28/2018. Performing Technologist: Maudry Mayhew MHA, RDMS, RVT, RDCS  Examination Guidelines: A complete evaluation includes B-mode imaging, spectral Doppler, color Doppler, and power Doppler as needed of all accessible portions of each vessel. Bilateral testing is considered an integral part of a complete examination. Limited examinations for reoccurring indications may be performed as noted.  Right Carotid Findings: +----------+--------+-------+--------+--------------------------------+--------+           PSV cm/sEDV    StenosisDescribe                        Comments                   cm/s                                                    +----------+--------+-------+--------+--------------------------------+--------+ CCA Prox  86      11             smooth, heterogenous and                                                  calcific                                  +----------+--------+-------+--------+--------------------------------+--------+  CCA Distal70      10             smooth and heterogenous                  +----------+--------+-------+--------+--------------------------------+--------+ ICA Prox  78      20             smooth, heterogenous and                                                  calcific                                 +----------+--------+-------+--------+--------------------------------+--------+ ICA Distal172     38                                                      +----------+--------+-------+--------+--------------------------------+--------+ ECA       138     14                                                      +----------+--------+-------+--------+--------------------------------+--------+ +----------+--------+-------+----------------+-------------------+           PSV cm/sEDV cmsDescribe        Arm Pressure (mmHG) +----------+--------+-------+----------------+-------------------+ [id number]             Multiphasic, WNL                    +----------+--------+-------+----------------+-------------------+ +---------+--------+--+--------+--+---------+ VertebralPSV cm/s53EDV cm/s14Antegrade +---------+--------+--+--------+--+---------+  Left Carotid Findings: +----------+--------+--------+--------+--------------------------+--------+           PSV cm/sEDV cm/sStenosisDescribe                  Comments +----------+--------+--------+--------+--------------------------+--------+ CCA Prox  28      11                                                 +----------+--------+--------+--------+--------------------------+--------+ CCA Distal37      7                                                  +----------+--------+--------+--------+--------------------------+--------+ ICA Prox                          heterogenous and irregular          +----------+--------+--------+--------+--------------------------+--------+ ICA Mid   86      21                                                 +----------+--------+--------+--------+--------------------------+--------+ ICA Distal98  29                                                 +----------+--------+--------+--------+--------------------------+--------+ ECA       169     14                                                 +----------+--------+--------+--------+--------------------------+--------+ +----------+--------+--------+----------------+-------------------+ SubclavianPSV cm/sEDV cm/sDescribe        Arm Pressure (mmHG) +----------+--------+--------+----------------+-------------------+           116             Multiphasic, WNL                    +----------+--------+--------+----------------+-------------------+ +---------+--------+--------+--------------+ VertebralPSV cm/sEDV cm/sNot identified +---------+--------+--------+--------------+  Summary: Right Carotid: Velocities in the right ICA are consistent with a 1-39% stenosis. Left Carotid: Velocities in the left ICA are consistent with a 1-39% stenosis.               Left internal carotid artery endarterectomy site appears to have               intraluminal heterogenous echoes. Vertebrals:  Right vertebral artery demonstrates antegrade flow. Left vertebral              artery was not visualized. Subclavians: Normal flow hemodynamics were seen in bilateral subclavian              arteries. *See table(s) above for measurements and observations.     Preliminary     Medications:   . amLODipine  10 mg Oral Daily  . atorvastatin  80 mg Oral q1800  . calcium-vitamin D  1 tablet Oral BID  . clopidogrel  75 mg Oral Daily  . donepezil  5 mg Oral QHS  . enoxaparin (LOVENOX) injection  40 mg Subcutaneous Q24H  . levETIRAcetam  750 mg Oral BID  . memantine  10 mg Oral Daily  . sodium chloride flush  3 mL  Intravenous Q12H   Continuous Infusions:   LOS: 0 days   Geradine Girt  [facility]   *Please refer to [email or web address], password [email or web address] to get updated schedule on who will round on this patient, as hospitalists switch teams weekly. If 7PM-7AM, please contact night-coverage at [email or web address], password [email or web address] for any overnight needs.

## 2018-11-01 NOTE — Evaluation (Signed)
Occupational Therapy Evaluation Patient Details Name: Kristy Cabrera MRN: [id number] DOB: 11/01/1950 Today's Date: 11/01/2018    History of Present Illness Pt readmitted with near syncope at home after recent admission for lt CVA and lt CEA on 4/8. PMH - htn, seizure, meningioma, dementia   Clinical Impression   Pt admitted for above and limited by problem list below, including impaired cognition, generalized weakness, impaired balance and decreased activity tolerance.  Patient requires min assist for basic transfers, min assist for UB ADLs, mod assist for LB ADls, and total assist for toileting. She requires increased time and effort for all activities, fatigues easily.  She will benefit from continued OT services while admitted and after dc at SNF level in order to optimize independence and safety with ADLs/mobility prior to return home.      Follow Up Recommendations  SNF;Supervision/Assistance - 24 hour    Equipment Recommendations  Other (comment)(TBD at next venue of care)    Recommendations for Other Services       Precautions / Restrictions Precautions Precautions: Fall Restrictions Weight Bearing Restrictions: No      Mobility Bed Mobility Overal bed mobility: Needs Assistance Bed Mobility: Supine to Sit     Supine to sit: Min assist;HOB elevated     General bed mobility comments: Assist to elevate trunk into sitting. Incr time  Transfers Overall transfer level: Needs assistance Equipment used: Rolling walker (2 wheeled) Transfers: Sit to/from Stand Sit to Stand: Min assist         General transfer comment: assist to power up into standing, increased time required, cueing for hand placement     Balance Overall balance assessment: Needs assistance Sitting-balance support: No upper extremity supported;Feet supported Sitting balance-Leahy Scale: Fair     Standing balance support: Bilateral upper extremity supported;During functional activity Standing  balance-Leahy Scale: Poor Standing balance comment: relaint on B UE and external support                           ADL either performed or assessed with clinical judgement   ADL Overall ADL's : Needs assistance/impaired     Grooming: Set up;Sitting   Upper Body Bathing: Supervision/ safety;Sitting   Lower Body Bathing: Moderate assistance;Sit to/from stand   Upper Body Dressing : Minimal assistance;Sitting   Lower Body Dressing: Sit to/from stand;Maximal assistance   Toilet Transfer: Minimal assistance;RW;Ambulation Toilet Transfer Details (indicate cue type and reason): simulated in room Toileting- Clothing Manipulation and Hygiene: Total assistance;Sit to/from stand       Functional mobility during ADLs: Minimal assistance;Rolling walker General ADL Comments: limited by cognition, generalized weakness, and poor activity tolerance      Vision   Vision Assessment?: No apparent visual deficits     Perception     Praxis      Pertinent Vitals/Pain Pain Assessment: 0-10 Pain Score: 2  Pain Location: neck Pain Descriptors / Indicators: Aching;Discomfort Pain Intervention(s): Monitored during session;Patient requesting pain meds-RN notified     Hand Dominance Right   Extremity/Trunk Assessment Upper Extremity Assessment Upper Extremity Assessment: Generalized weakness   Lower Extremity Assessment Lower Extremity Assessment: Defer to PT evaluation   Cervical / Trunk Assessment Cervical / Trunk Assessment: Kyphotic   Communication Communication Communication: HOH   Cognition Arousal/Alertness: Awake/alert Behavior During Therapy: Flat affect Overall Cognitive Status: No family/caregiver present to determine baseline cognitive functioning Area of Impairment: Memory;Following commands;Safety/judgement;Problem solving  Memory: Decreased short-term memory Following Commands: Follows one step commands with increased  time Safety/Judgement: Decreased awareness of deficits   Problem Solving: Slow processing;Decreased initiation;Requires verbal cues General Comments: pt requires increased time to process and problem sovle needs   General Comments       Exercises     Shoulder Instructions      Home Living Family/patient expects to be discharged to:: Skilled nursing facility                                        Prior Functioning/Environment Level of Independence: Needs assistance    ADL's / Homemaking Assistance Needed: reports some assist with ADLs since CVA, prior to CVA was mostly independent              OT Problem List: Decreased strength;Decreased range of motion;Decreased activity tolerance;Impaired balance (sitting and/or standing);Decreased knowledge of use of DME or AE;Decreased knowledge of precautions;Decreased coordination;Decreased safety awareness;Decreased cognition      OT Treatment/Interventions: Self-care/ADL training;Therapeutic exercise;DME and/or AE instruction;Therapeutic activities;Cognitive remediation/compensation;Patient/family education;Balance training    OT Goals(Current goals can be found in the care plan section) Acute Rehab OT Goals Patient Stated Goal: none stated  Time For Goal Achievement: 11/15/18 Potential to Achieve Goals: Good  OT Frequency: Min 2X/week   Barriers to D/C:            Co-evaluation              AM-PAC OT "6 Clicks" Daily Activity     Outcome Measure Help from another person eating meals?: A Little Help from another person taking care of personal grooming?: A Little Help from another person toileting, which includes using toliet, bedpan, or urinal?: Total Help from another person bathing (including washing, rinsing, drying)?: A Lot Help from another person to put on and taking off regular upper body clothing?: A Little Help from another person to put on and taking off regular lower body clothing?: A Lot 6  Click Score: 14   End of Session Equipment Utilized During Treatment: Rolling walker Nurse Communication: Mobility status  Activity Tolerance: Patient tolerated treatment well Patient left: in chair;with call bell/phone within reach;with chair alarm set;with nursing/sitter in room  OT Visit Diagnosis: Other abnormalities of gait and mobility (R26.89);Muscle weakness (generalized) (M62.81);Other symptoms and signs involving cognitive function                Time: 1224-8250 OT Time Calculation (min): 28 min Charges:  OT General Charges $OT Visit: 1 Visit OT Evaluation $OT Eval Moderate Complexity: 1 Mod OT Treatments $Self Care/Home Management : 8-22 mins  Delight Stare, OT Acute Rehabilitation Services Pager [phone] Office [phone]   Delight Stare 11/01/2018, 4:24 PM

## 2018-11-02 ENCOUNTER — Telehealth: Payer: Self-pay | Admitting: Vascular Surgery

## 2018-11-02 ENCOUNTER — Encounter: Payer: Self-pay | Admitting: *Deleted

## 2018-11-02 DIAGNOSIS — J449 Chronic obstructive pulmonary disease, unspecified: Secondary | ICD-10-CM | POA: Diagnosis not present

## 2018-11-02 DIAGNOSIS — Z23 Encounter for immunization: Secondary | ICD-10-CM | POA: Diagnosis not present

## 2018-11-02 DIAGNOSIS — R4189 Other symptoms and signs involving cognitive functions and awareness: Secondary | ICD-10-CM | POA: Diagnosis not present

## 2018-11-02 DIAGNOSIS — R55 Syncope and collapse: Secondary | ICD-10-CM | POA: Diagnosis not present

## 2018-11-02 DIAGNOSIS — Z8673 Personal history of transient ischemic attack (TIA), and cerebral infarction without residual deficits: Secondary | ICD-10-CM | POA: Diagnosis not present

## 2018-11-02 LAB — BASIC METABOLIC PANEL
Anion gap: 12 (ref 5–15)
BUN: 16 mg/dL (ref 8–23)
CO2: 24 mmol/L (ref 22–32)
Calcium: 9.6 mg/dL (ref 8.9–10.3)
Chloride: 105 mmol/L (ref 98–111)
Creatinine, Ser: 0.58 mg/dL (ref 0.44–1.00)
GFR calc Af Amer: >60 mL/min (ref >60)
GFR calc non Af Amer: >60 mL/min (ref >60)
Glucose, Bld: 90 mg/dL (ref 70–99)
Potassium: 4 mmol/L (ref 3.5–5.1)
Sodium: 141 mmol/L (ref 135–145)

## 2018-11-02 LAB — CBC
HCT: 34.7 % — ABNORMAL LOW (ref 36.0–46.0)
Hemoglobin: 11.5 g/dL — ABNORMAL LOW (ref 12.0–15.0)
MCH: 30 pg (ref 26.0–34.0)
MCHC: 33.1 g/dL (ref 30.0–36.0)
MCV: 90.6 fL (ref 80.0–100.0)
Platelets: 303 10*3/uL (ref 150–400)
RBC: 3.83 MIL/uL — ABNORMAL LOW (ref 3.87–5.11)
RDW: 12.2 % (ref 11.5–15.5)
WBC: 13.9 10*3/uL — ABNORMAL HIGH (ref 4.0–10.5)
nRBC: 0 % (ref 0.0–0.2)

## 2018-11-02 LAB — GLUCOSE, CAPILLARY: Glucose-Capillary: 93 mg/dL (ref 70–99)

## 2018-11-02 MED ORDER — LOSARTAN POTASSIUM 25 MG PO TABS
25.0000 mg | ORAL_TABLET | Freq: Every day | ORAL | Status: DC
  Administered 2018-11-02: 25 mg via ORAL
  Filled 2018-11-02: qty 1

## 2018-11-02 MED ORDER — SENNOSIDES-DOCUSATE SODIUM 8.6-50 MG PO TABS: 1.0000 | ORAL_TABLET | Freq: Every evening | ORAL | Status: DC | PRN

## 2018-11-02 MED ORDER — BISACODYL 10 MG RE SUPP: 10.0000 mg | Freq: Every day | RECTAL | 0 refills | Status: DC | PRN

## 2018-11-02 MED ORDER — LOSARTAN POTASSIUM 25 MG PO TABS: 25.0000 mg | ORAL_TABLET | Freq: Every day | ORAL | 0 refills | Status: AC

## 2018-11-02 MED ORDER — LOSARTAN POTASSIUM 25 MG PO TABS: 25.0000 mg | ORAL_TABLET | Freq: Every day | ORAL | Status: DC

## 2018-11-02 NOTE — Progress Notes (Signed)
Physical Therapy Treatment Patient Details Name: Kristy Cabrera MRN: [id number] DOB: 09-25-1950 Today's Date: 11/02/2018    History of Present Illness Pt readmitted with near syncope at home after recent admission for lt CVA and lt CEA on 4/8. PMH - htn, seizure, meningioma, dementia    PT Comments    Pt to discharge home today so focus of treatment session, stair training. Pt is limited in safe mobility by decreased strength and increased fatigue with minimal mobility. Pt has 4 steps to enter home, to decrease fatigue rolled pt to stair well in recliner. Pt requires min A for bed mobility, transfers and ambulation with RW. Pt requires modA for ascent/descent of 1 step. Pt unable to complete 4 steps to simulate entrance into home. Pt educated to have husband utilize gait belt to assist up steps and to have w/c ready once she is at the top of the steps. PT continues to recommend SNF level care, once pt is in home she will need 24 hour care. Per notes pt's husband to research building ramp to insure pt can safely exit/enter home.    Follow Up Recommendations  SNF     Equipment Recommendations  None recommended by PT       Precautions / Restrictions Precautions Precautions: Fall Restrictions Weight Bearing Restrictions: No    Mobility  Bed Mobility Overal bed mobility: Needs Assistance Bed Mobility: Supine to Sit     Supine to sit: Min assist;HOB elevated     General bed mobility comments: Assist to elevate trunk into sitting. Incr time  Transfers Overall transfer level: Needs assistance Equipment used: Rolling walker (2 wheeled) Transfers: Sit to/from Stand Sit to Stand: Min assist         General transfer comment: minA for powerup and steady in RW  Ambulation/Gait Ambulation/Gait assistance: Min assist Gait Distance (Feet): 10 Feet Assistive device: Rolling walker (2 wheeled) Gait Pattern/deviations: Step-through pattern;Decreased step length - right;Decreased step  length - left;Shuffle;Trunk flexed Gait velocity: decr Gait velocity interpretation: <1.31 ft/sec, indicative of household ambulator General Gait Details: Assist for balance and support.   Stairs Stairs: Yes Stairs assistance: Mod assist Stair Management: One rail Right;Forwards;Backwards;Step to pattern Number of Stairs: 1 General stair comments: modA for powerup 1 step leading with strong R LE, modA with increased encouragement to step backwards down from step,        Balance Overall balance assessment: Needs assistance Sitting-balance support: No upper extremity supported;Feet supported Sitting balance-Leahy Scale: Good     Standing balance support: Bilateral upper extremity supported Standing balance-Leahy Scale: Poor Standing balance comment: relaint on B UE and external support                            Cognition Arousal/Alertness: Awake/alert Behavior During Therapy: Flat affect Overall Cognitive Status: No family/caregiver present to determine baseline cognitive functioning Area of Impairment: Memory;Following commands;Safety/judgement;Problem solving                     Memory: Decreased short-term memory Following Commands: Follows one step commands with increased time Safety/Judgement: Decreased awareness of deficits   Problem Solving: Slow processing;Decreased initiation General Comments: pt continues to require increased time for processing         General Comments General comments (skin integrity, edema, etc.): max HR with ascent/descent steps 128 bpm, pt visibly fatigued with stair training. Educated that husband will need to use gait belt to assist pt up 4  steps into her home with use of R handrail. Encourged her to have w/c ready to sit in once she climbs steps.      Pertinent Vitals/Pain Pain Assessment: 0-10 Faces Pain Scale: Hurts little more Pain Descriptors / Indicators: Grimacing;Discomfort Pain Intervention(s): Monitored  during session;Limited activity within patient's tolerance;Repositioned           PT Goals (current goals can now be found in the care plan section) Acute Rehab PT Goals Patient Stated Goal: not stated PT Goal Formulation: With patient Time For Goal Achievement: 11/14/18 Potential to Achieve Goals: Good Progress towards PT goals: Progressing toward goals    Frequency    Min 3X/week      PT Plan Discharge plan needs to be updated(pt to d/c home with husbands 24 hour care)       AM-PAC PT "6 Clicks" Mobility   Outcome Measure  Help needed turning from your back to your side while in a flat bed without using bedrails?: A Little Help needed moving from lying on your back to sitting on the side of a flat bed without using bedrails?: A Little Help needed moving to and from a bed to a chair (including a wheelchair)?: A Little Help needed standing up from a chair using your arms (e.g., wheelchair or bedside chair)?: A Little Help needed to walk in hospital room?: A Little Help needed climbing 3-5 steps with a railing? : A Lot 6 Click Score: 17    End of Session Equipment Utilized During Treatment: Gait belt Activity Tolerance: Patient limited by fatigue Patient left: in chair;with call bell/phone within reach;with chair alarm set Nurse Communication: Mobility status;Other (comment)(replace purewick) PT Visit Diagnosis: Unsteadiness on feet (R26.81);Other abnormalities of gait and mobility (R26.89);Muscle weakness (generalized) (M62.81)     Time: 9935-7017 PT Time Calculation (min) (ACUTE ONLY): 28 min  Charges:  $Gait Training: 23-37 mins                     Kristy Cabrera PT, DPT Acute Rehabilitation Services Pager [phone] Office [phone]    [place] 11/02/2018, 1:22 PM

## 2018-11-02 NOTE — Progress Notes (Signed)
IV and teli d/c.Paperwork ready. Patient awaiting on wheelchair to be delivered to the room.

## 2018-11-02 NOTE — Progress Notes (Signed)
Patient discharged: Home with family and HH  Via: Wheelchair   Discharge paperwork given: to patient and family (wheelchair delivered)  Reviewed with teach back  IV and telemetry disconnected  Belongings given to patient

## 2018-11-02 NOTE — TOC Initial Note (Addendum)
Transition of Care [facility]) - Initial/Assessment Note    Patient Details  Name: Kristy Cabrera MRN: [id number] Date of Birth: [birth year]  [facility] CM/SW Contact:    Candie Chroman, LCSW Phone Number: 11/02/2018, 10:04 AM  Clinical Narrative: CSW met with patient, introduced role, and explained that PT recommendations would be discussed. Patient agreeable to SNF placement. Provided CMS Medicare scores for facilities within 25 miles of her zip code. She pointed out [facility] and asked CSW to call her husband to discuss. Husband stated that [facility] is actually the first preference. Patient was there last month for 21 days. Notified him that if this is the case, she will have a $160 per day copay. Husband stated they are unable to afford this because they are on fixed income. CSW left voicemail for admissions coordinator to confirm dates and copay. If this is the case, he is agreeable to her returning home with home health through [facility]. MD is aware. She already has a walker and bedside commode at home so he stated she needs no other equipment at discharge. No further concerns. CSW encouraged patient and her husband to contact CSW as needed. CSW will continue to follow patient and her husband for support and facilitate discharge to home vs. SNF once SNF returns call.          10:44 am: Patient was at [facility] 3/7-3.23 (16 days) so she would have 3-4 days with just $10-$20 copay before $160 copay starts. Called and notified husband and he prefers just to return home. He is now requesting a wheelchair. Sent MD message requesting home health and wheelchair order. Husband asked about a ramp. CSW encouraged him to ask people he knows in that area because boy scouts or churches may provide that service.  11:11 am: Faxed requested documentation to [facility]. They have been active with patient since 3/24. Notified [employer] that wheelchair order is  in. They no longer offer standard manual wheelchairs, only the lightweight. Sent message to MD requesting order change.  Expected Discharge Plan: [place] Services Barriers to Discharge: Other (comment)(Waiting on SNF to confirm days.)   Patient Goals and CMS Choice Patient states their goals for this hospitalization and ongoing recovery are:: Patient unable to give answer. CMS Medicare.gov Compare Post Acute Care list provided to:: Patient    Expected Discharge Plan and Services Expected Discharge Plan: [place] Choice: [place] arrangements for the past 2 months: Single Family Home Expected Discharge Date: 11/02/18               DME Arranged: N/A(Already has a walker and bedside commode.) DME Agency: NA HH Arranged: PT, OT, RN, Nurse's Aide [place] Agency: [facility]  Prior Living Arrangements/Services Living arrangements for the past 2 months: [place] Lives with:: Spouse Patient language and need for interpreter reviewed:: No Do you feel safe going back to the place where you live?: Yes      Need for Family Participation in Patient Care: Yes (Comment) Care giver support system in place?: Yes (comment)(Husband) Current home services: DME Criminal Activity/Legal Involvement Pertinent to Current Situation/Hospitalization: No - Comment as needed  Activities of Daily Living Home Assistive Devices/Equipment: Gilford Rile (specify type) ADL Screening (condition at time of admission) Patient's cognitive ability adequate to safely complete daily activities?: Yes Is the patient deaf or have difficulty  hearing?: Yes Does the patient have difficulty seeing, even when wearing glasses/contacts?: No Does the patient have difficulty concentrating, remembering, or making decisions?: Yes Patient able to express need for assistance with ADLs?: Yes Does the patient have difficulty dressing  or bathing?: Yes Independently performs ADLs?: No Does the patient have difficulty walking or climbing stairs?: Yes Weakness of Legs: Both Weakness of Arms/Hands: Both  Permission Sought/Granted Permission sought to share information with : Facility Sport and exercise psychologist, Family Supports Permission granted to share information with : Yes, Verbal Permission Granted  Share Information with NAME: Virdell Hoiland  Permission granted to share info w AGENCY: [facility]  Permission granted to share info w Relationship: Husband  Permission granted to share info w Contact Information: [phone]  Emotional Assessment Appearance:: Appears stated age Attitude/Demeanor/Rapport: Lethargic Affect (typically observed): Accepting, Calm Orientation: : Oriented to Self, Oriented to Place, Oriented to  Time, Oriented to Situation Alcohol / Substance Use: Never Used Psych Involvement: No (comment)  Admission diagnosis:  Weakness [R53.1] Altered mental status, unspecified altered mental status type [R41.82] Syncope, unspecified syncope type [R55] Patient Active Problem List   Diagnosis Date Noted  . Subclinical hyperthyroidism 10/31/2018  . History of stroke 10/31/2018  . Leukocytosis 10/31/2018  . Syncope and collapse 10/30/2018  . Stenosis of internal carotid artery with cerebral infarction, left ([place]) 10/24/2018  . Seizure disorder ([place]) 10/24/2018  . COPD (chronic obstructive pulmonary disease) ([place]) 10/24/2018  . Syncope 10/24/2018  . Acute ischemic left MCA stroke ([place]) 10/24/2018  . Cognitive impairment 10/24/2018  . Confusion   . Acute hypokalemia   . Hydrochlorothiazide adverse reaction   . Hyponatremia 07/04/2016  . Acute hyponatremia   . Alcohol abuse   . Alcohol withdrawal syndrome with complication ([place])   . Acute encephalopathy    PCP:  [name], NP Pharmacy:   [facility], [place] [address] Phone:  [phone] Fax: [phone]     Social Determinants of Health (SDOH) Interventions    Readmission Risk Interventions Readmission Risk Prevention Plan 10/29/2018  Post Dischage Appt Complete  Medication Screening Complete  Transportation Screening Complete

## 2018-11-02 NOTE — Discharge Summary (Addendum)
Physician Discharge Summary  Kristy Cabrera LSL:[id number] DOB: 1951/02/16 DOA: 10/30/2018  PCP: [name], NP  Admit date: 10/30/2018 Discharge date: 11/02/2018  Admitted From: home Discharge disposition: SNF   Recommendations for Outpatient Follow-Up:   1. BMP 1 week re: Cr and K 2. Continue to titrate BP medications 3. Vascular follow up s/p surgery 4. TSH/free t 4 in 6 weeks   Discharge Diagnosis:   Principal Problem:   Syncope and collapse Active Problems:   Seizure disorder (HCC)   Cognitive impairment   Subclinical hyperthyroidism   History of stroke   Leukocytosis    Discharge Condition: Improved.  Diet recommendation: Low sodium, heart healthy  Wound care: None.  Code status: Full.   History of Present Illness:   Kristy Cabrera is an 68 y.o. female with past medical history significant for dementia, HTN, HLD, seizure disorder, osteopenia, and history of meningioma resected; who presents after patient got up from the table and took 1 step prior to collapsing.  Suspected syncope secondary to orthostatic hypotension/bradycardia   Hospital Course by Problem:   Syncope and collapse, suspected orthostatic hypotension/bradycardia:  -reported to have collapsed after getting up from the table.  No significant arrhythmias noted on telemetry overnight.   -Suspect orthostatic hypotension, but initial orthostatics not obtained until after patient had been started on IV fluids of normal saline.  - Physical therapy/Occupational Therapy worked with the patient and recommended skilled nursing facility placement/24-hour supervision.   Leukocytosis: White blood cell counts noted to be elevated up to 18.9 on admission.  Patient afebrile.  Suspect this could be likely reactive in nature.  -trending down -no sign of infection at site of surgery  Recent left MCA thrombotic stroke, s/p left carotid endarterectomy:  -Patient had just been discharged from the  hospital on 4/9 after suffering from a stroke.  Underwent left carotid endarterectomy on 4/8 with vascular surgery.  No clear signs of infection noted on physical exam of the patient's neck.  Vascular duplex Doppler ultrasound performed today did not show any acute abnormalities.   -Continue Plavix and statin  Subclinical hypothyroidism: TSH noted to be low at 0.175, but free T4 was within normal limits at 1.28. -Will need outpatient monitoring of thyroid levels in 4 to 6 weeks.  History of seizure disorder: No seizure activity noted per husband. -Continue Keppra  Essential hypertension: At home patient on amlodipine 10 mg daily and atenolol 25 mg daily for blood pressure control per pharmacy.  Discharge summary notes patient to be on 100 mg of atenolol. -Restart back amlodipine  -d/c atenolol- see H&P-- there was concern for bradycardia -start ARB-- monitor Cr and K in 1 week   Dementia/cognitive impairment: Suspect likely multifactorial given previous history of alcohol abuse, seizure disorder, and stroke -Continue Aricept and Namenda  COPD: Stable.  History of alcohol abuse:  - in remission  obesity Body mass index is 30.18 kg/m.    Medical Consultants:      Discharge Exam:   Vitals:   11/02/18 0434 11/02/18 0839  BP: (!) 133/54 (!) 158/62  Pulse: 61 75  Resp: 20   Temp: 99 F (37.2 C)   SpO2: 98% 98%   Vitals:   11/01/18 1700 11/01/18 1937 11/02/18 0434 11/02/18 0839  BP:  (!) 125/48 (!) 133/54 (!) 158/62  Pulse:  84 61 75  Resp:  (!) 24 20   Temp: 98 F (36.7 C) 98.6 F (37 C) 99 F (37.2 C)  TempSrc: Oral Oral Oral   SpO2:  97% 98% 98%  Weight:   72.2 kg   Height:        General exam: Appears calm and comfortable.   The results of significant diagnostics from this hospitalization (including imaging, microbiology, ancillary and laboratory) are listed below for reference.     Procedures and Diagnostic Studies:   Ct Head Wo Contrast   Result Date: 10/30/2018 CLINICAL DATA:  Altered level of consciousness. EXAM: CT HEAD WITHOUT CONTRAST TECHNIQUE: Contiguous axial images were obtained from the base of the skull through the vertex without intravenous contrast. COMPARISON:  CT 10/23/2018 FINDINGS: Brain: Stable bifrontal encephalomalacia. Old bilateral lacunar infarcts in the basal ganglia. No acute intracranial abnormality. Specifically, no hemorrhage, hydrocephalus, mass lesion, acute infarction, or significant intracranial injury. Vascular: No hyperdense vessel or unexpected calcification. Skull: No acute calvarial abnormality. Sinuses/Orbits: Visualized paranasal sinuses and mastoids clear. Orbital soft tissues unremarkable. Other: There is gas noted within the soft tissues in the left side of the lower face/neck posterior to the left mandible of unknown etiology. IMPRESSION: Stable bifrontal encephalomalacia. Stable old bilateral basal ganglia lacunar infarcts. No acute intracranial abnormality. Gas within the soft tissues of the lower face posterior to the left mandible of unknown etiology. Electronically Signed   By: Rolm Baptise M.D.   On: 10/30/2018 15:04   Dg Chest Port 1 View  Result Date: 10/30/2018 CLINICAL DATA:  Syncope EXAM: PORTABLE CHEST 1 VIEW COMPARISON:  10/23/2018 FINDINGS: Cardiac enlargement without heart failure. Atherosclerotic aortic arch and right carotid artery. Probable left carotid endarterectomy. Lungs clear without infiltrate effusion or mass. IMPRESSION: No active disease. Electronically Signed   By: Franchot Gallo M.D.   On: 10/30/2018 12:16   Vas US Carotid  Result Date: 10/31/2018 Carotid Arterial Duplex Study Indications: Syncope and Endarterectomy 10/28/2018. Performing Technologist: Maudry Mayhew MHA, RDMS, RVT, RDCS  Examination Guidelines: A complete evaluation includes B-mode imaging, spectral Doppler, color Doppler, and power Doppler as needed of all accessible portions of each vessel.  Bilateral testing is considered an integral part of a complete examination. Limited examinations for reoccurring indications may be performed as noted.  Right Carotid Findings: +----------+--------+-------+--------+--------------------------------+--------+           PSV cm/sEDV    StenosisDescribe                        Comments                   cm/s                                                    +----------+--------+-------+--------+--------------------------------+--------+ CCA Prox  86      11             smooth, heterogenous and                                                  calcific                                 +----------+--------+-------+--------+--------------------------------+--------+ CCA Distal70      10  smooth and heterogenous                  +----------+--------+-------+--------+--------------------------------+--------+ ICA Prox  78      20             smooth, heterogenous and                                                  calcific                                 +----------+--------+-------+--------+--------------------------------+--------+ ICA Distal172     38                                                      +----------+--------+-------+--------+--------------------------------+--------+ ECA       138     14                                                      +----------+--------+-------+--------+--------------------------------+--------+ +----------+--------+-------+----------------+-------------------+           PSV cm/sEDV cmsDescribe        Arm Pressure (mmHG) +----------+--------+-------+----------------+-------------------+ [id number]             Multiphasic, WNL                    +----------+--------+-------+----------------+-------------------+ +---------+--------+--+--------+--+---------+ VertebralPSV cm/s53EDV cm/s14Antegrade +---------+--------+--+--------+--+---------+   Left Carotid Findings: +----------+--------+--------+--------+--------------------------+--------+           PSV cm/sEDV cm/sStenosisDescribe                  Comments +----------+--------+--------+--------+--------------------------+--------+ CCA Prox  28      11                                                 +----------+--------+--------+--------+--------------------------+--------+ CCA Distal37      7                                                  +----------+--------+--------+--------+--------------------------+--------+ ICA Prox                          heterogenous and irregular         +----------+--------+--------+--------+--------------------------+--------+ ICA Mid   86      21                                                 +----------+--------+--------+--------+--------------------------+--------+ ICA Distal98      29                                                 +----------+--------+--------+--------+--------------------------+--------+  ECA       169     14                                                 +----------+--------+--------+--------+--------------------------+--------+ +----------+--------+--------+----------------+-------------------+ SubclavianPSV cm/sEDV cm/sDescribe        Arm Pressure (mmHG) +----------+--------+--------+----------------+-------------------+           116             Multiphasic, WNL                    +----------+--------+--------+----------------+-------------------+ +---------+--------+--------+--------------+ VertebralPSV cm/sEDV cm/sNot identified +---------+--------+--------+--------------+  Summary: Right Carotid: Velocities in the right ICA are consistent with a 1-39% stenosis. Left Carotid: Velocities in the left ICA are consistent with a 1-39% stenosis.               Left internal carotid artery endarterectomy site appears to have               intraluminal heterogenous echoes. Vertebrals:   Right vertebral artery demonstrates antegrade flow. Left vertebral              artery was not visualized. Subclavians: Normal flow hemodynamics were seen in bilateral subclavian              arteries. *See table(s) above for measurements and observations.     Preliminary      Labs:   Basic Metabolic Panel: Recent Labs  Lab 10/28/18 0332 10/29/18 0350 10/30/18 1137 10/30/18 1917 11/02/18 0532  NA 138 136 137  --  141  K 3.8 4.3 3.9  --  4.0  CL 111 107 102  --  105  CO2 22 21* 25  --  24  GLUCOSE 84 145* 135*  --  90  BUN 17 21 13   --  16  CREATININE 0.64 1.06* 0.79 0.66 0.58  CALCIUM 9.0 8.4* 9.3  --  9.6   GFR Estimated Creatinine Clearance: 61.2 mL/min (by C-G formula based on SCr of 0.58 mg/dL). Liver Function Tests: Recent Labs  Lab 10/30/18 1137  AST 24  ALT 15  ALKPHOS 127*  BILITOT 0.4  PROT 6.7  ALBUMIN 3.3*   No results for input(s): LIPASE, AMYLASE in the last 168 hours. No results for input(s): AMMONIA in the last 168 hours. Coagulation profile Recent Labs  Lab 10/30/18 1137  INR 1.0    CBC: Recent Labs  Lab 10/29/18 0350 10/30/18 1137 10/30/18 1917 11/01/18 0503 11/02/18 0532  WBC 15.9* 18.3* 17.3* 12.7* 13.9*  NEUTROABS  --  14.5*  --  8.3*  --   HGB 9.6* 11.2* 10.9* 9.7* 11.5*  HCT 29.3* 35.2* 33.1* 30.8* 34.7*  MCV 89.9 90.7 89.5 91.1 90.6  PLT 236 287 315 259 303   Cardiac Enzymes: No results for input(s): CKTOTAL, CKMB, CKMBINDEX, TROPONINI in the last 168 hours. BNP: Invalid input(s): POCBNP CBG: Recent Labs  Lab 10/30/18 1155 10/31/18 0453 11/01/18 0757 11/02/18 0533  GLUCAP 120* 86 129* 93   D-Dimer No results for input(s): DDIMER in the last 72 hours. Hgb A1c No results for input(s): HGBA1C in the last 72 hours. Lipid Profile No results for input(s): CHOL, HDL, LDLCALC, TRIG, CHOLHDL, LDLDIRECT in the last 72 hours. Thyroid function studies Recent Labs    10/30/18 1917 10/31/18 0738  TSH 0.175*  --   T3FREE  --  2.2   Anemia work up No results for input(s): VITAMINB12, FOLATE, FERRITIN, TIBC, IRON, RETICCTPCT in the last 72 hours. Microbiology Recent Results (from the past 240 hour(s))  Culture, Urine     Status: Abnormal   Collection Time: 10/26/18  3:13 AM  Result Value Ref Range Status   Specimen Description URINE, RANDOM  Final   Special Requests NONE  Final   Culture (A)  Final    <10,000 COLONIES/mL INSIGNIFICANT GROWTH Performed at [facility], [address] [place], [place] [address]    Report Status 10/27/2018 FINAL  Final  Surgical pcr screen     Status: None   Collection Time: 10/28/18  6:09 AM  Result Value Ref Range Status   MRSA, PCR NEGATIVE NEGATIVE Final   Staphylococcus aureus NEGATIVE NEGATIVE Final    Comment: (NOTE) The Xpert SA Assay (FDA approved for NASAL specimens in patients 33 years of age and older), is one component of a comprehensive surveillance program. It is not intended to diagnose infection nor to guide or monitor treatment. Performed at [facility], [place] [address] [place], [place] [address]   Urine culture     Status: Abnormal   Collection Time: 10/30/18  6:55 PM  Result Value Ref Range Status   Specimen Description URINE, RANDOM  Final   Special Requests NONE  Final   Culture (A)  Final    <10,000 COLONIES/mL INSIGNIFICANT GROWTH Performed at [facility], [place] [address] [place], [place] [address]    Report Status 11/01/2018 FINAL  Final     Discharge Instructions:   Discharge Instructions    Diet - low sodium heart healthy   Complete by:  As directed    Increase activity slowly   Complete by:  As directed      Allergies as of 11/02/2018   No Known Allergies     Medication List    STOP taking these medications   atenolol 25 MG tablet Commonly known as:  TENORMIN     TAKE these medications   amLODipine 10 MG tablet Commonly known as:  NORVASC Take 10 mg by mouth daily.   atorvastatin 80 MG tablet  Commonly known as:  LIPITOR Take 1 tablet (80 mg total) by mouth daily at 6 PM.   bisacodyl 10 MG suppository Commonly known as:  DULCOLAX Place 1 suppository (10 mg total) rectally daily as needed for moderate constipation.   calcium-vitamin D 500-200 MG-UNIT tablet Commonly known as:  OSCAL WITH D Take 1 tablet by mouth 2 (two) times daily.   clopidogrel 75 MG tablet Commonly known as:  Plavix Take 1 tablet (75 mg total) by mouth daily for 30 days.   donepezil 5 MG tablet Commonly known as:  ARICEPT Take 5 mg by mouth at bedtime.   levETIRAcetam 750 MG tablet Commonly known as:  KEPPRA Take 750 mg by mouth 2 (two) times daily.   losartan 25 MG tablet Commonly known as:  COZAAR Take 1 tablet (25 mg total) by mouth daily.   Melatonin 3 MG Caps Take 3 mg by mouth at bedtime.   memantine 10 MG tablet Commonly known as:  NAMENDA Take 10 mg by mouth daily.   senna-docusate 8.6-50 MG tablet Commonly known as:  Senokot-S Take 1 tablet by mouth at bedtime as needed for mild constipation.   SUPER B COMPLEX PO Take 1 tablet by mouth daily.         Time coordinating discharge: 25  min  Signed:  Geradine Girt DO  [facility] 11/02/2018, 8:51 AM

## 2018-11-02 NOTE — TOC Transition Note (Signed)
Transition of Care [facility]) - CM/SW Discharge Note   Patient Details  Name: Nance Mccombs MRN: [id number] Date of Birth: 14-Sep-1950  [facility]) CM/SW Contact:  Candie Chroman, LCSW Phone Number: 11/02/2018, 3:23 PM   Clinical Narrative: Wheelchair is at bedside.  RN will notify husband so he can come pick her up. CSW signing off.  Final next level of care: [place] Barriers to Discharge: Barriers Resolved   Patient Goals and CMS Choice Patient states their goals for this hospitalization and ongoing recovery are:: Patient unable to give answer. CMS Medicare.gov Compare Post Acute Care list provided to:: Patient    Discharge Placement                  Name of family member notified: Krina Mraz Patient and family notified of of transfer: 11/02/18  Discharge Plan and Services     Post Acute Care Choice: [place]          DME Arranged: Wheelchair manual DME Agency: [facility] HH Arranged: RN, PT, OT, Nurse's Aide [place] Agency: [facility]   Social Determinants of Health (SDOH) Interventions     Readmission Risk Interventions Readmission Risk Prevention Plan 10/29/2018  Post Dischage Appt Complete  Medication Screening Complete  Transportation Screening Complete

## 2018-11-02 NOTE — Telephone Encounter (Signed)
sch appt spk to pt husband 11/24/2018 1015am p/o MD

## 2018-11-02 NOTE — Consult Note (Signed)
   [facility] CM Inpatient Consult   11/02/2018  Kristy Cabrera 1951/02/12 [id number]   Patient evaluated for community based chronic disease management services with [place] [facility] as a benefit of patient's Medicare Insurance. Spoke with patient's husband [name] via telephone, [phone], HIPAA verified with patient's name, date of birth and house address verified to explain [place] Management services. He confirms that they will have [facility] for therapy, [employer] for her wheelchair needs.  He states he will have difficulty getting her in and out of the house and needed a ramp.  Explained the role of [place] Education officer, museum and he agreed.  Also, explained that the patient qualifies for complex disease management as a benefit.  He currently agrees but restates they will have home health also.  Patient will receive post hospital discharge call and will be evaluated for complex disease management.     Of note, [facility] Care Management services does not replace or interfere with any services that are arranged by inpatient case management or social work.  For additional questions or referrals please contact:    Natividad Brood, RN BSN [place] [facility] Liaison  [phone] business mobile phone Toll free office [phone]

## 2018-11-02 NOTE — Clinical Social Work Note (Signed)
Patient suffers from CVA which impairs their ability to perform daily activities like grooming in the home. A walker will not resolve issue with performing activities of daily living. A wheelchair will allow patient to safely perform daily activities. Patient is not able to propel themselves in the home using a standard weight wheelchair due to endurance. Patient can self propel in the lightweight wheelchair.  Will also need back cushion Accessories: elevating leg rests (ELRs), wheel locks, extensions and anti-tippers.  [place] Scrape, [place]

## 2018-11-02 NOTE — Telephone Encounter (Signed)
-----   Message from Gabriel Earing, [place] sent at 10/29/2018  4:44 PM EDT ----- S/p CEA.  F/u with Dr. Donnetta Hutching in 2-3 weeks.  Thx

## 2018-11-03 ENCOUNTER — Other Ambulatory Visit: Payer: Self-pay | Admitting: *Deleted

## 2018-11-03 NOTE — Patient Outreach (Signed)
[name]) Care Management  11/03/2018  Kristy Cabrera [birth year] [id number]   CSW was able to make initial phone outreach to pt's husband today. Pt's identity was confirmed by husband due to pt's confusion and recent CVA. Per husband, pt was released from hospital on yesterday back to home. He needed help from a neighbor to get her into the home and is now awaiting word from a church to confirm their plans to build a ramp.  He indicates he has been able to order groceries on line and gets RX's delivered. "I am gone for less than 30 minutes".  He also shared they have cameras and a security system installed that allows him to view and communicate with her if needed when he is gone.  Pt is anticipating HHRN to come today from [facility]; as well as plans for PT, OT and SLP.   Pt's husband states financially they cannot afford to hire help in the home; nor can they afford ALF. CSW discussed finances, long term care facility options, Medicaid and other services/support with husband. CSW encouraged him to talk with an Elder Training and development officer and/or apply for Medicaid (income is too high but may be able to have a deductible for long term care placement Medicaid).    Per husband, they have no children and no local family- "everyone is in [place]".  Pt has wheelchair, walker, BSC, built in shower bench and no other DME needs at this time.   Pt may be eligible for the grant-funded program [employer]- that helps by providing assistance with light housekeeping, laundry, errands and some personal care. Referral made and they will outreach pt/husband by phone due to the [place] stay at home orders. Hopefully they can assist in the near future.  CSW offered to call again tomorrow for updates on the plan for ramp to be provided through a local church as well as to discuss options further.     CSW will update [facility] RNCM to above who also has been consulted.    Kristy Cabrera, MSW, [place] Worker  [place] [phone]

## 2018-11-04 ENCOUNTER — Other Ambulatory Visit: Payer: Self-pay | Admitting: *Deleted

## 2018-11-04 NOTE — Patient Outreach (Addendum)
[place] [facility]) Care Management  11/04/2018  Kristy Cabrera 1951-03-10 [id number]  CSW spoke with pt's husband who was advised to expect a call from [employer] to complete screening for services;  providing assistance with light housekeeping, laundry, errands and some personal care.  Until the COVID restrictions are lifted they cannot do in home assessment.  He is still waiting to hear from the church about a ramp and states that if need be he can build it.   CSW offered support and will touch base next week for further updates.   Eduard Clos, MSW, [place] Worker  [place] [phone]

## 2018-11-05 ENCOUNTER — Encounter: Payer: Self-pay | Admitting: *Deleted

## 2018-11-05 ENCOUNTER — Other Ambulatory Visit: Payer: Self-pay

## 2018-11-05 NOTE — Patient Outreach (Addendum)
New referral: MD office does own Transition of care.  Placed call to patient who is not able to communicate via phone., spoke with husband who reports patient is " holding her own"  Reports he has a wheelchair and will have a ramp in 1 week.   Reviewed and explained reason for call. Husband reports they are doing well. Reports no medication concerns. Patient is working with [facility] health , RN, PT, OT.  Had PT today.  Husband reports that he has not made an appointment with primary MD yet because he is waiting for ramp.  PLAN: Husband denies nursing needs at this time, Offered to mail letter with my contact information and husband has agreed to call me back if needs arise.Will inform [facility] social worker that nursing is closed.  Tomasa Rand, RN, BSN, CEN [facility] [employer] [phone]

## 2018-11-12 ENCOUNTER — Other Ambulatory Visit: Payer: Self-pay | Admitting: *Deleted

## 2018-11-12 DIAGNOSIS — Z9181 History of falling: Secondary | ICD-10-CM | POA: Diagnosis not present

## 2018-11-12 DIAGNOSIS — Z48812 Encounter for surgical aftercare following surgery on the circulatory system: Secondary | ICD-10-CM | POA: Diagnosis not present

## 2018-11-12 DIAGNOSIS — Z87891 Personal history of nicotine dependence: Secondary | ICD-10-CM | POA: Diagnosis not present

## 2018-11-12 DIAGNOSIS — Z7982 Long term (current) use of aspirin: Secondary | ICD-10-CM | POA: Diagnosis not present

## 2018-11-12 DIAGNOSIS — H409 Unspecified glaucoma: Secondary | ICD-10-CM | POA: Diagnosis not present

## 2018-11-12 DIAGNOSIS — Z8673 Personal history of transient ischemic attack (TIA), and cerebral infarction without residual deficits: Secondary | ICD-10-CM | POA: Diagnosis not present

## 2018-11-12 DIAGNOSIS — G40909 Epilepsy, unspecified, not intractable, without status epilepticus: Secondary | ICD-10-CM | POA: Diagnosis not present

## 2018-11-12 DIAGNOSIS — J9610 Chronic respiratory failure, unspecified whether with hypoxia or hypercapnia: Secondary | ICD-10-CM | POA: Diagnosis not present

## 2018-11-12 DIAGNOSIS — I739 Peripheral vascular disease, unspecified: Secondary | ICD-10-CM | POA: Diagnosis not present

## 2018-11-12 DIAGNOSIS — F1010 Alcohol abuse, uncomplicated: Secondary | ICD-10-CM | POA: Diagnosis not present

## 2018-11-12 DIAGNOSIS — M810 Age-related osteoporosis without current pathological fracture: Secondary | ICD-10-CM | POA: Diagnosis not present

## 2018-11-12 DIAGNOSIS — Z86011 Personal history of benign neoplasm of the brain: Secondary | ICD-10-CM | POA: Diagnosis not present

## 2018-11-12 DIAGNOSIS — E785 Hyperlipidemia, unspecified: Secondary | ICD-10-CM | POA: Diagnosis not present

## 2018-11-12 DIAGNOSIS — I10 Essential (primary) hypertension: Secondary | ICD-10-CM | POA: Diagnosis not present

## 2018-11-12 DIAGNOSIS — G4700 Insomnia, unspecified: Secondary | ICD-10-CM | POA: Diagnosis not present

## 2018-11-12 DIAGNOSIS — M8580 Other specified disorders of bone density and structure, unspecified site: Secondary | ICD-10-CM | POA: Diagnosis not present

## 2018-11-12 DIAGNOSIS — F0390 Unspecified dementia without behavioral disturbance: Secondary | ICD-10-CM | POA: Diagnosis not present

## 2018-11-12 DIAGNOSIS — E7800 Pure hypercholesterolemia, unspecified: Secondary | ICD-10-CM | POA: Diagnosis not present

## 2018-11-12 NOTE — Patient Outreach (Signed)
[place] [facility]) Care Management  11/12/2018  Kristy Cabrera 26-Nov-1950 [id number]   CSW was unable to speak with pt today but did speak with her husband who reports; "she's napping". Per husband, they have been able to get a ramp in place and have home health support in place. He denies any concerns or needs.   CSW will plan a f/u call to pt next week for further screening of needs.    Eduard Clos, MSW, [place] Worker  [place] [phone]

## 2018-11-19 ENCOUNTER — Other Ambulatory Visit: Payer: Self-pay | Admitting: *Deleted

## 2018-11-19 NOTE — Patient Outreach (Signed)
[place] [facility]) Care Management  11/19/2018  Kristy Cabrera 09-23-1950 [id number]   CSW spoke with pt's husband who reports things are going "great". Therapy and home health support are in place and she is doing well. No additional needs identified and CSW advised husband of plans to sign off and reach out if additional needs arise.   Eduard Clos, MSW, [place] Worker  [place] [phone]

## 2018-11-20 ENCOUNTER — Other Ambulatory Visit: Payer: Self-pay

## 2018-11-20 NOTE — Patient Outreach (Signed)
Chart entry:  Nursing was closed on 11/05/2018.  Name was added back to care team for unknown reason.  Reviewed case and [facility] social worker is now closed. Will close case management date.   PLAN: will send MD and patient case closure letters.  Tomasa Rand, RN, BSN, CEN [facility] [employer] [phone]

## 2018-11-24 ENCOUNTER — Telehealth: Payer: Self-pay | Admitting: Vascular Surgery

## 2018-11-24 ENCOUNTER — Ambulatory Visit: Payer: PPO | Admitting: Vascular Surgery

## 2018-11-24 ENCOUNTER — Other Ambulatory Visit: Payer: Self-pay

## 2018-11-24 ENCOUNTER — Encounter: Payer: Self-pay | Admitting: Vascular Surgery

## 2018-11-24 NOTE — Telephone Encounter (Signed)
I spoke with the patient's husband by telephone today.  She has severe a aphasia related to her preoperative stroke.  He reports that her neck incision is healing quite nicely and she is having no new neurologic deficits.  They were offered a outpatient visit in our office today but the patient's husband deferred this with concern regarding COVID virus.  They will notify should she develop any wound issues.  Otherwise she will see [place] again in 9 months with repeat carotid duplex evaluation

## 2018-12-02 DIAGNOSIS — J449 Chronic obstructive pulmonary disease, unspecified: Secondary | ICD-10-CM | POA: Diagnosis not present

## 2018-12-07 ENCOUNTER — Telehealth: Payer: Self-pay | Admitting: Diagnostic Neuroimaging

## 2018-12-07 ENCOUNTER — Encounter: Payer: Self-pay | Admitting: Diagnostic Neuroimaging

## 2018-12-07 NOTE — Telephone Encounter (Signed)
pts husband called in and provided consent for doxy.me visit and to bill insurance  [phone]  [place]

## 2018-12-07 NOTE — Telephone Encounter (Signed)
Spoke with husband, [name] on [place] and updated patient's EMR.

## 2018-12-08 ENCOUNTER — Other Ambulatory Visit: Payer: Self-pay

## 2018-12-08 ENCOUNTER — Institutional Professional Consult (permissible substitution): Payer: PPO | Admitting: Diagnostic Neuroimaging

## 2018-12-08 NOTE — Telephone Encounter (Signed)
Called husband to discuss rescheduling video visit. He stated when he opened link he typed in her name, was asked to sign agreement, which he did. He stated it never let him go further than that. I confirmed e mail and advised we reschedule for tomorrow. We did, and I sent him new e mail.  I explained what he should see and do when he opens link. He  verbalized understanding, appreciation.

## 2018-12-09 ENCOUNTER — Other Ambulatory Visit: Payer: Self-pay

## 2018-12-09 ENCOUNTER — Institutional Professional Consult (permissible substitution) (INDEPENDENT_AMBULATORY_CARE_PROVIDER_SITE_OTHER): Payer: PPO | Admitting: Diagnostic Neuroimaging

## 2018-12-09 DIAGNOSIS — Z0289 Encounter for other administrative examinations: Secondary | ICD-10-CM

## 2018-12-09 NOTE — Telephone Encounter (Addendum)
Per Dr Leta Baptist patient could not connect with Cecil Cranker. She may follow up with PCP and if needed, they can refer back to [place] if needed. I called husband who stated they were waiting in doxy for 45 minutes then were disconnected. He expressed displeasure in having to wait that long then not ever connecting.   I apologized to him, advised we could reschedule for Aug when she can come in office. He stated they would go to her PCP. I advised him we will be glad to see her as needed. He  verbalized understanding.

## 2018-12-15 ENCOUNTER — Institutional Professional Consult (permissible substitution): Payer: PPO | Admitting: Diagnostic Neuroimaging

## 2019-01-02 DIAGNOSIS — J449 Chronic obstructive pulmonary disease, unspecified: Secondary | ICD-10-CM | POA: Diagnosis not present

## 2019-01-12 ENCOUNTER — Institutional Professional Consult (permissible substitution): Payer: PPO | Admitting: Diagnostic Neuroimaging

## 2019-02-01 DIAGNOSIS — J449 Chronic obstructive pulmonary disease, unspecified: Secondary | ICD-10-CM | POA: Diagnosis not present

## 2019-02-16 ENCOUNTER — Ambulatory Visit (INDEPENDENT_AMBULATORY_CARE_PROVIDER_SITE_OTHER): Payer: Self-pay | Admitting: Vascular Surgery

## 2019-02-16 ENCOUNTER — Other Ambulatory Visit: Payer: Self-pay

## 2019-02-16 ENCOUNTER — Encounter: Payer: Self-pay | Admitting: Vascular Surgery

## 2019-02-16 DIAGNOSIS — I6522 Occlusion and stenosis of left carotid artery: Secondary | ICD-10-CM

## 2019-02-16 NOTE — Progress Notes (Signed)
    Virtual Visit via Telephone Note   I connected with Kristy Cabrera on 02/16/2019 using the [employer] by telephone and verified that I was speaking with the correct person using two identifiers. Patient was located at home and accompanied by husband. I am located at [facility].   The limitations of evaluation and management by telemedicine and the availability of in person appointments have been previously discussed with the patient and are documented in the patients chart. The patient expressed understanding and consented to proceed.  PCP: [name], NP   Chief Complaint: Follow-up left carotid endarterectomy  History of Present Illness: Kristy Cabrera is a 68 y.o. female with telephone discussion today.  She had underwent left carotid endarterectomy for symptomatic carotid disease.  Had presented with a aphasia.  Spoke with her husband today due to her a difficulty with speech.  He reports that she is doing quite well.  Neck incision is completely healed and she is having improvement in her aphasia.  More understandable although she is speaking softly  Past Medical History:  Diagnosis Date  . Brain tumor (benign) ([employer])   . Dementia ([place])   . Episodic mood disorder ([place])   . Hyperlipidemia   . Hypertension   . Insomnia   . Osteopenia   . Weakness of both legs     Past Surgical History:  Procedure Laterality Date  . Brain tumor removal    . ENDARTERECTOMY Left 10/28/2018   Procedure: Left Carotid ENDARTERECTOMY;  Surgeon: [name], MD;  Location: [place];  Service: Vascular;  Laterality: Left;  Marland Kitchen MELANOMA EXCISION  1987   BACK    Current Meds  Medication Sig  . amLODipine (NORVASC) 10 MG tablet Take 10 mg by mouth daily.  Marland Kitchen atorvastatin (LIPITOR) 80 MG tablet Take 1 tablet (80 mg total) by mouth daily at 6 PM.  . B Complex-C (SUPER B COMPLEX PO) Take 1 tablet by mouth daily.  . calcium-vitamin D (OSCAL WITH D) 500-200 MG-UNIT tablet Take 1 tablet by mouth 2 (two)  times daily.  . clopidogrel (PLAVIX) 75 MG tablet Take 75 mg by mouth daily.  Marland Kitchen donepezil (ARICEPT) 5 MG tablet Take 5 mg by mouth at bedtime.  . levETIRAcetam (KEPPRA) 750 MG tablet Take 750 mg by mouth 2 (two) times daily.  Marland Kitchen losartan (COZAAR) 25 MG tablet Take 1 tablet (25 mg total) by mouth daily.  . Melatonin 3 MG CAPS Take 3 mg by mouth at bedtime.  . memantine (NAMENDA) 10 MG tablet Take 10 mg by mouth daily.     12 system ROS was negative unless otherwise noted in HPI   Observations/Objective: By husband's report doing well  Assessment and Plan: Plan office visit with carotid duplex in 6 months  Follow Up Instructions:       I discussed the assessment and treatment plan with the patient. The patient was provided an opportunity to ask questions and all were answered. The patient agreed with the plan and demonstrated an understanding of the instructions.   The patient was advised to call back or seek an in-person evaluation if the symptoms worsen or if the condition fails to improve as anticipated.  I spent 3 minutes with the patient via telephone encounter.   Annamary Rummage [facility] Office: [phone]  02/16/2019, 1:37 PM

## 2019-02-18 DIAGNOSIS — I10 Essential (primary) hypertension: Secondary | ICD-10-CM | POA: Diagnosis not present

## 2019-02-18 DIAGNOSIS — I6990 Unspecified sequelae of unspecified cerebrovascular disease: Secondary | ICD-10-CM | POA: Diagnosis not present

## 2019-02-18 DIAGNOSIS — Z79899 Other long term (current) drug therapy: Secondary | ICD-10-CM | POA: Diagnosis not present

## 2019-02-18 DIAGNOSIS — E7800 Pure hypercholesterolemia, unspecified: Secondary | ICD-10-CM | POA: Diagnosis not present

## 2019-02-18 DIAGNOSIS — D420 Neoplasm of uncertain behavior of cerebral meninges: Secondary | ICD-10-CM | POA: Diagnosis not present

## 2019-02-18 DIAGNOSIS — G40909 Epilepsy, unspecified, not intractable, without status epilepticus: Secondary | ICD-10-CM | POA: Diagnosis not present

## 2019-02-18 DIAGNOSIS — R413 Other amnesia: Secondary | ICD-10-CM | POA: Diagnosis not present

## 2019-02-18 DIAGNOSIS — Z87891 Personal history of nicotine dependence: Secondary | ICD-10-CM | POA: Diagnosis not present

## 2019-03-04 DIAGNOSIS — J449 Chronic obstructive pulmonary disease, unspecified: Secondary | ICD-10-CM | POA: Diagnosis not present

## 2019-04-04 DIAGNOSIS — J449 Chronic obstructive pulmonary disease, unspecified: Secondary | ICD-10-CM | POA: Diagnosis not present

## 2019-05-04 DIAGNOSIS — J449 Chronic obstructive pulmonary disease, unspecified: Secondary | ICD-10-CM | POA: Diagnosis not present

## 2019-06-04 DIAGNOSIS — J449 Chronic obstructive pulmonary disease, unspecified: Secondary | ICD-10-CM | POA: Diagnosis not present

## 2019-07-22 IMAGING — DX PORTABLE CHEST - 1 VIEW
1 series · 1 of 1 positions shown · non-contrast
Comparison: 10/23/2018

CLINICAL DATA: Syncope

EXAM:
PORTABLE CHEST 1 VIEW

[chest ap]
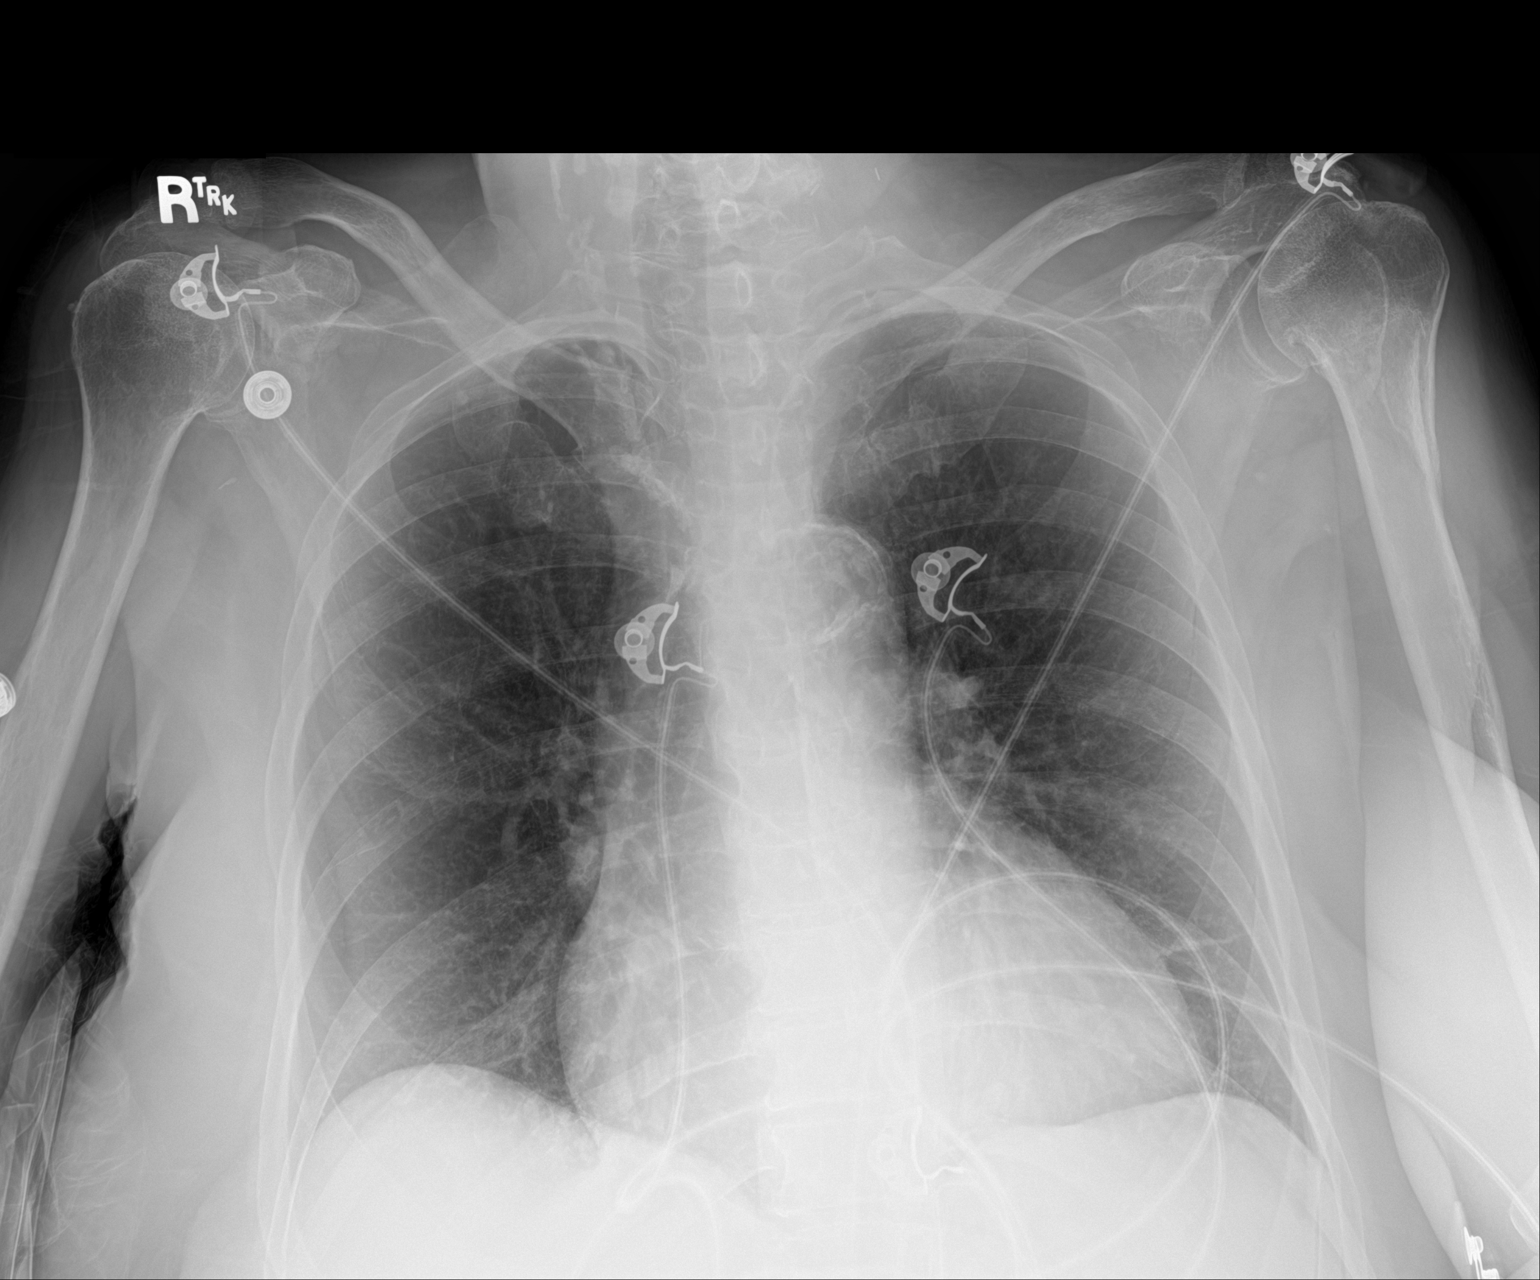

[1 of 1 positions shown; findings below may reference images not displayed]

FINDINGS: Cardiac enlargement without heart failure. Atherosclerotic aortic
arch and right carotid artery. Probable left carotid
endarterectomy. Lungs clear without infiltrate effusion or mass.
IMPRESSION: No active disease.

## 2019-09-08 DIAGNOSIS — J449 Chronic obstructive pulmonary disease, unspecified: Secondary | ICD-10-CM | POA: Diagnosis not present

## 2019-10-06 DIAGNOSIS — J449 Chronic obstructive pulmonary disease, unspecified: Secondary | ICD-10-CM | POA: Diagnosis not present

## 2019-10-18 DIAGNOSIS — B9689 Other specified bacterial agents as the cause of diseases classified elsewhere: Secondary | ICD-10-CM | POA: Diagnosis not present

## 2019-10-18 DIAGNOSIS — N3001 Acute cystitis with hematuria: Secondary | ICD-10-CM | POA: Diagnosis not present

## 2019-10-18 DIAGNOSIS — R4020 Unspecified coma: Secondary | ICD-10-CM | POA: Diagnosis not present

## 2019-10-18 DIAGNOSIS — Z79899 Other long term (current) drug therapy: Secondary | ICD-10-CM | POA: Diagnosis not present

## 2019-10-18 DIAGNOSIS — I6503 Occlusion and stenosis of bilateral vertebral arteries: Secondary | ICD-10-CM | POA: Diagnosis not present

## 2019-10-18 DIAGNOSIS — Z7902 Long term (current) use of antithrombotics/antiplatelets: Secondary | ICD-10-CM | POA: Diagnosis not present

## 2019-10-18 DIAGNOSIS — I959 Hypotension, unspecified: Secondary | ICD-10-CM | POA: Diagnosis not present

## 2019-10-18 DIAGNOSIS — R55 Syncope and collapse: Secondary | ICD-10-CM | POA: Diagnosis not present

## 2019-10-18 DIAGNOSIS — I6523 Occlusion and stenosis of bilateral carotid arteries: Secondary | ICD-10-CM | POA: Diagnosis not present

## 2019-11-06 DIAGNOSIS — J449 Chronic obstructive pulmonary disease, unspecified: Secondary | ICD-10-CM | POA: Diagnosis not present

## 2019-12-06 DIAGNOSIS — J449 Chronic obstructive pulmonary disease, unspecified: Secondary | ICD-10-CM | POA: Diagnosis not present

## 2020-01-06 DIAGNOSIS — J449 Chronic obstructive pulmonary disease, unspecified: Secondary | ICD-10-CM | POA: Diagnosis not present

## 2020-10-01 ENCOUNTER — Inpatient Hospital Stay (HOSPITAL_COMMUNITY)

## 2020-10-01 ENCOUNTER — Other Ambulatory Visit: Payer: Self-pay

## 2020-10-01 ENCOUNTER — Emergency Department (HOSPITAL_COMMUNITY)

## 2020-10-01 ENCOUNTER — Inpatient Hospital Stay (HOSPITAL_COMMUNITY)
Admission: EM | Admit: 2020-10-01 | Discharge: 2020-11-10 | DRG: 065 | Disposition: A | Discharge status: Skilled Nursing Facility | Attending: Internal Medicine | Admitting: Internal Medicine

## 2020-10-01 DIAGNOSIS — E785 Hyperlipidemia, unspecified: Secondary | ICD-10-CM | POA: Diagnosis present

## 2020-10-01 DIAGNOSIS — J449 Chronic obstructive pulmonary disease, unspecified: Secondary | ICD-10-CM | POA: Diagnosis present

## 2020-10-01 DIAGNOSIS — I672 Cerebral atherosclerosis: Secondary | ICD-10-CM | POA: Diagnosis present

## 2020-10-01 DIAGNOSIS — Z743 Need for continuous supervision: Secondary | ICD-10-CM | POA: Diagnosis not present

## 2020-10-01 DIAGNOSIS — Z515 Encounter for palliative care: Secondary | ICD-10-CM | POA: Diagnosis not present

## 2020-10-01 DIAGNOSIS — R4701 Aphasia: Secondary | ICD-10-CM | POA: Diagnosis present

## 2020-10-01 DIAGNOSIS — Z66 Do not resuscitate: Secondary | ICD-10-CM

## 2020-10-01 DIAGNOSIS — R29810 Facial weakness: Secondary | ICD-10-CM | POA: Diagnosis present

## 2020-10-01 DIAGNOSIS — E440 Moderate protein-calorie malnutrition: Secondary | ICD-10-CM | POA: Diagnosis present

## 2020-10-01 DIAGNOSIS — G459 Transient cerebral ischemic attack, unspecified: Secondary | ICD-10-CM | POA: Diagnosis not present

## 2020-10-01 DIAGNOSIS — F329 Major depressive disorder, single episode, unspecified: Secondary | ICD-10-CM | POA: Diagnosis present

## 2020-10-01 DIAGNOSIS — Z86011 Personal history of benign neoplasm of the brain: Secondary | ICD-10-CM

## 2020-10-01 DIAGNOSIS — Z789 Other specified health status: Secondary | ICD-10-CM

## 2020-10-01 DIAGNOSIS — G8194 Hemiplegia, unspecified affecting left nondominant side: Secondary | ICD-10-CM | POA: Diagnosis present

## 2020-10-01 DIAGNOSIS — Z7902 Long term (current) use of antithrombotics/antiplatelets: Secondary | ICD-10-CM

## 2020-10-01 DIAGNOSIS — Z6824 Body mass index (BMI) 24.0-24.9, adult: Secondary | ICD-10-CM

## 2020-10-01 DIAGNOSIS — B9620 Unspecified Escherichia coli [E. coli] as the cause of diseases classified elsewhere: Secondary | ICD-10-CM | POA: Diagnosis not present

## 2020-10-01 DIAGNOSIS — G9389 Other specified disorders of brain: Secondary | ICD-10-CM | POA: Diagnosis not present

## 2020-10-01 DIAGNOSIS — I63511 Cerebral infarction due to unspecified occlusion or stenosis of right middle cerebral artery: Principal | ICD-10-CM | POA: Diagnosis present

## 2020-10-01 DIAGNOSIS — F0391 Unspecified dementia with behavioral disturbance: Secondary | ICD-10-CM | POA: Diagnosis not present

## 2020-10-01 DIAGNOSIS — R29710 NIHSS score 10: Secondary | ICD-10-CM | POA: Diagnosis present

## 2020-10-01 DIAGNOSIS — N390 Urinary tract infection, site not specified: Secondary | ICD-10-CM | POA: Diagnosis not present

## 2020-10-01 DIAGNOSIS — R29898 Other symptoms and signs involving the musculoskeletal system: Secondary | ICD-10-CM | POA: Diagnosis not present

## 2020-10-01 DIAGNOSIS — Z808 Family history of malignant neoplasm of other organs or systems: Secondary | ICD-10-CM

## 2020-10-01 DIAGNOSIS — I639 Cerebral infarction, unspecified: Secondary | ICD-10-CM | POA: Diagnosis not present

## 2020-10-01 DIAGNOSIS — Z28310 Unvaccinated for covid-19: Secondary | ICD-10-CM | POA: Diagnosis not present

## 2020-10-01 DIAGNOSIS — I10 Essential (primary) hypertension: Secondary | ICD-10-CM | POA: Diagnosis not present

## 2020-10-01 DIAGNOSIS — Z23 Encounter for immunization: Secondary | ICD-10-CM | POA: Diagnosis not present

## 2020-10-01 DIAGNOSIS — R471 Dysarthria and anarthria: Secondary | ICD-10-CM | POA: Diagnosis present

## 2020-10-01 DIAGNOSIS — I6389 Other cerebral infarction: Secondary | ICD-10-CM | POA: Diagnosis not present

## 2020-10-01 DIAGNOSIS — Z8261 Family history of arthritis: Secondary | ICD-10-CM

## 2020-10-01 DIAGNOSIS — M8580 Other specified disorders of bone density and structure, unspecified site: Secondary | ICD-10-CM | POA: Diagnosis present

## 2020-10-01 DIAGNOSIS — Z7982 Long term (current) use of aspirin: Secondary | ICD-10-CM

## 2020-10-01 DIAGNOSIS — R1314 Dysphagia, pharyngoesophageal phase: Secondary | ICD-10-CM | POA: Diagnosis present

## 2020-10-01 DIAGNOSIS — R29818 Other symptoms and signs involving the nervous system: Secondary | ICD-10-CM | POA: Diagnosis not present

## 2020-10-01 DIAGNOSIS — Z7189 Other specified counseling: Secondary | ICD-10-CM | POA: Diagnosis not present

## 2020-10-01 DIAGNOSIS — F0390 Unspecified dementia without behavioral disturbance: Secondary | ICD-10-CM | POA: Diagnosis present

## 2020-10-01 DIAGNOSIS — Z20822 Contact with and (suspected) exposure to covid-19: Secondary | ICD-10-CM | POA: Diagnosis present

## 2020-10-01 DIAGNOSIS — Z751 Person awaiting admission to adequate facility elsewhere: Secondary | ICD-10-CM

## 2020-10-01 DIAGNOSIS — Z87891 Personal history of nicotine dependence: Secondary | ICD-10-CM

## 2020-10-01 DIAGNOSIS — R1310 Dysphagia, unspecified: Secondary | ICD-10-CM | POA: Diagnosis not present

## 2020-10-01 DIAGNOSIS — Z79899 Other long term (current) drug therapy: Secondary | ICD-10-CM

## 2020-10-01 DIAGNOSIS — Z8249 Family history of ischemic heart disease and other diseases of the circulatory system: Secondary | ICD-10-CM

## 2020-10-01 DIAGNOSIS — R8290 Unspecified abnormal findings in urine: Secondary | ICD-10-CM | POA: Diagnosis not present

## 2020-10-01 DIAGNOSIS — R279 Unspecified lack of coordination: Secondary | ICD-10-CM | POA: Diagnosis not present

## 2020-10-01 DIAGNOSIS — R21 Rash and other nonspecific skin eruption: Secondary | ICD-10-CM | POA: Diagnosis not present

## 2020-10-01 DIAGNOSIS — R278 Other lack of coordination: Secondary | ICD-10-CM | POA: Diagnosis present

## 2020-10-01 DIAGNOSIS — R5381 Other malaise: Secondary | ICD-10-CM

## 2020-10-01 DIAGNOSIS — Z833 Family history of diabetes mellitus: Secondary | ICD-10-CM

## 2020-10-01 DIAGNOSIS — G40909 Epilepsy, unspecified, not intractable, without status epilepticus: Secondary | ICD-10-CM | POA: Diagnosis present

## 2020-10-01 DIAGNOSIS — Z803 Family history of malignant neoplasm of breast: Secondary | ICD-10-CM

## 2020-10-01 DIAGNOSIS — R059 Cough, unspecified: Secondary | ICD-10-CM | POA: Diagnosis not present

## 2020-10-01 DIAGNOSIS — R0902 Hypoxemia: Secondary | ICD-10-CM | POA: Diagnosis not present

## 2020-10-01 DIAGNOSIS — R4781 Slurred speech: Secondary | ICD-10-CM | POA: Diagnosis not present

## 2020-10-01 DIAGNOSIS — J9811 Atelectasis: Secondary | ICD-10-CM | POA: Diagnosis not present

## 2020-10-01 DIAGNOSIS — Z841 Family history of disorders of kidney and ureter: Secondary | ICD-10-CM

## 2020-10-01 DIAGNOSIS — Z8679 Personal history of other diseases of the circulatory system: Secondary | ICD-10-CM | POA: Diagnosis not present

## 2020-10-01 LAB — DIFFERENTIAL
Abs Immature Granulocytes: 0.04 10*3/uL (ref 0.00–0.07)
Basophils Absolute: 0.1 10*3/uL (ref 0.0–0.1)
Basophils Relative: 1 %
Eosinophils Absolute: 0.2 10*3/uL (ref 0.0–0.5)
Eosinophils Relative: 1 %
Immature Granulocytes: 0 %
Lymphocytes Relative: 24 %
Lymphs Abs: 2.9 10*3/uL (ref 0.7–4.0)
Monocytes Absolute: 0.8 10*3/uL (ref 0.1–1.0)
Monocytes Relative: 7 %
Neutro Abs: 8 10*3/uL — ABNORMAL HIGH (ref 1.7–7.7)
Neutrophils Relative %: 67 %

## 2020-10-01 LAB — CBC
HCT: 46.4 % — ABNORMAL HIGH (ref 36.0–46.0)
Hemoglobin: 15.3 g/dL — ABNORMAL HIGH (ref 12.0–15.0)
MCH: 30.3 pg (ref 26.0–34.0)
MCHC: 33 g/dL (ref 30.0–36.0)
MCV: 91.9 fL (ref 80.0–100.0)
Platelets: 259 10*3/uL (ref 150–400)
RBC: 5.05 MIL/uL (ref 3.87–5.11)
RDW: 13.1 % (ref 11.5–15.5)
WBC: 11.9 10*3/uL — ABNORMAL HIGH (ref 4.0–10.5)
nRBC: 0 % (ref 0.0–0.2)

## 2020-10-01 LAB — APTT: aPTT: 20 seconds — ABNORMAL LOW (ref 24–36)

## 2020-10-01 LAB — COMPREHENSIVE METABOLIC PANEL
ALT: 15 U/L (ref 0–44)
AST: 23 U/L (ref 15–41)
Albumin: 4.1 g/dL (ref 3.5–5.0)
Alkaline Phosphatase: 102 U/L (ref 38–126)
Anion gap: 12 (ref 5–15)
BUN: 17 mg/dL (ref 8–23)
CO2: 21 mmol/L — ABNORMAL LOW (ref 22–32)
Calcium: 9.7 mg/dL (ref 8.9–10.3)
Chloride: 104 mmol/L (ref 98–111)
Creatinine, Ser: 0.86 mg/dL (ref 0.44–1.00)
GFR, Estimated: >60 mL/min (ref >60)
Glucose, Bld: 124 mg/dL — ABNORMAL HIGH (ref 70–99)
Potassium: 3.5 mmol/L (ref 3.5–5.1)
Sodium: 137 mmol/L (ref 135–145)
Total Bilirubin: 0.8 mg/dL (ref 0.3–1.2)
Total Protein: 8 g/dL (ref 6.5–8.1)

## 2020-10-01 LAB — PROTIME-INR
INR: 0.9 (ref 0.8–1.2)
Prothrombin Time: 12.2 seconds (ref 11.4–15.2)

## 2020-10-01 LAB — SARS CORONAVIRUS 2 (TAT 6-24 HRS): SARS Coronavirus 2: NEGATIVE

## 2020-10-01 LAB — CBG MONITORING, ED: Glucose-Capillary: 117 mg/dL — ABNORMAL HIGH (ref 70–99)

## 2020-10-01 MED ORDER — DONEPEZIL HCL 10 MG PO TABS
5.0000 mg | ORAL_TABLET | Freq: Every day | ORAL | Status: DC
  Administered 2020-10-02 – 2020-11-09 (×39): 5 mg via ORAL
  Filled 2020-10-01 (×39): qty 1

## 2020-10-01 MED ORDER — LEVETIRACETAM IN NACL 1000 MG/100ML IV SOLN
1000.0000 mg | Freq: Once | INTRAVENOUS | Status: AC
  Administered 2020-10-01: 1000 mg via INTRAVENOUS

## 2020-10-01 MED ORDER — ACETAMINOPHEN 160 MG/5ML PO SOLN: 650.0000 mg | ORAL | Status: DC | PRN

## 2020-10-01 MED ORDER — SODIUM CHLORIDE 0.9 % IV SOLN: INTRAVENOUS | Status: DC

## 2020-10-01 MED ORDER — DONEPEZIL HCL 10 MG PO TABS
5.0000 mg | ORAL_TABLET | Freq: Every day | ORAL | Status: DC
  Filled 2020-10-01: qty 1

## 2020-10-01 MED ORDER — ACETAMINOPHEN 650 MG RE SUPP: 650.0000 mg | RECTAL | Status: DC | PRN

## 2020-10-01 MED ORDER — SODIUM CHLORIDE 0.9 % IV BOLUS
1000.0000 mL | Freq: Once | INTRAVENOUS | Status: AC
  Administered 2020-10-01: 1000 mL via INTRAVENOUS

## 2020-10-01 MED ORDER — ATORVASTATIN CALCIUM 80 MG PO TABS
80.0000 mg | ORAL_TABLET | Freq: Every day | ORAL | Status: DC
  Administered 2020-10-02 – 2020-11-07 (×37): 80 mg via ORAL
  Filled 2020-10-01 (×37): qty 1

## 2020-10-01 MED ORDER — STROKE: EARLY STAGES OF RECOVERY BOOK
Freq: Once | Status: AC
  Filled 2020-10-01: qty 1

## 2020-10-01 MED ORDER — ASPIRIN EC 81 MG PO TBEC: 81.0000 mg | DELAYED_RELEASE_TABLET | Freq: Every day | ORAL | Status: DC

## 2020-10-01 MED ORDER — LEVETIRACETAM 750 MG PO TABS
750.0000 mg | ORAL_TABLET | Freq: Two times a day (BID) | ORAL | Status: DC
Start: 2020-10-02 — End: 2020-10-01

## 2020-10-01 MED ORDER — HYDRALAZINE HCL 20 MG/ML IJ SOLN: 2.0000 mg | Freq: Four times a day (QID) | INTRAMUSCULAR | Status: DC | PRN

## 2020-10-01 MED ORDER — SODIUM CHLORIDE 0.9 % IV SOLN
750.0000 mg | Freq: Two times a day (BID) | INTRAVENOUS | Status: DC
  Administered 2020-10-01 – 2020-10-03 (×4): 750 mg via INTRAVENOUS
  Filled 2020-10-01 (×5): qty 7.5

## 2020-10-01 MED ORDER — SENNOSIDES-DOCUSATE SODIUM 8.6-50 MG PO TABS
1.0000 | ORAL_TABLET | Freq: Every evening | ORAL | Status: DC | PRN
  Administered 2020-10-27: 1 via ORAL
  Filled 2020-10-01: qty 1

## 2020-10-01 MED ORDER — IOHEXOL 350 MG/ML SOLN
75.0000 mL | Freq: Once | INTRAVENOUS | Status: AC | PRN
  Administered 2020-10-01: 75 mL via INTRAVENOUS

## 2020-10-01 MED ORDER — LEVETIRACETAM 500 MG PO TABS
1000.0000 mg | ORAL_TABLET | Freq: Two times a day (BID) | ORAL | Status: DC
Start: 2020-10-01 — End: 2020-10-01

## 2020-10-01 MED ORDER — SODIUM CHLORIDE 0.9% FLUSH
3.0000 mL | Freq: Once | INTRAVENOUS | Status: DC
Start: 2020-10-01 — End: 2020-11-10

## 2020-10-01 MED ORDER — MELATONIN 3 MG PO TABS
3.0000 mg | ORAL_TABLET | Freq: Every day | ORAL | Status: DC
  Administered 2020-10-02 – 2020-11-09 (×38): 3 mg via ORAL
  Filled 2020-10-01 (×40): qty 1

## 2020-10-01 MED ORDER — ENOXAPARIN SODIUM 40 MG/0.4ML ~~LOC~~ SOLN
40.0000 mg | SUBCUTANEOUS | Status: DC
  Administered 2020-10-01 – 2020-11-09 (×40): 40 mg via SUBCUTANEOUS
  Filled 2020-10-01 (×40): qty 0.4

## 2020-10-01 MED ORDER — LORAZEPAM 2 MG/ML IJ SOLN
1.0000 mg | Freq: Once | INTRAMUSCULAR | Status: DC | PRN
  Filled 2020-10-01: qty 1

## 2020-10-01 MED ORDER — CLOPIDOGREL BISULFATE 75 MG PO TABS
75.0000 mg | ORAL_TABLET | Freq: Every day | ORAL | Status: DC
  Administered 2020-10-03 – 2020-11-10 (×39): 75 mg via ORAL
  Filled 2020-10-01 (×39): qty 1

## 2020-10-01 MED ORDER — ACETAMINOPHEN 325 MG PO TABS
650.0000 mg | ORAL_TABLET | ORAL | Status: DC | PRN
  Administered 2020-10-20 – 2020-10-30 (×2): 650 mg via ORAL
  Filled 2020-10-01 (×2): qty 2

## 2020-10-01 MED ORDER — ASPIRIN 300 MG RE SUPP
150.0000 mg | Freq: Every day | RECTAL | Status: DC
  Administered 2020-10-01 – 2020-10-03 (×3): 150 mg via RECTAL
  Filled 2020-10-01 (×3): qty 1

## 2020-10-01 MED ORDER — LEVETIRACETAM IN NACL 1000 MG/100ML IV SOLN
2000.0000 mg | Freq: Once | INTRAVENOUS | Status: DC
  Filled 2020-10-01: qty 200

## 2020-10-01 MED ORDER — MEMANTINE HCL 10 MG PO TABS: 10.0000 mg | ORAL_TABLET | Freq: Every day | ORAL | Status: DC

## 2020-10-01 MED ORDER — LEVETIRACETAM 750 MG PO TABS
750.0000 mg | ORAL_TABLET | Freq: Two times a day (BID) | ORAL | Status: DC
  Filled 2020-10-01: qty 1

## 2020-10-01 NOTE — ED Notes (Signed)
Back from CT

## 2020-10-01 NOTE — ED Triage Notes (Signed)
Pt arrives via EMS from home with complaints of left sided weakness, facial droop and slurred speech. LSW 1900 10/01/19  170/108 HR 73 99% RA Alert and oriented X4

## 2020-10-01 NOTE — Progress Notes (Signed)
10/01/2020 Patient transfer from the emergency room to [facility]West. She is alert to person. Patient skin was assess, no wounds noted, but feet was dry. Patient was place on telemetry monitor and central monitor was made aware. [facility] RN.

## 2020-10-01 NOTE — Progress Notes (Addendum)
HOSPITAL MEDICINE OVERNIGHT EVENT NOTE    Notified by nursing that patient has failed his bedside swallow screen.  Patient has been made NPO.  Scheduled oral medications reviewed.    Have switched oral aspirin to aspirin suppositories at the smallest available dose (was to get 81mg , switched to 150mg  suppository).  Switched patient from oral to intravenous Keppra.  Remainder of medications including statin therapy, Aricept, Plavix have been retimed for tomorrow and can hopefully be administered after patient has undergone a full SLP evaluation.  Vernelle Emerald  MD [facility]   ADDENDUM [phone] 4AM)  Nursing reports the patient has been exhibiting sinus bradycardia in the 50s without symptoms and without hypotension.  No intervention necessary.  Sherryll Burger Ethanjames Fontenot

## 2020-10-01 NOTE — ED Provider Notes (Signed)
[facility] EMERGENCY DEPARTMENT Provider Note   CSN: [id number] Arrival date & time: 10/01/20  1034  An emergency department physician performed an initial assessment on this suspected stroke patient at 36.  History Chief Complaint  Patient presents with   Code Stroke    Kristy Cabrera is a 70 y.o. female with past medical history of HTN, HLD, seizure disorder, dementia, meningioma s/p resection, and CVA s/p left endarterectomy 10/2018 currently on hospice care with DNR who presented to the ED via EMS under code stroke.  History is obtained by EMS reports that she lives at home with her husband.  Last known normal was 7 PM last night.  When she woke up this morning, she was experiencing left-sided weakness, predominantly in left upper extremity.  It then progressed to dysarthria.  Per EMS, there was mild left arm drift on their exam.  CBG was unremarkable.  Patient appears to be protecting her airway.  She appears frightened and is having difficulty communicating.    I obtained history from patient's husband, Kristy Cabrera.  He reports that yesterday at approximately 1 PM she "looked like she had had a stroke".  She states that she was drooling, could not hold her head up, and had slurred speech.  However, she called the hospice team and they sent over two RNs who were unimpressed with her exam.  Evidently she had improved, but was not yet back to her baseline.  He states that something similar to happened last month and it resolved without intervention, so he decided to observe her overnight.  This morning after she woke up, he was trying to change her shirt and she was not able to raise her left arm to assist him and she stated she felt weak on that side.  He then noticed facial droop and continued slurred speech prompting him to call EMS.  She is not on any blood thinners.  She takes Keppra 750 mg twice daily, no seizures in over 2 years.   HPI     Past Medical History:   Diagnosis Date   Brain tumor (benign) ([place])    Dementia ([place])    Episodic mood disorder ([facility])    Hyperlipidemia    Hypertension    Insomnia    Osteopenia    Weakness of both legs     Patient Active Problem List   Diagnosis Date Noted   Subclinical hyperthyroidism 10/31/2018   History of stroke 10/31/2018   Leukocytosis 10/31/2018   Syncope and collapse 10/30/2018   Stenosis of internal carotid artery with cerebral infarction, left ([place]) 10/24/2018   Seizure disorder ([place]) 10/24/2018   COPD (chronic obstructive pulmonary disease) ([place]) 10/24/2018   Syncope 10/24/2018   Acute ischemic left MCA stroke ([address]) 10/24/2018   Cognitive impairment 10/24/2018   Confusion    Acute hypokalemia    Hydrochlorothiazide adverse reaction    Hyponatremia 07/04/2016   Acute hyponatremia    Alcohol abuse    Alcohol withdrawal syndrome with complication ([place])    Acute encephalopathy    Essential hypertension 07/14/2014   Hearing loss of left ear 07/14/2014   Atypical meningioma of brain ([place]) 06/29/2014    Past Surgical History:  Procedure Laterality Date   Brain tumor removal     ENDARTERECTOMY Left 10/28/2018   Procedure: Left Carotid ENDARTERECTOMY;  Surgeon: [name], MD;  Location: [place];  Service: Vascular;  Laterality: Left;   [place]  History   No obstetric history on file.     Family History  Problem Relation Age of Onset   Diabetes Mother    Breast cancer Mother    Hypertension Mother    Kidney disease Mother    Arthritis Mother    Brain cancer Mother    Diabetes Sister    Hypertension Sister    Diabetes Brother    Hypertension Brother    Diabetes Maternal Grandmother    Hypertension Maternal Grandmother    Heart disease Maternal Grandmother    Asthma Maternal Grandfather     Social History   Tobacco Use   Smoking status: Former Smoker    Packs/day: 3.00    Types: Cigarettes     Quit date: 08/22/2014    Years since quitting: 6.1   Smokeless tobacco: Never Used  Vaping Use   Vaping Use: Never used  Substance Use Topics   Alcohol use: Not Currently    Comment: , quit 07/2014   Drug use: No    Home Medications Prior to Admission medications   Medication Sig Start Date End Date Taking? Authorizing Provider  amLODipine (NORVASC) 10 MG tablet Take 10 mg by mouth daily.    [provider]  atorvastatin (LIPITOR) 80 MG tablet Take 1 tablet (80 mg total) by mouth daily at 6 PM. 10/29/18   [name], MD  B Complex-C (SUPER B COMPLEX PO) Take 1 tablet by mouth daily.    [provider]  calcium-vitamin D (OSCAL WITH D) 500-200 MG-UNIT tablet Take 1 tablet by mouth 2 (two) times daily.    [provider]  clopidogrel (PLAVIX) 75 MG tablet Take 75 mg by mouth daily.    [provider]  donepezil (ARICEPT) 5 MG tablet Take 5 mg by mouth at bedtime.    [provider]  levETIRAcetam (KEPPRA) 750 MG tablet Take 750 mg by mouth 2 (two) times daily.    [provider]  losartan (COZAAR) 25 MG tablet Take 1 tablet (25 mg total) by mouth daily. 11/02/18   Geradine Girt, DO  Melatonin 3 MG CAPS Take 3 mg by mouth at bedtime.    [provider]  memantine (NAMENDA) 10 MG tablet Take 10 mg by mouth daily.     [provider]    Allergies    Patient has no known allergies.  Review of Systems   Review of Systems  All other systems reviewed and are negative.   Physical Exam Updated Vital Signs BP (!) 167/132 (BP Location: Right Arm)    Pulse 68    Temp 98.2 F (36.8 C) (Oral)    Resp 17    Ht 5\' 4"  (1.626 m)    Wt 64 kg    SpO2 96%    BMI 24.20 kg/m   Physical Exam Vitals and nursing note reviewed. Exam conducted with a chaperone present.  Constitutional:      General: She is not in acute distress.    Appearance: She is not toxic-appearing.  HENT:     Head: Normocephalic and atraumatic.   Eyes:     General: No scleral icterus.    Conjunctiva/sclera: Conjunctivae normal.  Cardiovascular:     Rate and Rhythm: Normal rate.     Pulses: Normal pulses.  Pulmonary:     Effort: Pulmonary effort is normal. No respiratory distress.     Comments: Protecting airway.  Able to speak.  No increased work of breathing.   Musculoskeletal:  General: Normal range of motion.     Cervical back: Normal range of motion.  Skin:    General: Skin is dry.  Neurological:     Mental Status: She is alert.     GCS: GCS eye subscore is 4. GCS verbal subscore is 5. GCS motor subscore is 6.     Sensory: No sensory deficit.     Comments: Intention tremors.  Sensation grossly intact throughout.  Dysarthria and left-sided facial droop noted on exam.  Psychiatric:        Mood and Affect: Mood normal.        Behavior: Behavior normal.        Thought Content: Thought content normal.     ED Results / Procedures / Treatments   Labs (all labs ordered are listed, but only abnormal results are displayed) Labs Reviewed  APTT - Abnormal; Notable for the following components:      Result Value   aPTT 20 (*)    All other components within normal limits  CBC - Abnormal; Notable for the following components:   WBC 11.9 (*)    Hemoglobin 15.3 (*)    HCT 46.4 (*)    All other components within normal limits  DIFFERENTIAL - Abnormal; Notable for the following components:   Neutro Abs 8.0 (*)    All other components within normal limits  COMPREHENSIVE METABOLIC PANEL - Abnormal; Notable for the following components:   CO2 21 (*)    Glucose, Bld 124 (*)    All other components within normal limits  CBG MONITORING, ED - Abnormal; Notable for the following components:   Glucose-Capillary 117 (*)    All other components within normal limits  URINE CULTURE  PROTIME-INR  LEVETIRACETAM LEVEL  URINALYSIS, ROUTINE W REFLEX MICROSCOPIC  I-STAT CHEM 8, ED    EKG None  Radiology CT HEAD CODE STROKE  WO CONTRAST  Result Date: 10/01/2020 CLINICAL DATA:  Code stroke. 70 year old female with facial droop and left side weakness. EXAM: CT HEAD WITHOUT CONTRAST TECHNIQUE: Contiguous axial images were obtained from the base of the skull through the vertex without intravenous contrast. COMPARISON:  Brain MRI 10/18/2019.  Head CT 10/18/2019 and earlier. FINDINGS: Brain: Fairly advanced chronic encephalomalacia in both anterior frontal lobes. Ex vacuo enlargement of the frontal horns with mild additional generalized ex vacuo appearing ventricular enlargement. Superimposed abnormal hypodensity at the right insula and operculum on series 2, image 16. But elsewhere gray-white matter differentiation appears stable, with chronic encephalomalacia in the right basal ganglia. No acute intracranial hemorrhage identified. No midline shift, mass effect, or evidence of intracranial mass lesion. Vascular: Calcified atherosclerosis at the skull base. No suspicious intracranial vascular hyperdensity. Skull: Sequelae of bifrontal craniotomy, stable. No acute osseous abnormality identified. Sinuses/Orbits: Visualized paranasal sinuses and mastoids are clear. Other: No definite gaze deviation, acute scalp soft tissue finding. ASPECTS [facility] Stroke Program Early CT Score) Total score (0-10 with 10 being normal): 8 (acutely abnormal right insula and M2 segment. Chronically abnormal right basal ganglia, anterior frontal encephalomalacia). IMPRESSION: 1. Positive for acute cortically based infarct of the right insula and operculum. ASPECTS 8. No associated hemorrhage or mass effect. 2. Underlying chronic bifrontal encephalomalacia including chronic involvement of the bilateral basal ganglia. Prior bifrontal craniotomy. 3. These results were communicated to Dr. Curly Shores at 10:56 am on 10/01/2020 by text page via the [facility] messaging system. Electronically Signed   By: Genevie Ann M.D.   On: 10/01/2020 10:57    Procedures .Critical  Care  Performed by: Corena Herter, PA-C Authorized by: Corena Herter, PA-C   Critical care provider statement:    Critical care time (minutes):  45   Critical care was necessary to treat or prevent imminent or life-threatening deterioration of the following conditions:  CNS failure or compromise   Critical care was time spent personally by me on the following activities:  Discussions with consultants, evaluation of patient's response to treatment, examination of patient, ordering and performing treatments and interventions, ordering and review of laboratory studies, ordering and review of radiographic studies, pulse oximetry, re-evaluation of patient's condition, obtaining history from patient or surrogate and review of old charts Comments:     Acute stroke     Medications Ordered in ED Medications  sodium chloride flush (NS) 0.9 % injection 3 mL (3 mLs Intravenous Not Given 10/01/20 1323)  levETIRAcetam (KEPPRA) tablet 750 mg (has no administration in time range)  levETIRAcetam (KEPPRA) IVPB 1000 mg/100 mL premix (0 mg Intravenous Stopped 10/01/20 1235)    ED Course  I have reviewed the triage vital signs and the nursing notes.  Pertinent labs & imaging results that were available during my care of the patient were reviewed by me and considered in my medical decision making (see chart for details).    MDM Rules/Calculators/A&P                          Georgette Helmer was evaluated in Emergency Department on 10/01/2020 for the symptoms described in the history of present illness. She was evaluated in the context of the global COVID-19 pandemic, which necessitated consideration that the patient might be at risk for infection with the SARS-CoV-2 virus that causes COVID-19. Institutional protocols and algorithms that pertain to the evaluation of patients at risk for COVID-19 are in a state of rapid change based on information released by regulatory bodies including the CDC and federal and  state organizations. These policies and algorithms were followed during the patient's care in the ED.  I personally reviewed patient's medical chart and all notes from triage and staff during today's encounter. I have also ordered and reviewed all labs and imaging that I felt to be medically necessary in the evaluation of this patient's complaints and with consideration of their physical exam. If needed, translation services were available and utilized.   History of physical exam concerning for intermittent seizures, CVA, TIA, or encephalopathy.  Will obtain comprehensive laboratory work-up and imaging of brain.    Initial CBG reassuring.  Laboratory work-up unremarkable.  Patient has a mild leukocytosis to 11.9, but improved when compared to priors.  No other significant derangement.  Patient has been started on 1 L IV Keppra load.  CT head obtained without contrast is positive for acute cortically based infarct of the right insula and operculum.  Will re-consult with Dr. Curly Shores, neurology.  She spoke with the hospice team as well as patient's family.  Decision is to admit to hospitalist for stroke work-up.   I spoke with Dr. Roosevelt Locks who will see and admit patient.  Final Clinical Impression(s) / ED Diagnoses Final diagnoses:  Acute ischemic stroke [facility])    Rx / DC Orders ED Discharge Orders    None       Corena Herter, PA-C 10/04/20 1724    Pattricia Boss, MD 10/06/20 1219

## 2020-10-01 NOTE — Code Documentation (Signed)
Patient presented to [facility] via EMS for Code Stroke.  Patient has been having left sided weakness since she work up this morning.  Code stroke was activated at 1014.  The patient arrived at 75.  NIHSS was Her LKW was 1900 on 09/30/20.  Treatment decision was made on 1055 to q2 hour neuro checks.

## 2020-10-01 NOTE — Plan of Care (Signed)
  Problem: Education: Goal: Knowledge of General Education information will improve Description: Including pain rating scale, medication(s)/side effects and non-pharmacologic comfort measures Outcome: Progressing   Problem: Health Behavior/Discharge Planning: Goal: Ability to manage health-related needs will improve Outcome: Progressing   Problem: Clinical Measurements: Goal: Ability to maintain clinical measurements within normal limits will improve Outcome: Progressing Goal: Will remain free from infection Outcome: Progressing Goal: Diagnostic test results will improve Outcome: Progressing Goal: Respiratory complications will improve Outcome: Progressing Goal: Cardiovascular complication will be avoided Outcome: Progressing   Problem: Clinical Measurements: Goal: Ability to maintain clinical measurements within normal limits will improve Outcome: Progressing Goal: Will remain free from infection Outcome: Progressing Goal: Diagnostic test results will improve Outcome: Progressing Goal: Respiratory complications will improve Outcome: Progressing Goal: Cardiovascular complication will be avoided Outcome: Progressing   Problem: Activity: Goal: Risk for activity intolerance will decrease Outcome: Progressing   Problem: Nutrition: Goal: Adequate nutrition will be maintained Outcome: Progressing   Problem: Coping: Goal: Level of anxiety will decrease Outcome: Progressing   Problem: Elimination: Goal: Will not experience complications related to bowel motility Outcome: Progressing Goal: Will not experience complications related to urinary retention Outcome: Progressing   Problem: Pain Managment: Goal: General experience of comfort will improve Outcome: Progressing   Problem: Safety: Goal: Ability to remain free from injury will improve Outcome: Progressing   Problem: Skin Integrity: Goal: Risk for impaired skin integrity will decrease Outcome: Progressing    Problem: Education: Goal: Knowledge of disease or condition will improve Outcome: Progressing Goal: Knowledge of secondary prevention will improve Outcome: Progressing Goal: Knowledge of patient specific risk factors addressed and post discharge goals established will improve Outcome: Progressing Goal: Individualized Educational Video(s) Outcome: Progressing   Problem: Coping: Goal: Will verbalize positive feelings about self Outcome: Progressing Goal: Will identify appropriate support needs Outcome: Progressing   Problem: Health Behavior/Discharge Planning: Goal: Ability to manage health-related needs will improve Outcome: Progressing   Problem: Self-Care: Goal: Ability to participate in self-care as condition permits will improve Outcome: Progressing Goal: Verbalization of feelings and concerns over difficulty with self-care will improve Outcome: Progressing Goal: Ability to communicate needs accurately will improve Outcome: Progressing   Problem: Nutrition: Goal: Risk of aspiration will decrease Outcome: Progressing Goal: Dietary intake will improve Outcome: Progressing   Problem: Ischemic Stroke/TIA Tissue Perfusion: Goal: Complications of ischemic stroke/TIA will be minimized Outcome: Progressing
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
## 2020-10-01 NOTE — H&P (Signed)
History and Physical    Kristy Cabrera KDT:[id number] DOB: 06/03/1951 DOA: 10/01/2020  PCP: [name], NP (Confirm with patient/family/NH records and if not entered, this has to be entered at [facility] point of entry) Patient coming from: home  I have personally briefly reviewed patient's old medical records in [place]  Chief Complaint: Facial droop, weakness and slu  HPI: Kristy Cabrera is a 70 y.o. female with medical history significant of advanced dementia, HTN, meningioma s/p resection, seizure, left MCA stroke s/p left-sided endarterectomy in 2020, HLD, presented with new onset of left facial droop, left-sided weakness and slurred speech.  Patient unable to provide any history given her advanced dementia and speech problem, so most history provided by patient husband over the phone and ED record.  Husband reported that patient was started home hospice 4 to 5 months ago due to worsening of her terminal dementia, at that point, duo antiplatelet regimen aspirin and Plavix reduced to Plavix alone regimen.  Yesterday afternoon after lunch around 1 PM, husband started noticed patient developed a left-sided drooping and slurred speech.  Husband reevaluate in the evening and found her symptoms improved, but this morning her left-sided facial droop and slurred speech came back and and patient appears to be more confused.  Lately, due to insurance coverage issue, Aricept was stopped about 1 week ago and patient husband found patient has been more confused than before the whole week.  ED Course: Code stroke was called, initially was considered to have breakthrough seizure and Keppra loading was done.  However CT head acute stroke on right insula and operculum.  Review of Systems: Unable to perform, patient confused.  Past Medical History:  Diagnosis Date   Brain tumor (benign) ([place])    Dementia ([place])    Episodic mood disorder ([facility])    Hyperlipidemia    Hypertension    Insomnia     Osteopenia    Weakness of both legs     Past Surgical History:  Procedure Laterality Date   Brain tumor removal     ENDARTERECTOMY Left 10/28/2018   Procedure: Left Carotid ENDARTERECTOMY;  Surgeon: [name], MD;  Location: [facility];  Service: Vascular;  Laterality: Left;   [place]     reports that she quit smoking about 6 years ago. Her smoking use included cigarettes. She smoked 3.00 packs per day. She has never used smokeless tobacco. She reports previous alcohol use. She reports that she does not use drugs.  No Known Allergies  Family History  Problem Relation Age of Onset   Diabetes Mother    Breast cancer Mother    Hypertension Mother    Kidney disease Mother    Arthritis Mother    Brain cancer Mother    Diabetes Sister    Hypertension Sister    Diabetes Brother    Hypertension Brother    Diabetes Maternal Grandmother    Hypertension Maternal Grandmother    Heart disease Maternal Grandmother    Asthma Maternal Grandfather      Prior to Admission medications   Medication Sig Start Date End Date Taking? Authorizing Provider  amLODipine (NORVASC) 10 MG tablet Take 10 mg by mouth daily.    [provider]  atorvastatin (LIPITOR) 80 MG tablet Take 1 tablet (80 mg total) by mouth daily at 6 PM. 10/29/18   [name], MD  B Complex-C (SUPER B COMPLEX PO) Take 1 tablet by mouth daily.    [provider]  calcium-vitamin D (OSCAL WITH D) 500-200 MG-UNIT tablet Take 1 tablet by mouth 2 (two) times daily.    [provider]  clopidogrel (PLAVIX) 75 MG tablet Take 75 mg by mouth daily.    [provider]  donepezil (ARICEPT) 5 MG tablet Take 5 mg by mouth at bedtime.    [provider]  levETIRAcetam (KEPPRA) 750 MG tablet Take 750 mg by mouth 2 (two) times daily.    [provider]  losartan (COZAAR) 25 MG tablet Take 1 tablet (25 mg total) by mouth daily. 11/02/18   Geradine Girt, DO  Melatonin 3 MG CAPS Take 3 mg by mouth at bedtime.    [provider]  memantine (NAMENDA) 10 MG tablet Take 10 mg by mouth daily.     [provider]    Physical Exam: Vitals:   10/01/20 1200 10/01/20 1300 10/01/20 1338 10/01/20 1341  BP: (!) 143/73 136/75  (!) 167/132  Pulse: 70 73  68  Resp: (!) 28 16  17   Temp:      TempSrc:      SpO2: 95% 97%  96%  Weight:   64 kg   Height:   5\' 4"  (1.626 m)     Constitutional: NAD, calm, comfortable Vitals:   10/01/20 1200 10/01/20 1300 10/01/20 1338 10/01/20 1341  BP: (!) 143/73 136/75  (!) 167/132  Pulse: 70 73  68  Resp: (!) 28 16  17   Temp:      TempSrc:      SpO2: 95% 97%  96%  Weight:   64 kg   Height:   5\' 4"  (1.626 m)    Eyes: PERRL, lids and conjunctivae normal ENMT: Mucous membranes are moist. Posterior pharynx clear of any exudate or lesions.Normal dentition.  Neck: normal, supple, no masses, no thyromegaly Respiratory: clear to auscultation bilaterally, no wheezing, no crackles. Normal respiratory effort. No accessory muscle use.  Cardiovascular: Regular rate and rhythm, no murmurs / rubs / gallops. No extremity edema. 2+ pedal pulses. No carotid bruits.  Abdomen: no tenderness, no masses palpated. No hepatosplenomegaly. Bowel sounds positive.  Musculoskeletal: no clubbing / cyanosis. No joint deformity upper and lower extremities. Good ROM, no contractures. Normal muscle tone.  Skin: no rashes, lesions, ulcers. No induration Neurologic: Left-sided facial droop and left-sided tongue deviation, muscle strength appears to be equal 4/5 bilaterally.   Psychiatric: Confused    Labs on Admission: I have personally reviewed following labs and imaging studies  CBC: Recent Labs  Lab 10/01/20 1044  WBC 11.9*  NEUTROABS 8.0*  HGB 15.3*  HCT 46.4*  MCV 91.9  PLT 010   Basic Metabolic Panel: Recent Labs  Lab 10/01/20 1044  NA 137  K 3.5  CL 104  CO2 21*  GLUCOSE 124*  BUN 17   CREATININE 0.86  CALCIUM 9.7   GFR: Estimated Creatinine Clearance: 52.6 mL/min (by C-G formula based on SCr of 0.86 mg/dL). Liver Function Tests: Recent Labs  Lab 10/01/20 1044  AST 23  ALT 15  ALKPHOS 102  BILITOT 0.8  PROT 8.0  ALBUMIN 4.1   No results for input(s): LIPASE, AMYLASE in the last 168 hours. No results for input(s): AMMONIA in the last 168 hours. Coagulation Profile: Recent Labs  Lab 10/01/20 1044  INR 0.9   Cardiac Enzymes: No results for input(s): CKTOTAL, CKMB, CKMBINDEX, TROPONINI in the last 168 hours. BNP (last 3 results) No results for input(s): PROBNP in the last 8760  hours. HbA1C: No results for input(s): HGBA1C in the last 72 hours. CBG: Recent Labs  Lab 10/01/20 1039  GLUCAP 117*   Lipid Profile: No results for input(s): CHOL, HDL, LDLCALC, TRIG, CHOLHDL, LDLDIRECT in the last 72 hours. Thyroid Function Tests: No results for input(s): TSH, T4TOTAL, FREET4, T3FREE, THYROIDAB in the last 72 hours. Anemia Panel: No results for input(s): VITAMINB12, FOLATE, FERRITIN, TIBC, IRON, RETICCTPCT in the last 72 hours. Urine analysis:    Component Value Date/Time   COLORURINE YELLOW 10/30/2018 1530   APPEARANCEUR HAZY (A) 10/30/2018 1530   LABSPEC 1.014 10/30/2018 1530   PHURINE 7.0 10/30/2018 1530   GLUCOSEU NEGATIVE 10/30/2018 1530   HGBUR NEGATIVE 10/30/2018 1530   BILIRUBINUR NEGATIVE 10/30/2018 [place] 10/30/2018 1530   PROTEINUR NEGATIVE 10/30/2018 1530   NITRITE NEGATIVE 10/30/2018 1530   LEUKOCYTESUR NEGATIVE 10/30/2018 1530    Radiological Exams on Admission: CT HEAD CODE STROKE WO CONTRAST  Result Date: 10/01/2020 CLINICAL DATA:  Code stroke. 70 year old female with facial droop and left side weakness. EXAM: CT HEAD WITHOUT CONTRAST TECHNIQUE: Contiguous axial images were obtained from the base of the skull through the vertex without intravenous contrast. COMPARISON:  Brain MRI 10/18/2019.  Head CT 10/18/2019  and earlier. FINDINGS: Brain: Fairly advanced chronic encephalomalacia in both anterior frontal lobes. Ex vacuo enlargement of the frontal horns with mild additional generalized ex vacuo appearing ventricular enlargement. Superimposed abnormal hypodensity at the right insula and operculum on series 2, image 16. But elsewhere gray-white matter differentiation appears stable, with chronic encephalomalacia in the right basal ganglia. No acute intracranial hemorrhage identified. No midline shift, mass effect, or evidence of intracranial mass lesion. Vascular: Calcified atherosclerosis at the skull base. No suspicious intracranial vascular hyperdensity. Skull: Sequelae of bifrontal craniotomy, stable. No acute osseous abnormality identified. Sinuses/Orbits: Visualized paranasal sinuses and mastoids are clear. Other: No definite gaze deviation, acute scalp soft tissue finding. ASPECTS [facility] Stroke Program Early CT Score) Total score (0-10 with 10 being normal): 8 (acutely abnormal right insula and M2 segment. Chronically abnormal right basal ganglia, anterior frontal encephalomalacia). IMPRESSION: 1. Positive for acute cortically based infarct of the right insula and operculum. ASPECTS 8. No associated hemorrhage or mass effect. 2. Underlying chronic bifrontal encephalomalacia including chronic involvement of the bilateral basal ganglia. Prior bifrontal craniotomy. 3. These results were communicated to Dr. Curly Sh[place] at 10:56 am on 10/01/2020 by text page via the [facility] messaging system. Electronically Signed   By: Genevie Ann M.D.   On: 10/01/2020 10:57    EKG: Independently reviewed.  Sinus, no PR or QTC changes  Assessment/Plan Active Problems:   Stroke (cerebrum) (HCC)  (please populate well all problems here in Problem List. (For example, if patient is on BP meds at home and you resume or decide to hold them, it is a problem that needs to be her. Same for CAD, COPD, HLD and so on)  Acute right-sided  stroke -Continue Plavix, add back ASA -Given her clear CT head result, and Hospice status, do not see much benefit to perform MRI, MRA in this case.  Defer to neurology. -Hold home BP meds, allow permissive hypertension. -Continue statin -Will start NPO given her significant facial droop and tongue deviation.  3 fluids x24 hours then reevaluate.  Seizure disorder -Continue Keppra  Advanced dementia -Discussed with patient's husband on the phone, restart Aricept. -Consult in house palliative team to help [place] manage patient's stroke symptoms.  DVT prophylaxis: Lovenox  code Status:DNR Family Communication: Husband  over phone Disposition Plan: Likely will need 2 midnight hospital stay, expect will need rehab Consults called: Neuro Admission status: Tele admit   Lequita Halt MD [facility] Pager [phone]  10/01/2020, 2:20 PM

## 2020-10-01 NOTE — ED Notes (Signed)
CT notified about successful IV start
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
## 2020-10-02 ENCOUNTER — Inpatient Hospital Stay (HOSPITAL_COMMUNITY)

## 2020-10-02 ENCOUNTER — Encounter (HOSPITAL_COMMUNITY): Payer: Self-pay | Admitting: Internal Medicine

## 2020-10-02 DIAGNOSIS — E785 Hyperlipidemia, unspecified: Secondary | ICD-10-CM | POA: Diagnosis not present

## 2020-10-02 DIAGNOSIS — F0391 Unspecified dementia with behavioral disturbance: Secondary | ICD-10-CM

## 2020-10-02 DIAGNOSIS — I6389 Other cerebral infarction: Secondary | ICD-10-CM | POA: Diagnosis not present

## 2020-10-02 LAB — LIPID PANEL
Cholesterol: 220 mg/dL — ABNORMAL HIGH (ref 0–200)
HDL: 53 mg/dL (ref >40)
LDL Cholesterol: 153 mg/dL — ABNORMAL HIGH (ref 0–99)
Total CHOL/HDL Ratio: 4.2 RATIO
Triglycerides: 72 mg/dL (ref <150)
VLDL: 14 mg/dL (ref 0–40)

## 2020-10-02 LAB — ECHOCARDIOGRAM COMPLETE
AV Mean grad: 2 mmHg
AV Peak grad: 3.6 mmHg
Ao pk vel: 0.95 m/s
Area-P 1/2: 1.72 cm2
Height: 64 in
S' Lateral: 2.1 cm
Weight: 2256 oz

## 2020-10-02 LAB — HEMOGLOBIN A1C
Hgb A1c MFr Bld: 5.7 % — ABNORMAL HIGH (ref 4.8–5.6)
Mean Plasma Glucose: 116.89 mg/dL

## 2020-10-02 MED ORDER — PERFLUTREN LIPID MICROSPHERE
1.0000 mL | INTRAVENOUS | Status: AC | PRN
  Administered 2020-10-02: 1 mL via INTRAVENOUS
  Filled 2020-10-02: qty 10

## 2020-10-02 MED ORDER — SODIUM CHLORIDE 0.9 % IV SOLN: INTRAVENOUS | Status: DC

## 2020-10-02 NOTE — Evaluation (Addendum)
Occupational Therapy Evaluation Patient Details Name: Kristy Cabrera MRN: [id number] DOB: 11/06/1950 Today's Date: 10/02/2020    History of Present Illness 70 y.o. female with medical history significant for advanced dementia, HTN, meningioma s/p resection, seizure, unspecified left MCA stroke s/p left-sided endarterectomy in 2020, HLD, currently enrolled in home hospice presented with new onset of left facial droop, left-sided weakness and slurred speech.  CT of the head showed acute stroke on right insula and operculum.   Clinical Impression   Pt PTA: Per chart, pt was living at home with spouse and reports able to perform grooming and feeding, but assist for mobility with rollator ~50*. Pt with advanced dementia at baseline. Following simple commands 75% of time with repetition for cues; Pt able to speak about PLOF stating "I have an aid for 3 times a week for bathing/dressing routine. Pt limited by decreased strength, decreased cognition at baseline for poor carry over skills and decreased ability to care for self. Pt following most commands with multimodal cues. Pt would benefit from continued OT skilled services. OT following acutely.    Follow Up Recommendations  SNF;Home health OT;Supervision/Assistance - 24 hour (SNF vs HH based on assist level at home)    Equipment Recommendations  3 in 1 bedside commode    Recommendations for Other Services       Precautions / Restrictions Precautions Precautions: Fall;Other (comment) Precaution Comments: advanced dementia Restrictions Weight Bearing Restrictions: No      Mobility Bed Mobility Overal bed mobility: Needs Assistance Bed Mobility: Supine to Sit;Sit to Supine     Supine to sit: Mod assist;HOB elevated Sit to supine: Min assist;HOB elevated   General bed mobility comments: assist for rails; pt prefers to be covered up in bed due to feeling cold    Transfers Overall transfer level: Needs assistance Equipment used: 1  person hand held assist Transfers: Sit to/from Stand Sit to Stand: Min assist         General transfer comment: sit to stand with minA for power up assist; use of RW    Balance Overall balance assessment: Needs assistance Sitting-balance support: No upper extremity supported;Feet supported Sitting balance-Leahy Scale: Good Sitting balance - Comments: assist to lower BLEs to scoot to [facility] Postural control: Posterior lean Standing balance support: Bilateral upper extremity supported;During functional activity Standing balance-Leahy Scale: Poor Standing balance comment: reliant on external support             High level balance activites: Backward walking High Level Balance Comments: Difficulty with backward walking           ADL either performed or assessed with clinical judgement   ADL Overall ADL's : Needs assistance/impaired Eating/Feeding: Set up;Sitting   Grooming: Minimal assistance;Sitting   Upper Body Bathing: Minimal assistance;Sitting   Lower Body Bathing: Maximal assistance;Cueing for safety;Cueing for sequencing;Sitting/lateral leans;Sit to/from stand   Upper Body Dressing : Minimal assistance;Sitting   Lower Body Dressing: Maximal assistance;Sitting/lateral leans;Sit to/from stand;Cueing for safety   Toilet Transfer: Minimal assistance;Ambulation;BSC;RW   Toileting- Clothing Manipulation and Hygiene: Maximal assistance;Cueing for safety;Cueing for sequencing;Sitting/lateral lean;Sit to/from stand       Functional mobility during ADLs: Minimal assistance;Rolling walker;Cueing for safety General ADL Comments: Pt limited by decreased strength, decreased cognition at baseline for poor carry over skills and decreased ability to care for self. Pt following most commands with multimodal cues.     Vision Baseline Vision/History: No visual deficits Patient Visual Report: No change from baseline Vision Assessment?: No apparent visual deficits  Perception     Praxis      Pertinent Vitals/Pain Pain Assessment: Faces Faces Pain Scale: Hurts a little bit Pain Location: generalized with movement Pain Descriptors / Indicators: Discomfort;Grimacing Pain Intervention(s): Monitored during session;Repositioned     Hand Dominance Right   Extremity/Trunk Assessment Upper Extremity Assessment Upper Extremity Assessment: Generalized weakness   Lower Extremity Assessment Lower Extremity Assessment: Generalized weakness   Cervical / Trunk Assessment Cervical / Trunk Assessment: Kyphotic   Communication Communication Communication: HOH   Cognition Arousal/Alertness: Awake/alert Behavior During Therapy: WFL for tasks assessed/performed Overall Cognitive Status: Impaired/Different from baseline                                 General Comments: advanced dementia at baseline. Following simple commands 75% of time with repetition for cues; Pt able to speak about PLOF stating "I have an aid for 3 times a week for bathing/dressing routine.   General Comments  Pt sleeping on arrival. Able to wake her for brief participation in eval, but continually attempting to return to bed/sleeping.    Exercises     Shoulder Instructions      Home Living Family/patient expects to be discharged to:: Private residence Living Arrangements: Spouse/significant other Available Help at Discharge: Family;Available 24 hours/day Type of Home: House Home Access: Stairs to enter [employer] of Steps: 4 Entrance Stairs-Rails: Right;Left Home Layout: Two level;Able to live on main level with bedroom/bathroom     Bathroom Shower/Tub: Walk-in shower   Bathroom Toilet: Handicapped height     Home Equipment: Environmental consultant - 2 wheels;Shower seat - built in;Walker - 4 wheels   Additional Comments: Pt on home hospice for advanced dementia.  Lives With: Spouse    Prior Functioning/Environment Level of Independence: Needs assistance   Gait / Transfers Assistance Needed: amb 89' with rollator ADL's / Homemaking Assistance Needed: mod I dressing/bathing and self feeding   Comments: Home set up taken from previous admission. PLOF provided by husband to SLP via phone.        OT Problem List: Decreased strength;Decreased activity tolerance;Impaired balance (sitting and/or standing);Decreased cognition;Decreased safety awareness;Cardiopulmonary status limiting activity;Increased edema      OT Treatment/Interventions: Self-care/ADL training;Therapeutic exercise;DME and/or AE instruction;Therapeutic activities;Patient/family education;Balance training    OT Goals(Current goals can be found in the care plan section) Acute Rehab OT Goals Patient Stated Goal: unable to state OT Goal Formulation: With patient Time For Goal Achievement: 10/16/20 Potential to Achieve Goals: Good ADL Goals Pt Will Perform Eating: with set-up;sitting Pt Will Perform Grooming: with set-up;sitting Pt Will Transfer to Toilet: with supervision;bedside commode;ambulating Additional ADL Goal #1: Pt will follow 1 step commands to assess ability to follow ADL and mobility.  OT Frequency: Min 2X/week   Barriers to D/Cabrera:            Co-evaluation              AM-PAC OT "6 Clicks" Daily Activity     Outcome Measure Help from another person eating meals?: None Help from another person taking care of personal grooming?: A Little Help from another person toileting, which includes using toliet, bedpan, or urinal?: A Lot Help from another person bathing (including washing, rinsing, drying)?: A Lot Help from another person to put on and taking off regular upper body clothing?: A Little Help from another person to put on and taking off regular lower body clothing?: A Lot 6 Click Score: 16  End of Session Equipment Utilized During Treatment: Gait belt;Rolling walker Nurse Communication: Mobility status  Activity Tolerance: Patient tolerated  treatment well Patient left: in bed;with call bell/phone within reach;with bed alarm set  OT Visit Diagnosis: Unsteadiness on feet (R26.81);Muscle weakness (generalized) (M62.81)                Time: 3837-7939 OT Time Calculation (min): 16 min Charges:  OT General Charges $OT Visit: 1 Visit OT Evaluation $OT Eval Moderate Complexity: 1 Mod  Jefferey Pica, OTR/L Acute Rehabilitation Services Pager: [phone] Office: [phone]   Kristy Cabrera 10/02/2020, 4:05 PM

## 2020-10-02 NOTE — Progress Notes (Signed)
Patient ID: Kristy Cabrera, female   DOB: [birth year], 70 y.o.   MRN: [id number]  PROGRESS NOTE    Corisa Montini  GEX:[id number] DOB: 09/02/1950 DOA: 10/01/2020 PCP: [name], NP   Brief Narrative:  70 y.o. female with medical history significant of advanced dementia, HTN, meningioma s/p resection, seizure, unspecified left MCA stroke s/p left-sided endarterectomy in 2020, HLD, currently enrolled in home hospice presented with new onset of left facial droop, left-sided weakness and slurred speech.  On presentation code stroke was called.  CT of the head showed acute stroke on right insula and operculum.  Neurology was consulted.  Assessment & Plan:   Acute right-sided stroke -Presented with left-sided facial droop, left-sided weakness and slurred speech.  CT of the head was showing acute stroke in right insula and operculum.  MRI of brain showed multiple small acute right MCA territory infarcts.  CTA head showed new proximal right M2 occlusion with distal reconstitution with severe intracranial atherosclerosis and unchanged 50% mid right common carotid artery stenosis and patent left carotid endarterectomy without recurrent stenosis -Continue neurochecks.  Continue aspirin, statin and Plavix.  Neurology following. -PT/OT/SLP evaluation.  Follow echocardiogram -Antihypertensives being on hold for now.  Allow for permissive hypertension -A1c 5.7 -LDL 153 and cholesterol 220  Advanced dementia -Continue Aricept. -Fall precautions.  Delirium precautions. -Patient is enrolled in home hospice.  Palliative care consultation appreciated.  If feeding becomes an issue, will involve palliative care again. -TOC consultation for resumption of home hospice care  Seizure disorder -Continue Keppra, currently getting IV  Hyperlipidemia -Continue statin  Mild leukocytosis -Monitor   DVT prophylaxis: Lovenox Code Status: DNR Family Communication: None at bedside Disposition Plan: Status is:  Inpatient  Remains inpatient appropriate because:Inpatient level of care appropriate due to severity of illness   Dispo: The patient is from: Home              Anticipated d/c is to: Home with resumption of hospice care              Patient currently is not medically stable to d/c.   Difficult to place patient No  Consultants: Neurology/palliative care  Procedures: Echo report pending  Antimicrobials: None   Subjective: Patient seen and examined at bedside.  Awake, confused, hardly participates in any conversation.  No overnight fever or vomiting reported.  Objective: Vitals:   10/02/20 0200 10/02/20 0343 10/02/20 0600 10/02/20 0857  BP: 134/70 138/87 (!) 118/106 (!) 149/65  Pulse: 63 72 67 69  Resp: 13 18 15 17   Temp: 97.9 F (36.6 C) 97.8 F (36.6 C) 97.6 F (36.4 C) 98.1 F (36.7 C)  TempSrc: Oral  Axillary   SpO2: 97% 99% 100% 99%  Weight:      Height:        Intake/Output Summary (Last 24 hours) at 10/02/2020 0955 Last data filed at 10/02/2020 0600 Gross per 24 hour  Intake 527.52 ml  Output 950 ml  Net -422.48 ml   Filed Weights   10/01/20 1338  Weight: 64 kg    Examination:  General exam: Appears calm and comfortable.  Looks chronically ill.  Elderly female lying in bed.  Currently on room air. Respiratory system: Bilateral decreased breath sounds at bases Cardiovascular system: S1 & S2 heard, Rate controlled Gastrointestinal system: Abdomen is nondistended, soft and nontender. Normal bowel sounds heard. Extremities: No cyanosis, clubbing; trace lower extremity edema Central nervous system: Awake, confused, hardly participates in any conversation. No focal neurological deficits. Moving extremities  Skin: No rashes, lesions or ulcers Psychiatry: Cannot be assessed because of mental status    Data Reviewed: I have personally reviewed following labs and imaging studies  CBC: Recent Labs  Lab 10/01/20 1044  WBC 11.9*  NEUTROABS 8.0*  HGB 15.3*   HCT 46.4*  MCV 91.9  PLT 144   Basic Metabolic Panel: Recent Labs  Lab 10/01/20 1044  NA 137  K 3.5  CL 104  CO2 21*  GLUCOSE 124*  BUN 17  CREATININE 0.86  CALCIUM 9.7   GFR: Estimated Creatinine Clearance: 52.6 mL/min (by C-G formula based on SCr of 0.86 mg/dL). Liver Function Tests: Recent Labs  Lab 10/01/20 1044  AST 23  ALT 15  ALKPHOS 102  BILITOT 0.8  PROT 8.0  ALBUMIN 4.1   No results for input(s): LIPASE, AMYLASE in the last 168 hours. No results for input(s): AMMONIA in the last 168 hours. Coagulation Profile: Recent Labs  Lab 10/01/20 1044  INR 0.9   Cardiac Enzymes: No results for input(s): CKTOTAL, CKMB, CKMBINDEX, TROPONINI in the last 168 hours. BNP (last 3 results) No results for input(s): PROBNP in the last 8760 hours. HbA1C: Recent Labs    10/02/20 0342  HGBA1C 5.7*   CBG: Recent Labs  Lab 10/01/20 1039  GLUCAP 117*   Lipid Profile: Recent Labs    10/02/20 0342  CHOL 220*  HDL 53  LDLCALC 153*  TRIG 72  CHOLHDL 4.2   Thyroid Function Tests: No results for input(s): TSH, T4TOTAL, FREET4, T3FREE, THYROIDAB in the last 72 hours. Anemia Panel: No results for input(s): VITAMINB12, FOLATE, FERRITIN, TIBC, IRON, RETICCTPCT in the last 72 hours. Sepsis Labs: No results for input(s): PROCALCITON, LATICACIDVEN in the last 168 hours.  Recent Results (from the past 240 hour(s))  SARS CORONAVIRUS 2 (TAT 6-24 HRS) Nasopharyngeal Nasopharyngeal Swab     Status: None   Collection Time: 10/01/20  2:21 PM   Specimen: Nasopharyngeal Swab  Result Value Ref Range Status   SARS Coronavirus 2 NEGATIVE NEGATIVE Final    Comment: (NOTE) SARS-CoV-2 target nucleic acids are NOT DETECTED.  The SARS-CoV-2 RNA is generally detectable in upper and lower respiratory specimens during the acute phase of infection. Negative results do not preclude SARS-CoV-2 infection, do not rule out co-infections with other pathogens, and should not be used as  the sole basis for treatment or other patient management decisions. Negative results must be combined with clinical observations, patient history, and epidemiological information. The expected result is Negative.  Fact Sheet for Patients: SugarRoll.be  Fact Sheet for Healthcare Providers: https://www.woods-mathews.com/  This test is not yet approved or cleared by the [place] FDA and  has been authorized for detection and/or diagnosis of SARS-CoV-2 by FDA under an Emergency Use Authorization (EUA). This EUA will remain  in effect (meaning this test can be used) for the duration of the COVID-19 declaration under Se ction 564(b)(1) of the Act, 21 U.S.C. section 360bbb-3(b)(1), unless the authorization is terminated or revoked sooner.  Performed at [facility], [place] [address], [place], [place] [address]          Radiology Studies: CT ANGIO HEAD W OR WO CONTRAST  Result Date: 10/01/2020 CLINICAL DATA:  Left-sided weakness. Acute right insular infarct on CT. EXAM: CT ANGIOGRAPHY HEAD AND NECK TECHNIQUE: Multidetector CT imaging of the head and neck was performed using the standard protocol during bolus administration of intravenous contrast. Multiplanar CT image reconstructions and MIPs were obtained to evaluate the vascular  anatomy. Carotid stenosis measurements (when applicable) are obtained utilizing NASCET criteria, using the distal internal carotid diameter as the denominator. CONTRAST:  57mL OMNIPAQUE IOHEXOL 350 MG/ML SOLN COMPARISON:  10/18/2019 FINDINGS: CTA NECK FINDINGS Aortic arch: Incomplete imaging of the aortic arch with prominent calcified plaque. Common origin of the brachiocephalic and left common carotid arteries, a normal variant. Similar appearance of proximal bilateral subclavian artery stenoses due to calcified plaque, severe on the right and moderate on the left. Right carotid system: Patent with prominent,  predominantly calcified plaque scattered throughout the common carotid and proximal internal carotid arteries resulting in unchanged 50% mid common carotid artery stenosis. No significant ICA stenosis. Left carotid system: Patent following endarterectomy without recurrent stenosis. Vertebral arteries: The vertebral arteries are patent and codominant with scattered atherosclerotic plaque bilaterally. Unchanged moderate right and mild left vertebral artery origin stenoses. Skeleton: Mild disc degeneration in the cervical spine. Asymmetrically advanced left facet arthrosis at C3-4 and C4-5 with ankylosis at the latter. Other neck: No evidence of cervical lymphadenopathy. Unchanged coarsely calcified 1.6 cm left thyroid nodule. In the setting of significant comorbidities or limited life expectancy, no follow-up recommended (ref: J Am Coll Radiol. 2015 Feb;12(2): 143-50). Upper chest: Mild-to-moderate centrilobular emphysema and minimal biapical lung scarring. Review of the MIP images confirms the above findings CTA HEAD FINDINGS Anterior circulation: The internal carotid arteries are patent from skull base to carotid termini with calcified plaque resulting in up to mild cavernous segment stenosis bilaterally, unchanged. A moderate proximal right M1 stenosis is unchanged. There is a new 1 cm long proximal right M2 occlusion with distal reconstitution. A severe stenosis was present in this location on the prior CTA. The left MCA is patent without evidence of a significant proximal stenosis. There is an unchanged severe proximal left A1 stenosis with patent but diminutive A1 segment more distally. The right A1 segment is widely patent, and the left ACA is primarily supplied by the anterior communicating artery. There are severe bilateral A2 stenoses with chronically poor opacification of the distal left A2 segment. No aneurysm is identified. Posterior circulation: The intracranial vertebral arteries are widely patent to  the basilar. Patent PICA and SCA origins are identified bilaterally. The basilar artery is widely patent. There is a moderate-sized right posterior communicating artery. Both PCAs are patent with unchanged moderate right P1 and severe left P2 stenoses. No aneurysm is identified. Venous sinuses: As permitted by contrast timing, patent. Anatomic variants: None. Review of the MIP images confirms the above findings IMPRESSION: 1. New proximal right M2 occlusion with distal reconstitution. 2. Similar appearance of advanced intracranial atherosclerosis elsewhere with severe anterior and posterior circulation stenoses as above. 3. Unchanged 50% mid right common carotid artery stenosis. 4. Patent left carotid endarterectomy without recurrent stenosis. 5. Unchanged severe right and moderate left proximal subclavian artery stenoses. 6. Unchanged moderate right and mild left vertebral artery origin stenoses. 7. Aortic Atherosclerosis (ICD10-I70.0) and Emphysema (ICD10-J43.9). Electronically Signed   By: Logan Bores M.D.   On: 10/01/2020 15:51   CT ANGIO NECK W OR WO CONTRAST  Result Date: 10/01/2020 CLINICAL DATA:  Left-sided weakness. Acute right insular infarct on CT. EXAM: CT ANGIOGRAPHY HEAD AND NECK TECHNIQUE: Multidetector CT imaging of the head and neck was performed using the standard protocol during bolus administration of intravenous contrast. Multiplanar CT image reconstructions and MIPs were obtained to evaluate the vascular anatomy. Carotid stenosis measurements (when applicable) are obtained utilizing NASCET criteria, using the distal internal carotid diameter as the denominator. CONTRAST:  24mL OMNIPAQUE IOHEXOL 350 MG/ML SOLN COMPARISON:  10/18/2019 FINDINGS: CTA NECK FINDINGS Aortic arch: Incomplete imaging of the aortic arch with prominent calcified plaque. Common origin of the brachiocephalic and left common carotid arteries, a normal variant. Similar appearance of proximal bilateral subclavian artery  stenoses due to calcified plaque, severe on the right and moderate on the left. Right carotid system: Patent with prominent, predominantly calcified plaque scattered throughout the common carotid and proximal internal carotid arteries resulting in unchanged 50% mid common carotid artery stenosis. No significant ICA stenosis. Left carotid system: Patent following endarterectomy without recurrent stenosis. Vertebral arteries: The vertebral arteries are patent and codominant with scattered atherosclerotic plaque bilaterally. Unchanged moderate right and mild left vertebral artery origin stenoses. Skeleton: Mild disc degeneration in the cervical spine. Asymmetrically advanced left facet arthrosis at C3-4 and C4-5 with ankylosis at the latter. Other neck: No evidence of cervical lymphadenopathy. Unchanged coarsely calcified 1.6 cm left thyroid nodule. In the setting of significant comorbidities or limited life expectancy, no follow-up recommended (ref: J Am Coll Radiol. 2015 Feb;12(2): 143-50). Upper chest: Mild-to-moderate centrilobular emphysema and minimal biapical lung scarring. Review of the MIP images confirms the above findings CTA HEAD FINDINGS Anterior circulation: The internal carotid arteries are patent from skull base to carotid termini with calcified plaque resulting in up to mild cavernous segment stenosis bilaterally, unchanged. A moderate proximal right M1 stenosis is unchanged. There is a new 1 cm long proximal right M2 occlusion with distal reconstitution. A severe stenosis was present in this location on the prior CTA. The left MCA is patent without evidence of a significant proximal stenosis. There is an unchanged severe proximal left A1 stenosis with patent but diminutive A1 segment more distally. The right A1 segment is widely patent, and the left ACA is primarily supplied by the anterior communicating artery. There are severe bilateral A2 stenoses with chronically poor opacification of the distal  left A2 segment. No aneurysm is identified. Posterior circulation: The intracranial vertebral arteries are widely patent to the basilar. Patent PICA and SCA origins are identified bilaterally. The basilar artery is widely patent. There is a moderate-sized right posterior communicating artery. Both PCAs are patent with unchanged moderate right P1 and severe left P2 stenoses. No aneurysm is identified. Venous sinuses: As permitted by contrast timing, patent. Anatomic variants: None. Review of the MIP images confirms the above findings IMPRESSION: 1. New proximal right M2 occlusion with distal reconstitution. 2. Similar appearance of advanced intracranial atherosclerosis elsewhere with severe anterior and posterior circulation stenoses as above. 3. Unchanged 50% mid right common carotid artery stenosis. 4. Patent left carotid endarterectomy without recurrent stenosis. 5. Unchanged severe right and moderate left proximal subclavian artery stenoses. 6. Unchanged moderate right and mild left vertebral artery origin stenoses. 7. Aortic Atherosclerosis (ICD10-I70.0) and Emphysema (ICD10-J43.9). Electronically Signed   By: Logan Bores M.D.   On: 10/01/2020 15:51   MR BRAIN WO CONTRAST  Result Date: 10/01/2020 CLINICAL DATA:  Left-sided weakness. Acute right insular infarct on CT. Right M2 occlusion on CTA. EXAM: MRI HEAD WITHOUT CONTRAST TECHNIQUE: Multiplanar, multiecho pulse sequences of the brain and surrounding structures were obtained without intravenous contrast. COMPARISON:  Head CT and CTA 10/01/2020 and MRI 10/18/2019 FINDINGS: Brain: There are multiple small acute right MCA territory infarcts with the most confluent area of infarct involving the insula and frontal operculum. Small scattered acute infarcts are noted elsewhere involving cortex and white matter of the right parietal greater than right frontal lobes. Extensive encephalomalacia anteriorly in  both frontal lobes is unchanged from the prior MRI, as  are chronic hemorrhagic infarcts in the bilateral basal ganglia. There are also unchanged small chronic infarcts in the left superior frontal gyrus and both thalami. T2 hyperintensities elsewhere in the cerebral white matter bilaterally are unchanged and nonspecific but compatible with mild chronic small vessel ischemic disease. No mass, midline shift, or extra-axial fluid collection is identified. There is ex vacuo dilatation of the lateral and third ventricles related to bifrontal encephalomalacia and global cerebral atrophy. Vascular: Abnormal FLAIR signal and right MCA branch vessels with a proximal M2 occlusion demonstrated on CTA. Skull and upper cervical spine: Bifrontal craniotomy. Sinuses/Orbits: Unremarkable orbits. Paranasal sinuses and mastoid air cells are clear. Other: None. IMPRESSION: 1. Multiple small acute right MCA territory infarcts. 2. Chronic ischemia and bifrontal encephalomalacia as detailed above. Electronically Signed   By: Logan Bores M.D.   On: 10/01/2020 18:24   CT HEAD CODE STROKE WO CONTRAST  Result Date: 10/01/2020 CLINICAL DATA:  Code stroke. 70 year old female with facial droop and left side weakness. EXAM: CT HEAD WITHOUT CONTRAST TECHNIQUE: Contiguous axial images were obtained from the base of the skull through the vertex without intravenous contrast. COMPARISON:  Brain MRI 10/18/2019.  Head CT 10/18/2019 and earlier. FINDINGS: Brain: Fairly advanced chronic encephalomalacia in both anterior frontal lobes. Ex vacuo enlargement of the frontal horns with mild additional generalized ex vacuo appearing ventricular enlargement. Superimposed abnormal hypodensity at the right insula and operculum on series 2, image 16. But elsewhere gray-white matter differentiation appears stable, with chronic encephalomalacia in the right basal ganglia. No acute intracranial hemorrhage identified. No midline shift, mass effect, or evidence of intracranial mass lesion. Vascular: Calcified  atherosclerosis at the skull base. No suspicious intracranial vascular hyperdensity. Skull: Sequelae of bifrontal craniotomy, stable. No acute osseous abnormality identified. Sinuses/Orbits: Visualized paranasal sinuses and mastoids are clear. Other: No definite gaze deviation, acute scalp soft tissue finding. ASPECTS [facility] Stroke Program Early CT Score) Total score (0-10 with 10 being normal): 8 (acutely abnormal right insula and M2 segment. Chronically abnormal right basal ganglia, anterior frontal encephalomalacia). IMPRESSION: 1. Positive for acute cortically based infarct of the right insula and operculum. ASPECTS 8. No associated hemorrhage or mass effect. 2. Underlying chronic bifrontal encephalomalacia including chronic involvement of the bilateral basal ganglia. Prior bifrontal craniotomy. 3. These results were communicated to Dr. Curly Shores at 10:56 am on 10/01/2020 by text page via the [facility] messaging system. Electronically Signed   By: Genevie Ann M.D.   On: 10/01/2020 10:57        Scheduled Meds: . aspirin  150 mg Rectal Daily  . atorvastatin  80 mg Oral q1800  . clopidogrel  75 mg Oral Daily  . donepezil  5 mg Oral QHS  . enoxaparin (LOVENOX) injection  40 mg Subcutaneous Q24H  . melatonin  3 mg Oral QHS  . sodium chloride flush  3 mL Intravenous Once   Continuous Infusions: . sodium chloride 100 mL/hr at 10/02/20 0600  . levETIRAcetam 750 mg (10/02/20 4825)          Aline August, MD [facility] 10/02/2020, 9:55 AM

## 2020-10-02 NOTE — Progress Notes (Signed)
OT Cancellation Note  Patient Details Name: Kristy Cabrera MRN: [id number] DOB: [birth year]   Cancelled Treatment:    Reason Eval/Treat Not Completed: Patient declined, no reason specified;Other (comment) (Pt had just worked with physical therapy and required a rest break. OT to attempt next available treatment time.)   Jefferey Pica, OTR/L Acute Rehabilitation Services Pager: [phone] Office: [phone]   [name] C 10/02/2020, 11:56 AM

## 2020-10-02 NOTE — Evaluation (Signed)
Clinical/Bedside Swallow Evaluation Patient Details  Name: Kristy Cabrera MRN: [id number] Date of Birth: March 18, 1951  Today's Date: 10/02/2020 Time: SLP Start Time (ACUTE ONLY): 1054 SLP Stop Time (ACUTE ONLY): 1105 SLP Time Calculation (min) (ACUTE ONLY): 11 min  Past Medical History:  Past Medical History:  Diagnosis Date  . Brain tumor (benign) ([place])   . Dementia ([place])   . Episodic mood disorder ([place])   . Hyperlipidemia   . Hypertension   . Insomnia   . Osteopenia   . Weakness of both legs    Past Surgical History:  Past Surgical History:  Procedure Laterality Date  . Brain tumor removal    . ENDARTERECTOMY Left 10/28/2018   Procedure: Left Carotid ENDARTERECTOMY;  Surgeon: [name], MD;  Location: [facility];  Service: Vascular;  Laterality: Left;  Marland Kitchen MELANOMA EXCISION  1987   BACK   HPI:  70 y.o. female with medical history significant for advanced dementia, HTN, meningioma s/p resection, seizure, unspecified left MCA stroke s/p left-sided endarterectomy in 2020, HLD, currently enrolled in home hospice presented with new onset of left facial droop, left-sided weakness and slurred speech.  CT of the head showed acute stroke on right insula and operculum. PTA pt walked 53' with rollator, I with bathing/dressing, fed herself regular diet.   Assessment / Plan / Recommendation Clinical Impression  Pt presents with a mild dysphagia c/b focal left CN VII involvement, intermittent oral holding and suspected delays in swallow initiation.  There were occasional incidents of mild coughing initially, but as session progressed overall efficiency of swallow and airway protection appeared to improve subjectively.  Pt needed assistance to hold the cup/utensils.  Spoke with [name] over the phone after the assessment - he stated that his wife has an incredible appetite and hasn't had issues with self-feeding/swalloiwng in the past.  For today, recommend starting with a dysphagia 1 diet, thin  liquids, meds whole in puree.  SLP will follow for diet progression/safety.  Spouse agrees with plan. SLP Visit Diagnosis: Dysphagia, oropharyngeal phase (R13.12)    Aspiration Risk  Mild aspiration risk    Diet Recommendation   dysphagia 1, thin liquids  Medication Administration: Whole meds with puree    Other  Recommendations Oral Care Recommendations: Oral care BID   Follow up Recommendations Other (comment) (tba)      Frequency and Duration min 2x/week  2 weeks       Prognosis Prognosis for Safe Diet Advancement: Good      Swallow Study   General Date of Onset: 10/01/20 HPI: 70 y.o. female with medical history significant for advanced dementia, HTN, meningioma s/p resection, seizure, unspecified left MCA stroke s/p left-sided endarterectomy in 2020, HLD, currently enrolled in home hospice presented with new onset of left facial droop, left-sided weakness and slurred speech.  CT of the head showed acute stroke on right insula and operculum. PTA pt walked 59' with rollator, I with bathing/dressing, fed herself regular diet. Type of Study: Bedside Swallow Evaluation Previous Swallow Assessment: 10/2018 - no dysphagia Diet Prior to this Study: NPO Temperature Spikes Noted: No Respiratory Status: Room air History of Recent Intubation: No Behavior/Cognition: Alert Oral Cavity Assessment: Within Functional Limits Oral Care Completed by SLP: Recent completion by staff Oral Cavity - Dentition: Missing dentition Vision: Functional for self-feeding Self-Feeding Abilities: Needs assist Patient Positioning: Upright in bed Baseline Vocal Quality: Normal Volitional Cough: Strong Volitional Swallow: Able to elicit    Oral/Motor/Sensory Function Overall Oral Motor/Sensory Function: Moderate impairment Facial  ROM: Reduced left;Suspected CN VII (facial) dysfunction Facial Symmetry: Abnormal symmetry left;Suspected CN VII (facial) dysfunction Facial Strength: Suspected CN VII (facial)  dysfunction;Reduced left Lingual ROM: Within Functional Limits Lingual Symmetry: Within Functional Limits Mandible: Within Functional Limits   Ice Chips Ice chips: Within functional limits   Thin Liquid Thin Liquid: Within functional limits    Nectar Thick Nectar Thick Liquid: Not tested   Honey Thick Honey Thick Liquid: Not tested   Puree Puree: Within functional limits   Solid     Solid: Not tested      [name] 10/02/2020,11:46 AM    Estill Bamberg L. Tivis Ringer, [place] Office number [phone] Pager [phone]

## 2020-10-02 NOTE — Evaluation (Signed)
Physical Therapy Evaluation Patient Details Name: Kristy Cabrera MRN: [id number] DOB: 1951-02-27 Today's Date: 10/02/2020   History of Present Illness  70 y.o. female with medical history significant for advanced dementia, HTN, meningioma s/p resection, seizure, unspecified left MCA stroke s/p left-sided endarterectomy in 2020, HLD, currently enrolled in home hospice presented with new onset of left facial droop, left-sided weakness and slurred speech.  CT of the head showed acute stroke on right insula and operculum.    Clinical Impression  Pt admitted with above diagnosis. PTA pt lived at home with her husband. She was able to ambulate 27' with rollator and was mod I dressing/bathing. Pt is active with home hospice for advanced dementia. On eval, pt required min/mod assist bed mobility and mod assist sit to stand. Pt was sleeping on arrival and continually attempting to return to bed/sleep during eval. This most likely impacted the amount of assist she required to attain standing at bedside. Therapist was unable to get pt to participate in ambulation. Pt will benefit from skilled PT to increase their independence and safety with mobility to allow discharge to the venue listed below.  PT to further assess mobility status during hospitalization.      Follow Up Recommendations SNF;Supervision/Assistance - 24 hour    Equipment Recommendations  None recommended by PT    Recommendations for Other Services       Precautions / Restrictions Precautions Precautions: Fall;Other (comment) Precaution Comments: advanced dementia      Mobility  Bed Mobility Overal bed mobility: Needs Assistance Bed Mobility: Supine to Sit;Sit to Supine     Supine to sit: Mod assist;HOB elevated Sit to supine: Min assist;HOB elevated   General bed mobility comments: +rail, increased assist for sup to sit due to pt's desire to stay in the bed    Transfers Overall transfer level: Needs assistance Equipment  used: 1 person hand held assist Transfers: Sit to/from Stand Sit to Stand: Mod assist         General transfer comment: mod assist to power up and stabilize balance. Encouragement needed to stand due to pt wanting to return to bed.  Ambulation/Gait             General Gait Details: unable to get pt to progress amb  Stairs            Wheelchair Mobility    Modified Rankin (Stroke Patients Only) Modified Rankin (Stroke Patients Only) Pre-Morbid Rankin Score: Moderate disability Modified Rankin: Moderately severe disability     Balance Overall balance assessment: Needs assistance Sitting-balance support: No upper extremity supported;Feet supported Sitting balance-Leahy Scale: Good     Standing balance support: Bilateral upper extremity supported;During functional activity Standing balance-Leahy Scale: Poor Standing balance comment: reliant on external support                             Pertinent Vitals/Pain Pain Assessment: Faces Faces Pain Scale: Hurts a little bit Pain Location: generalized with mobility Pain Descriptors / Indicators: Discomfort Pain Intervention(s): Repositioned;Limited activity within patient's tolerance    Home Living Family/patient expects to be discharged to:: Private residence Living Arrangements: Spouse/significant other Available Help at Discharge: Family;Available 24 hours/day Type of Home: House Home Access: Stairs to enter Entrance Stairs-Rails: Psychiatric nurse of Steps: 4 Home Layout: Two level;Able to live on main level with bedroom/bathroom Home Equipment: Gilford Rile - 2 wheels;Shower seat - built in;Walker - 4 wheels Additional Comments: Pt on home hospice  for advanced dementia.    Prior Function Level of Independence: Needs assistance   Gait / Transfers Assistance Needed: amb 90' with rollator  ADL's / Homemaking Assistance Needed: mod I dressing/bathing and self feeding  Comments: Home  set up taken from previous admission. PLOF provided by husband to SLP via phone.     Hand Dominance   Dominant Hand: Right    Extremity/Trunk Assessment   Upper Extremity Assessment Upper Extremity Assessment: Defer to OT evaluation    Lower Extremity Assessment Lower Extremity Assessment: Generalized weakness (Pt not following directions for MMT. Unable to discern symmetry.)    Cervical / Trunk Assessment Cervical / Trunk Assessment: Kyphotic  Communication   Communication: HOH  Cognition Arousal/Alertness: Awake/alert Behavior During Therapy: WFL for tasks assessed/performed Overall Cognitive Status: No family/caregiver present to determine baseline cognitive functioning                                 General Comments: advanced dementia at baseline. Following simple commands 75% of time.      General Comments General comments (skin integrity, edema, etc.): Pt sleeping on arrival. Able to wake her for brief participation in eval, but continually attempting to return to bed/sleeping.    Exercises     Assessment/Plan    PT Assessment Patient needs continued PT services  PT Problem List Decreased strength;Decreased mobility;Decreased safety awareness;Decreased activity tolerance;Decreased balance;Decreased cognition       PT Treatment Interventions Therapeutic activities;Gait training;Therapeutic exercise;Patient/family education;Balance training;Functional mobility training    PT Goals (Current goals can be found in the Care Plan section)  Acute Rehab PT Goals Patient Stated Goal: unable to state PT Goal Formulation: Patient unable to participate in goal setting Time For Goal Achievement: 10/16/20 Potential to Achieve Goals: Fair    Frequency Min 3X/week   Barriers to discharge        Co-evaluation               AM-PAC PT "6 Clicks" Mobility  Outcome Measure Help needed turning from your back to your side while in a flat bed without  using bedrails?: None Help needed moving from lying on your back to sitting on the side of a flat bed without using bedrails?: A Lot Help needed moving to and from a bed to a chair (including a wheelchair)?: A Lot Help needed standing up from a chair using your arms (e.g., wheelchair or bedside chair)?: A Lot Help needed to walk in hospital room?: A Lot Help needed climbing 3-5 steps with a railing? : Total 6 Click Score: 13    End of Session   Activity Tolerance: Patient limited by fatigue Patient left: in bed;with call bell/phone within reach;with bed alarm set Nurse Communication: Mobility status PT Visit Diagnosis: Muscle weakness (generalized) (M62.81);Difficulty in walking, not elsewhere classified (R26.2)    Time: 6967-8938 PT Time Calculation (min) (ACUTE ONLY): 11 min   Charges:   PT Evaluation $PT Eval Moderate Complexity: 1 Mod          Lorrin Goodell, PT  Office # [phone] Pager [phone]   Kristy Cabrera 10/02/2020, 12:45 PM

## 2020-10-02 NOTE — Evaluation (Signed)
Speech Language Pathology Evaluation Patient Details Name: Kristy Cabrera MRN: [id number] DOB: 03-01-1951 Today's Date: 10/02/2020 Time: 3299-2426 SLP Time Calculation (min) (ACUTE ONLY): 10 min  Problem List:  Patient Active Problem List   Diagnosis Date Noted  . Stroke (cerebrum) ([place]) 10/01/2020  . Subclinical hyperthyroidism 10/31/2018  . History of stroke 10/31/2018  . Leukocytosis 10/31/2018  . Syncope and collapse 10/30/2018  . Stenosis of internal carotid artery with cerebral infarction, left ([place]) 10/24/2018  . Seizure disorder ([place]) 10/24/2018  . COPD (chronic obstructive pulmonary disease) ([place]) 10/24/2018  . Syncope 10/24/2018  . Acute ischemic stroke ([place]) 10/24/2018  . Cognitive impairment 10/24/2018  . Confusion   . Acute hypokalemia   . Hydrochlorothiazide adverse reaction   . Hyponatremia 07/04/2016  . Acute hyponatremia   . Alcohol abuse   . Alcohol withdrawal syndrome with complication ([place])   . Acute encephalopathy   . Essential hypertension 07/14/2014  . Hearing loss of left ear 07/14/2014  . Atypical meningioma of brain ([place]) 06/29/2014   Past Medical History:  Past Medical History:  Diagnosis Date  . Brain tumor (benign) ([place])   . Dementia ([place])   . Episodic mood disorder ([place])   . Hyperlipidemia   . Hypertension   . Insomnia   . Osteopenia   . Weakness of both legs    Past Surgical History:  Past Surgical History:  Procedure Laterality Date  . Brain tumor removal    . ENDARTERECTOMY Left 10/28/2018   Procedure: Left Carotid ENDARTERECTOMY;  Surgeon: [name], MD;  Location: [facility];  Service: Vascular;  Laterality: Left;  Marland Kitchen MELANOMA EXCISION  1987   BACK   HPI:  70 y.o. female with medical history significant for advanced dementia, HTN, meningioma s/p resection, seizure, unspecified left MCA stroke s/p left-sided endarterectomy in 2020, HLD, currently enrolled in home hospice presented with new onset of left facial droop, left-sided  weakness and slurred speech.  CT of the head showed acute stroke on right insula and operculum. PTA pt walked 56' with rollator, I with bathing/dressing, fed herself regular diet.   Assessment / Plan / Recommendation Clinical Impression  Pt presents with a significant dysarthria of speech, impacting intelligibility. Receptive language was marked by inconsistent command-following; yes/no reliability for basic needs, biographical information was [facility].  Pt engaged in basic naming, repetition, making needs known without deficit.  Speech output was fluent, however articulatory accuracy was impacted as described above.  Pt's husband describes baseline speech as being low in volume, but clear with accurate content.  SLP will follow to address speech clarity and to further investigate cognition function.  Her husband agrees with plan.    SLP Assessment  SLP Recommendation/Assessment: Patient needs continued Speech Lanaguage Pathology Services SLP Visit Diagnosis: Cognitive communication deficit (R41.841)    Follow Up Recommendations  Other (comment) (tba)    Frequency and Duration min 2x/week  2 weeks      SLP Evaluation Cognition  Overall Cognitive Status: Impaired/Different from baseline Arousal/Alertness: Awake/alert Orientation Level: Oriented to person;Oriented to place;Disoriented to time Attention: Sustained Sustained Attention: Appears intact Awareness: Impaired       Comprehension  Auditory Comprehension Overall Auditory Comprehension: Impaired Yes/No Questions: Within Functional Limits Commands: Impaired One Step Basic Commands: 75-100% accurate Two Step Basic Commands: 50-74% accurate Visual Recognition/Discrimination Discrimination: Not tested Reading Comprehension Reading Status: Not tested    Expression Expression Primary Mode of Expression: Verbal Verbal Expression Overall Verbal Expression: Impaired Initiation: No impairment Automatic Speech: Name;Social  Response;Counting  Level of Generative/Spontaneous Verbalization: Sentence Repetition: No impairment Naming: No impairment Pragmatics: No impairment Non-Verbal Means of Communication: Gestures Written Expression Dominant Hand: Right Written Expression: Not tested   Oral / Motor  Oral Motor/Sensory Function Overall Oral Motor/Sensory Function: Moderate impairment Facial ROM: Reduced left;Suspected CN VII (facial) dysfunction Facial Symmetry: Abnormal symmetry left;Suspected CN VII (facial) dysfunction Facial Strength: Suspected CN VII (facial) dysfunction;Reduced left Lingual ROM: Within Functional Limits Lingual Symmetry: Within Functional Limits Mandible: Within Functional Limits Motor Speech Overall Motor Speech: Impaired Respiration: Impaired Level of Impairment: Phrase Phonation: Normal Articulation: Impaired Level of Impairment: Word Intelligibility: Intelligibility reduced Word: 50-74% accurate Phrase: 50-74% accurate Sentence: 50-74% accurate Motor Planning: Witnin functional limits   GO                    Kristy Cabrera 10/02/2020, 1:04 PM  [place] Kristy Cabrera, [place] Office number [phone] Pager [phone]

## 2020-10-02 NOTE — Progress Notes (Signed)
Pt HR sustaining in mid-high 50s while sleeping. HR increases with arousal but soon returns to 50s. Pt VS otherwise unchanged. No change in LOC. Pt does not appear to be symptomatic or in distress. MD updated.

## 2020-10-03 DIAGNOSIS — I639 Cerebral infarction, unspecified: Secondary | ICD-10-CM | POA: Diagnosis not present

## 2020-10-03 DIAGNOSIS — R1310 Dysphagia, unspecified: Secondary | ICD-10-CM

## 2020-10-03 DIAGNOSIS — Z7189 Other specified counseling: Secondary | ICD-10-CM

## 2020-10-03 DIAGNOSIS — Z515 Encounter for palliative care: Secondary | ICD-10-CM

## 2020-10-03 DIAGNOSIS — F0391 Unspecified dementia with behavioral disturbance: Secondary | ICD-10-CM | POA: Diagnosis not present

## 2020-10-03 DIAGNOSIS — F0390 Unspecified dementia without behavioral disturbance: Secondary | ICD-10-CM

## 2020-10-03 LAB — CBC WITH DIFFERENTIAL/PLATELET
Abs Immature Granulocytes: 0.03 10*3/uL (ref 0.00–0.07)
Basophils Absolute: 0.1 10*3/uL (ref 0.0–0.1)
Basophils Relative: 1 %
Eosinophils Absolute: 0.2 10*3/uL (ref 0.0–0.5)
Eosinophils Relative: 2 %
HCT: 39.5 % (ref 36.0–46.0)
Hemoglobin: 13.2 g/dL (ref 12.0–15.0)
Immature Granulocytes: 0 %
Lymphocytes Relative: 27 %
Lymphs Abs: 2.5 10*3/uL (ref 0.7–4.0)
MCH: 29.5 pg (ref 26.0–34.0)
MCHC: 33.4 g/dL (ref 30.0–36.0)
MCV: 88.4 fL (ref 80.0–100.0)
Monocytes Absolute: 0.8 10*3/uL (ref 0.1–1.0)
Monocytes Relative: 8 %
Neutro Abs: 5.8 10*3/uL (ref 1.7–7.7)
Neutrophils Relative %: 62 %
Platelets: 301 10*3/uL (ref 150–400)
RBC: 4.47 MIL/uL (ref 3.87–5.11)
RDW: 12.8 % (ref 11.5–15.5)
WBC: 9.3 10*3/uL (ref 4.0–10.5)
nRBC: 0 % (ref 0.0–0.2)

## 2020-10-03 LAB — COMPREHENSIVE METABOLIC PANEL
ALT: 13 U/L (ref 0–44)
AST: 19 U/L (ref 15–41)
Albumin: 3.3 g/dL — ABNORMAL LOW (ref 3.5–5.0)
Alkaline Phosphatase: 84 U/L (ref 38–126)
Anion gap: 9 (ref 5–15)
BUN: 14 mg/dL (ref 8–23)
CO2: 20 mmol/L — ABNORMAL LOW (ref 22–32)
Calcium: 9.2 mg/dL (ref 8.9–10.3)
Chloride: 109 mmol/L (ref 98–111)
Creatinine, Ser: 0.56 mg/dL (ref 0.44–1.00)
GFR, Estimated: >60 mL/min (ref >60)
Glucose, Bld: 72 mg/dL (ref 70–99)
Potassium: 3.6 mmol/L (ref 3.5–5.1)
Sodium: 138 mmol/L (ref 135–145)
Total Bilirubin: 0.9 mg/dL (ref 0.3–1.2)
Total Protein: 6.9 g/dL (ref 6.5–8.1)

## 2020-10-03 LAB — MAGNESIUM: Magnesium: 1.8 mg/dL (ref 1.7–2.4)

## 2020-10-03 MED ORDER — ENSURE ENLIVE PO LIQD
237.0000 mL | Freq: Three times a day (TID) | ORAL | Status: DC
  Administered 2020-10-03 – 2020-10-17 (×31): 237 mL via ORAL
  Filled 2020-10-03 (×2): qty 237

## 2020-10-03 MED ORDER — ASPIRIN EC 81 MG PO TBEC
81.0000 mg | DELAYED_RELEASE_TABLET | Freq: Every day | ORAL | Status: DC
  Administered 2020-10-04 – 2020-11-06 (×34): 81 mg via ORAL
  Filled 2020-10-03 (×34): qty 1

## 2020-10-03 MED ORDER — ADULT MULTIVITAMIN W/MINERALS CH
1.0000 | ORAL_TABLET | Freq: Every day | ORAL | Status: DC
  Administered 2020-10-03 – 2020-11-10 (×39): 1 via ORAL
  Filled 2020-10-03 (×39): qty 1

## 2020-10-03 MED ORDER — ASPIRIN EC 81 MG PO TBEC: 81.0000 mg | DELAYED_RELEASE_TABLET | Freq: Every day | ORAL | Status: DC

## 2020-10-03 MED ORDER — LEVETIRACETAM 750 MG PO TABS
750.0000 mg | ORAL_TABLET | Freq: Two times a day (BID) | ORAL | Status: DC
  Filled 2020-10-03: qty 1

## 2020-10-03 MED ORDER — LEVETIRACETAM 750 MG PO TABS
750.0000 mg | ORAL_TABLET | Freq: Two times a day (BID) | ORAL | Status: DC
  Administered 2020-10-03 – 2020-11-04 (×64): 750 mg via ORAL
  Filled 2020-10-03 (×66): qty 1

## 2020-10-03 NOTE — Progress Notes (Signed)
Daily Progress Note   Patient Name: Kristy Cabrera       Date: 10/03/2020 DOB: December 25, 1950  Age: 70 y.o. MRN#: [id number] Attending Physician: Aline August, MD Primary Care Physician: [name], NP Admit Date: 10/01/2020  Reason for Consultation/Follow-up: goals of care, patient has been under hospice care at home  Subjective: Chart reviewed - note SLP evalation done yesterday and found patient to have mild aspiration; recommended dysphagia 1 diet with thin liquids.  Received report from primary RN - patient has been eating well. No acute concerns.   Spoke with husband/Joseph by phone. Discussed the results of SLP evaluation and her current intake. He reports her intake at home has never been an issue, stating "she eats like a horse". Discussed that with advanced dementia, this would change at some point in the future. Kristy Cabrera shares that for the past 3-4 months, Kristy Cabrera spends 75% of her day in bed/sleeeping.   Provided education regarding the diagnosis of dementia and its natural trajectory, emphasizing it is a progressive and ultimately terminal illness. Discussed that as it progresses, dementia results in decreased ability to communicate, ambulate, swallow, and maintain continence. Joseph verbalizes understanding. I confirmed that plan of care is for discharge to SNF for rehab, and then returning home under hospice care.   Length of Stay: 2  Current Medications: Scheduled Meds:  . [START ON 10/04/2020] aspirin EC  81 mg Oral Daily  . atorvastatin  80 mg Oral q1800  . clopidogrel  75 mg Oral Daily  . donepezil  5 mg Oral QHS  . enoxaparin (LOVENOX) injection  40 mg Subcutaneous Q24H  . feeding supplement  237 mL Oral TID BM  . levETIRAcetam  750 mg Oral BID  . melatonin  3 mg  Oral QHS  . multivitamin with minerals  1 tablet Oral Daily  . sodium chloride flush  3 mL Intravenous Once    Continuous Infusions:   PRN Meds: acetaminophen **OR** acetaminophen (TYLENOL) oral liquid 160 mg/5 mL **OR** acetaminophen, hydrALAZINE, LORazepam, senna-docusate  Physical Exam Vitals reviewed.  Constitutional:      General: She is sleeping. She is not in acute distress.    Comments: Frail, chronically ill-appearing  Pulmonary:     Effort: Pulmonary effort is normal.  Vital Signs: BP 130/71   Pulse 68   Temp 98.3 F (36.8 C) (Oral)   Resp 17   Ht 5\' 4"  (1.626 m)   Wt 64 kg   SpO2 97%   BMI 24.20 kg/m  SpO2: SpO2: 97 % O2 Device: O2 Device: Room Air O2 Flow Rate:    Intake/output summary:   Intake/Output Summary (Last 24 hours) at 10/03/2020 1801 Last data filed at 10/03/2020 0402 Gross per 24 hour  Intake 1449.2 ml  Output 1000 ml  Net 449.2 ml   LBM: Last BM Date:  (pta) Baseline Weight: Weight: 64 kg Most recent weight: Weight: 64 kg       Palliative Assessment/Data: PPS 40%       Palliative Care Assessment & Plan   HPI/Patient Profile: 70 y.o. female  with past medical history of  HTN, HLD, carotid stenosis (s/p left CEA 10/28/2018 with residual aphasia, seizure disorder, meningioma s/p resection, and dementia presented to the ED on 10/01/20 from home with husband concerns of left sided weakness, facial droop, and slurred speech. Husband reported that patient was started home hospice 4 to 5 months ago due to progressing dementia. Patient was admitted on 10/01/2020 with acute right side stroke, seizure disorder, advanced dementia.   ED Course:Code stroke was called, initially was considered to have breakthrough seizure and Keppra loading was done. However CT headacute stroke on right insula andoperculum.  Assessment: - acute right sided stroke - advanced dementia - seizure disorder - dysphagia with mild aspiration  risk  Recommendations/Plan:  DNR/DNI as previously documented  Continue current medical care  Disposition plan is for SNF/rehab  After discharge from rehab, patient will return home under hospice care with [facility] hospice continues to be appropriate level of care due to advanced dementia and overall debility  Will attempt to complete MOST form with husband prior to discharge  Goals of Care and Additional Recommendations:  Limitations on Scope of Treatment: Full Scope Treatment  Code Status: DNR/DNI  Prognosis:   < 6 months  Discharge Planning:  SNF/rehab  Care plan was discussed with primary RN  Thank you for allowing the Palliative Medicine Team to assist in the care of this patient.   Total Time 25 minutes Prolonged Time Billed  no       Greater than 50%  of this time was spent counseling and coordinating care related to the above assessment and plan.  Lavena Bullion, NP  Please contact Palliative Medicine Team phone at [phone] for questions and concerns.

## 2020-10-03 NOTE — Progress Notes (Signed)
   This pt is an active current pt under hospice services with [facility]. She was admitted to our services on 09-19-2020 for a primary diagnosis of sequelae of cerebral infarction.  We have been discussing goals with the pt's husband. He is unable to care for the pt at home if she is not somewhat back to her baseline functionally. He has been involved with how she is doing in hospital and would like to proceed with her going to a SNF for rehab if eligible. He understands and has been explained the impact this has on hospice services and they can not be done concurrently. We will follow throughout hospitalization in event pt has furher decline and needs different services. I have reached out to the [facility] with transitions of care department to update her and she will begin d/c process after speaking with husband to confirm the above.    Webb Silversmith RN [phone]

## 2020-10-03 NOTE — Progress Notes (Signed)
Patient ID: Kristy Cabrera, female   DOB: August 02, 1950, 70 y.o.   MRN: [id number]  PROGRESS NOTE    Gaytha Raybourn  QJJ:[id number] DOB: 29-Jul-1950 DOA: 10/01/2020 PCP: [name], NP   Brief Narrative:  70 y.o. female with medical history significant of advanced dementia, HTN, meningioma s/p resection, seizure, unspecified left MCA stroke s/p left-sided endarterectomy in 2020, HLD, currently enrolled in home hospice presented with new onset of left facial droop, left-sided weakness and slurred speech.  On presentation code stroke was called.  CT of the head showed acute stroke on right insula and operculum.  Neurology was consulted.  Assessment & Plan:   Acute right-sided stroke -Presented with left-sided facial droop, left-sided weakness and slurred speech.  CT of the head was showing acute stroke in right insula and operculum.  MRI of brain showed multiple small acute right MCA territory infarcts.  CTA head showed new proximal right M2 occlusion with distal reconstitution with severe intracranial atherosclerosis and unchanged 50% mid right common carotid artery stenosis and patent left carotid endarterectomy without recurrent stenosis -Continue neurochecks.  Continue aspirin, statin and Plavix.  Neurology evaluation appreciated. -PT/OT recommend SNF placement.  Social worker consult -Diet as per SLP recommendations  -Echo shows EF of 55 to 60% with grade 1 diastolic dysfunction. -Antihypertensives being on hold for now.  Allow for permissive hypertension -A1c 5.7 -LDL 153 and cholesterol 220  Advanced dementia -Continue Aricept. -Fall precautions.  Delirium precautions. -Patient is enrolled in home hospice.  Palliative care consultation appreciated.  If feeding becomes an issue, will involve palliative care again.  Seizure disorder -Continue Keppra, currently getting IV.  We will switch back to oral Keppra if there is no problem with oral intake.  Hyperlipidemia -Continue  statin  Mild leukocytosis -Resolved   DVT prophylaxis: Lovenox Code Status: DNR Family Communication: None at bedside Disposition Plan: Status is: Inpatient  Remains inpatient appropriate because:Inpatient level of care appropriate due to severity of illness   Dispo: The patient is from: Home              Anticipated d/c is to: SNF               Patient currently is medically stable to d/c.   Difficult to place patient No  Consultants: Neurology/palliative care  Procedures: Echo as below  Antimicrobials: None   Subjective: Patient seen and examined at bedside.  No overnight vomiting, fever or seizures reported.  Poor historian; confused. Objective: Vitals:   10/02/20 1656 10/02/20 2056 10/03/20 0024 10/03/20 0349  BP: 90/77 (!) 155/93 136/72 122/80  Pulse: 79 78 76 68  Resp:  20 16 20   Temp: 98 F (36.7 C) 99.7 F (37.6 C) 98.6 F (37 C) 97.7 F (36.5 C)  TempSrc:   Oral   SpO2: 96% 98% 94% 95%  Weight:      Height:        Intake/Output Summary (Last 24 hours) at 10/03/2020 0740 Last data filed at 10/03/2020 0402 Gross per 24 hour  Intake 1689.2 ml  Output 1000 ml  Net 689.2 ml   Filed Weights   10/01/20 1338  Weight: 64 kg    Examination:  General exam: Chronically ill and deconditioned female lying in bed.  On room air currently.  Awake but confused  respiratory system: Decreased breath sounds at bases bilaterally with some scattered crackles  cardiovascular system: Rate controlled, S1-S2 heard Gastrointestinal system: Abdomen is slightly distended, soft and nontender.  Bowel sounds are heard  extremities: Mild lower extremity edema bilaterally; no clubbing    Data Reviewed: I have personally reviewed following labs and imaging studies  CBC: Recent Labs  Lab 10/01/20 1044 10/03/20 0224  WBC 11.9* 9.3  NEUTROABS 8.0* 5.8  HGB 15.3* 13.2  HCT 46.4* 39.5  MCV 91.9 88.4  PLT 259 409   Basic Metabolic Panel: Recent Labs  Lab  10/01/20 1044 10/03/20 0224  NA 137 138  K 3.5 3.6  CL 104 109  CO2 21* 20*  GLUCOSE 124* 72  BUN 17 14  CREATININE 0.86 0.56  CALCIUM 9.7 9.2  MG  --  1.8   GFR: Estimated Creatinine Clearance: 56.5 mL/min (by C-G formula based on SCr of 0.56 mg/dL). Liver Function Tests: Recent Labs  Lab 10/01/20 1044 10/03/20 0224  AST 23 19  ALT 15 13  ALKPHOS 102 84  BILITOT 0.8 0.9  PROT 8.0 6.9  ALBUMIN 4.1 3.3*   No results for input(s): LIPASE, AMYLASE in the last 168 hours. No results for input(s): AMMONIA in the last 168 hours. Coagulation Profile: Recent Labs  Lab 10/01/20 1044  INR 0.9   Cardiac Enzymes: No results for input(s): CKTOTAL, CKMB, CKMBINDEX, TROPONINI in the last 168 hours. BNP (last 3 results) No results for input(s): PROBNP in the last 8760 hours. HbA1C: Recent Labs    10/02/20 0342  HGBA1C 5.7*   CBG: Recent Labs  Lab 10/01/20 1039  GLUCAP 117*   Lipid Profile: Recent Labs    10/02/20 0342  CHOL 220*  HDL 53  LDLCALC 153*  TRIG 72  CHOLHDL 4.2   Thyroid Function Tests: No results for input(s): TSH, T4TOTAL, FREET4, T3FREE, THYROIDAB in the last 72 hours. Anemia Panel: No results for input(s): VITAMINB12, FOLATE, FERRITIN, TIBC, IRON, RETICCTPCT in the last 72 hours. Sepsis Labs: No results for input(s): PROCALCITON, LATICACIDVEN in the last 168 hours.  Recent Results (from the past 240 hour(s))  SARS CORONAVIRUS 2 (TAT 6-24 HRS) Nasopharyngeal Nasopharyngeal Swab     Status: None   Collection Time: 10/01/20  2:21 PM   Specimen: Nasopharyngeal Swab  Result Value Ref Range Status   SARS Coronavirus 2 NEGATIVE NEGATIVE Final    Comment: (NOTE) SARS-CoV-2 target nucleic acids are NOT DETECTED.  The SARS-CoV-2 RNA is generally detectable in upper and lower respiratory specimens during the acute phase of infection. Negative results do not preclude SARS-CoV-2 infection, do not rule out co-infections with other pathogens, and should  not be used as the sole basis for treatment or other patient management decisions. Negative results must be combined with clinical observations, patient history, and epidemiological information. The expected result is Negative.  Fact Sheet for Patients: SugarRoll.be  Fact Sheet for Healthcare Providers: https://www.woods-mathews.com/  This test is not yet approved or cleared by the [place] FDA and  has been authorized for detection and/or diagnosis of SARS-CoV-2 by FDA under an Emergency Use Authorization (EUA). This EUA will remain  in effect (meaning this test can be used) for the duration of the COVID-19 declaration under Se ction 564(b)(1) of the Act, 21 U.S.C. section 360bbb-3(b)(1), unless the authorization is terminated or revoked sooner.  Performed at [facility], [place] [address], [place], [place] [address]          Radiology Studies: CT ANGIO HEAD W OR WO CONTRAST  Result Date: 10/01/2020 CLINICAL DATA:  Left-sided weakness. Acute right insular infarct on CT. EXAM: CT ANGIOGRAPHY HEAD AND NECK TECHNIQUE: Multidetector CT imaging of the head  and neck was performed using the standard protocol during bolus administration of intravenous contrast. Multiplanar CT image reconstructions and MIPs were obtained to evaluate the vascular anatomy. Carotid stenosis measurements (when applicable) are obtained utilizing NASCET criteria, using the distal internal carotid diameter as the denominator. CONTRAST:  61mL OMNIPAQUE IOHEXOL 350 MG/ML SOLN COMPARISON:  10/18/2019 FINDINGS: CTA NECK FINDINGS Aortic arch: Incomplete imaging of the aortic arch with prominent calcified plaque. Common origin of the brachiocephalic and left common carotid arteries, a normal variant. Similar appearance of proximal bilateral subclavian artery stenoses due to calcified plaque, severe on the right and moderate on the left. Right carotid system: Patent with  prominent, predominantly calcified plaque scattered throughout the common carotid and proximal internal carotid arteries resulting in unchanged 50% mid common carotid artery stenosis. No significant ICA stenosis. Left carotid system: Patent following endarterectomy without recurrent stenosis. Vertebral arteries: The vertebral arteries are patent and codominant with scattered atherosclerotic plaque bilaterally. Unchanged moderate right and mild left vertebral artery origin stenoses. Skeleton: Mild disc degeneration in the cervical spine. Asymmetrically advanced left facet arthrosis at C3-4 and C4-5 with ankylosis at the latter. Other neck: No evidence of cervical lymphadenopathy. Unchanged coarsely calcified 1.6 cm left thyroid nodule. In the setting of significant comorbidities or limited life expectancy, no follow-up recommended (ref: J Am Coll Radiol. 2015 Feb;12(2): 143-50). Upper chest: Mild-to-moderate centrilobular emphysema and minimal biapical lung scarring. Review of the MIP images confirms the above findings CTA HEAD FINDINGS Anterior circulation: The internal carotid arteries are patent from skull base to carotid termini with calcified plaque resulting in up to mild cavernous segment stenosis bilaterally, unchanged. A moderate proximal right M1 stenosis is unchanged. There is a new 1 cm long proximal right M2 occlusion with distal reconstitution. A severe stenosis was present in this location on the prior CTA. The left MCA is patent without evidence of a significant proximal stenosis. There is an unchanged severe proximal left A1 stenosis with patent but diminutive A1 segment more distally. The right A1 segment is widely patent, and the left ACA is primarily supplied by the anterior communicating artery. There are severe bilateral A2 stenoses with chronically poor opacification of the distal left A2 segment. No aneurysm is identified. Posterior circulation: The intracranial vertebral arteries are widely  patent to the basilar. Patent PICA and SCA origins are identified bilaterally. The basilar artery is widely patent. There is a moderate-sized right posterior communicating artery. Both PCAs are patent with unchanged moderate right P1 and severe left P2 stenoses. No aneurysm is identified. Venous sinuses: As permitted by contrast timing, patent. Anatomic variants: None. Review of the MIP images confirms the above findings IMPRESSION: 1. New proximal right M2 occlusion with distal reconstitution. 2. Similar appearance of advanced intracranial atherosclerosis elsewhere with severe anterior and posterior circulation stenoses as above. 3. Unchanged 50% mid right common carotid artery stenosis. 4. Patent left carotid endarterectomy without recurrent stenosis. 5. Unchanged severe right and moderate left proximal subclavian artery stenoses. 6. Unchanged moderate right and mild left vertebral artery origin stenoses. 7. Aortic Atherosclerosis (ICD10-I70.0) and Emphysema (ICD10-J43.9). Electronically Signed   By: Logan Bores M.D.   On: 10/01/2020 15:51   CT ANGIO NECK W OR WO CONTRAST  Result Date: 10/01/2020 CLINICAL DATA:  Left-sided weakness. Acute right insular infarct on CT. EXAM: CT ANGIOGRAPHY HEAD AND NECK TECHNIQUE: Multidetector CT imaging of the head and neck was performed using the standard protocol during bolus administration of intravenous contrast. Multiplanar CT image reconstructions and MIPs were  obtained to evaluate the vascular anatomy. Carotid stenosis measurements (when applicable) are obtained utilizing NASCET criteria, using the distal internal carotid diameter as the denominator. CONTRAST:  58mL OMNIPAQUE IOHEXOL 350 MG/ML SOLN COMPARISON:  10/18/2019 FINDINGS: CTA NECK FINDINGS Aortic arch: Incomplete imaging of the aortic arch with prominent calcified plaque. Common origin of the brachiocephalic and left common carotid arteries, a normal variant. Similar appearance of proximal bilateral  subclavian artery stenoses due to calcified plaque, severe on the right and moderate on the left. Right carotid system: Patent with prominent, predominantly calcified plaque scattered throughout the common carotid and proximal internal carotid arteries resulting in unchanged 50% mid common carotid artery stenosis. No significant ICA stenosis. Left carotid system: Patent following endarterectomy without recurrent stenosis. Vertebral arteries: The vertebral arteries are patent and codominant with scattered atherosclerotic plaque bilaterally. Unchanged moderate right and mild left vertebral artery origin stenoses. Skeleton: Mild disc degeneration in the cervical spine. Asymmetrically advanced left facet arthrosis at C3-4 and C4-5 with ankylosis at the latter. Other neck: No evidence of cervical lymphadenopathy. Unchanged coarsely calcified 1.6 cm left thyroid nodule. In the setting of significant comorbidities or limited life expectancy, no follow-up recommended (ref: J Am Coll Radiol. 2015 Feb;12(2): 143-50). Upper chest: Mild-to-moderate centrilobular emphysema and minimal biapical lung scarring. Review of the MIP images confirms the above findings CTA HEAD FINDINGS Anterior circulation: The internal carotid arteries are patent from skull base to carotid termini with calcified plaque resulting in up to mild cavernous segment stenosis bilaterally, unchanged. A moderate proximal right M1 stenosis is unchanged. There is a new 1 cm long proximal right M2 occlusion with distal reconstitution. A severe stenosis was present in this location on the prior CTA. The left MCA is patent without evidence of a significant proximal stenosis. There is an unchanged severe proximal left A1 stenosis with patent but diminutive A1 segment more distally. The right A1 segment is widely patent, and the left ACA is primarily supplied by the anterior communicating artery. There are severe bilateral A2 stenoses with chronically poor  opacification of the distal left A2 segment. No aneurysm is identified. Posterior circulation: The intracranial vertebral arteries are widely patent to the basilar. Patent PICA and SCA origins are identified bilaterally. The basilar artery is widely patent. There is a moderate-sized right posterior communicating artery. Both PCAs are patent with unchanged moderate right P1 and severe left P2 stenoses. No aneurysm is identified. Venous sinuses: As permitted by contrast timing, patent. Anatomic variants: None. Review of the MIP images confirms the above findings IMPRESSION: 1. New proximal right M2 occlusion with distal reconstitution. 2. Similar appearance of advanced intracranial atherosclerosis elsewhere with severe anterior and posterior circulation stenoses as above. 3. Unchanged 50% mid right common carotid artery stenosis. 4. Patent left carotid endarterectomy without recurrent stenosis. 5. Unchanged severe right and moderate left proximal subclavian artery stenoses. 6. Unchanged moderate right and mild left vertebral artery origin stenoses. 7. Aortic Atherosclerosis (ICD10-I70.0) and Emphysema (ICD10-J43.9). Electronically Signed   By: Logan Bores M.D.   On: 10/01/2020 15:51   MR BRAIN WO CONTRAST  Result Date: 10/01/2020 CLINICAL DATA:  Left-sided weakness. Acute right insular infarct on CT. Right M2 occlusion on CTA. EXAM: MRI HEAD WITHOUT CONTRAST TECHNIQUE: Multiplanar, multiecho pulse sequences of the brain and surrounding structures were obtained without intravenous contrast. COMPARISON:  Head CT and CTA 10/01/2020 and MRI 10/18/2019 FINDINGS: Brain: There are multiple small acute right MCA territory infarcts with the most confluent area of infarct involving the insula and  frontal operculum. Small scattered acute infarcts are noted elsewhere involving cortex and white matter of the right parietal greater than right frontal lobes. Extensive encephalomalacia anteriorly in both frontal lobes is  unchanged from the prior MRI, as are chronic hemorrhagic infarcts in the bilateral basal ganglia. There are also unchanged small chronic infarcts in the left superior frontal gyrus and both thalami. T2 hyperintensities elsewhere in the cerebral white matter bilaterally are unchanged and nonspecific but compatible with mild chronic small vessel ischemic disease. No mass, midline shift, or extra-axial fluid collection is identified. There is ex vacuo dilatation of the lateral and third ventricles related to bifrontal encephalomalacia and global cerebral atrophy. Vascular: Abnormal FLAIR signal and right MCA branch vessels with a proximal M2 occlusion demonstrated on CTA. Skull and upper cervical spine: Bifrontal craniotomy. Sinuses/Orbits: Unremarkable orbits. Paranasal sinuses and mastoid air cells are clear. Other: None. IMPRESSION: 1. Multiple small acute right MCA territory infarcts. 2. Chronic ischemia and bifrontal encephalomalacia as detailed above. Electronically Signed   By: Logan Bores M.D.   On: 10/01/2020 18:24   ECHOCARDIOGRAM COMPLETE  Result Date: 10/02/2020    ECHOCARDIOGRAM REPORT   Patient Name:   ALETA MANTERNACH Date of Exam: 10/02/2020 Medical Rec #:  [id number]       Height:       64.0 in Accession #:    [id number]      Weight:       141.0 lb Date of Birth:  05/12/1951       BSA:          1.686 m Patient Age:    19 years        BP:           118/106 mmHg Patient Gender: F               HR:           85 bpm. Exam Location:  Inpatient Procedure: 2D Echo, Cardiac Doppler, Color Doppler and Intracardiac            Opacification Agent Indications:    CVA  History:        Patient has no prior history of Echocardiogram examinations.                 Risk Factors:Hypertension and Dyslipidemia.  Sonographer:    Peebles Referring Phys: [id number] [place]  1. Left ventricular ejection fraction, by estimation, is 55 to 60%. The left ventricle has normal function. The left ventricle  has no regional wall motion abnormalities. Left ventricular diastolic parameters are consistent with Grade I diastolic dysfunction (impaired relaxation).  2. Right ventricular systolic function is normal. The right ventricular size is normal.  3. The mitral valve is grossly normal. No evidence of mitral valve regurgitation.  4. The aortic valve is grossly normal. Aortic valve regurgitation is not visualized. FINDINGS  Left Ventricle: Left ventricular ejection fraction, by estimation, is 55 to 60%. The left ventricle has normal function. The left ventricle has no regional wall motion abnormalities. The left ventricular internal cavity size was normal in size. There is  no left ventricular hypertrophy. Left ventricular diastolic parameters are consistent with Grade I diastolic dysfunction (impaired relaxation). Right Ventricle: The right ventricular size is normal. No increase in right ventricular wall thickness. Right ventricular systolic function is normal. Left Atrium: Left atrial size was normal in size. Right Atrium: Right atrial size was normal in size. Pericardium: There is no evidence of pericardial effusion.  Mitral Valve: The mitral valve is grossly normal. No evidence of mitral valve regurgitation. MV peak gradient, 5.1 mmHg. The mean mitral valve gradient is 2.0 mmHg. Tricuspid Valve: The tricuspid valve is not well visualized. Tricuspid valve regurgitation is not demonstrated. Aortic Valve: The aortic valve is grossly normal. Aortic valve regurgitation is not visualized. Aortic valve mean gradient measures 2.0 mmHg. Aortic valve peak gradient measures 3.6 mmHg. Pulmonic Valve: The pulmonic valve was not well visualized. Pulmonic valve regurgitation is not visualized. Aorta: The aortic root was not well visualized. IAS/Shunts: The atrial septum is grossly normal.  LEFT VENTRICLE PLAX 2D LVIDd:         4.50 cm Diastology LVIDs:         2.10 cm LV e' medial:    6.64 cm/s                        LV E/e' medial:   6.4                        LV e' lateral:   6.74 cm/s                        LV E/e' lateral: 6.4  RIGHT VENTRICLE RV S prime:     12.10 cm/s TAPSE (M-mode): 2.1 cm LEFT ATRIUM           Index       RIGHT ATRIUM           Index LA Vol (A4C): 30.3 ml 17.97 ml/m RA Area:     14.20 cm                                   RA Volume:   37.80 ml  22.42 ml/m  AORTIC VALVE AV Vmax:           94.80 cm/s AV Vmean:          66.800 cm/s AV VTI:            0.245 m AV Peak Grad:      3.6 mmHg AV Mean Grad:      2.0 mmHg LVOT Vmax:         53.50 cm/s LVOT Vmean:        37.200 cm/s LVOT VTI:          0.116 m LVOT/AV VTI ratio: 0.47 MITRAL VALVE MV Area (PHT): 1.72 cm    SHUNTS MV Peak grad:  5.1 mmHg    Systemic VTI: 0.12 m MV Mean grad:  2.0 mmHg MV Vmax:       1.13 m/s MV Vmean:      64.3 cm/s MV Decel Time: 442 msec MV E velocity: 42.80 cm/s MV A velocity: 85.30 cm/s MV E/A ratio:  0.50 Mertie Moores MD Electronically signed by Mertie Moores MD Signature Date/Time: 10/02/2020/10:07:13 AM    Final    CT HEAD CODE STROKE WO CONTRAST  Result Date: 10/01/2020 CLINICAL DATA:  Code stroke. 70 year old female with facial droop and left side weakness. EXAM: CT HEAD WITHOUT CONTRAST TECHNIQUE: Contiguous axial images were obtained from the base of the skull through the vertex without intravenous contrast. COMPARISON:  Brain MRI 10/18/2019.  Head CT 10/18/2019 and earlier. FINDINGS: Brain: Fairly advanced chronic encephalomalacia in both anterior frontal lobes. Ex vacuo enlargement of the frontal horns with mild  additional generalized ex vacuo appearing ventricular enlargement. Superimposed abnormal hypodensity at the right insula and operculum on series 2, image 16. But elsewhere gray-white matter differentiation appears stable, with chronic encephalomalacia in the right basal ganglia. No acute intracranial hemorrhage identified. No midline shift, mass effect, or evidence of intracranial mass lesion. Vascular: Calcified  atherosclerosis at the skull base. No suspicious intracranial vascular hyperdensity. Skull: Sequelae of bifrontal craniotomy, stable. No acute osseous abnormality identified. Sinuses/Orbits: Visualized paranasal sinuses and mastoids are clear. Other: No definite gaze deviation, acute scalp soft tissue finding. ASPECTS [facility] Stroke Program Early CT Score) Total score (0-10 with 10 being normal): 8 (acutely abnormal right insula and M2 segment. Chronically abnormal right basal ganglia, anterior frontal encephalomalacia). IMPRESSION: 1. Positive for acute cortically based infarct of the right insula and operculum. ASPECTS 8. No associated hemorrhage or mass effect. 2. Underlying chronic bifrontal encephalomalacia including chronic involvement of the bilateral basal ganglia. Prior bifrontal craniotomy. 3. These results were communicated to Dr. Curly Shores at 10:56 am on 10/01/2020 by text page via the [facility] messaging system. Electronically Signed   By: Genevie Ann M.D.   On: 10/01/2020 10:57        Scheduled Meds: . aspirin  150 mg Rectal Daily  . atorvastatin  80 mg Oral q1800  . clopidogrel  75 mg Oral Daily  . donepezil  5 mg Oral QHS  . enoxaparin (LOVENOX) injection  40 mg Subcutaneous Q24H  . melatonin  3 mg Oral QHS  . sodium chloride flush  3 mL Intravenous Once   Continuous Infusions: . sodium chloride 75 mL/hr at 10/03/20 0540  . levETIRAcetam Stopped (10/02/20 2126)          Aline August, MD [facility] 10/03/2020, 7:40 AM

## 2020-10-03 NOTE — Progress Notes (Signed)
Physical Therapy Treatment Patient Details Name: Kristy Cabrera MRN: [id number] DOB: 05/26/1951 Today's Date: 10/03/2020    History of Present Illness 70 y.o. female with medical history significant for advanced dementia, HTN, meningioma s/p resection, seizure, unspecified left MCA stroke s/p left-sided endarterectomy in 2020, HLD, currently enrolled in home hospice presented with new onset of left facial droop, left-sided weakness and slurred speech.  CT of the head showed acute stroke on right insula and operculum.    PT Comments    Patient received resting in bed. Agrees to PT session. RN reports she was asking to get up earlier. Patient is difficult to understand at times, speaks with low volume and tends to shake or nod head in response to questions. Patient requires mod/max assist for supine to sit. Sit to stand with min assist from elevated surface. Patient able to ambulate 5 feet with RW and min assist. Small steps, shuffle gait pattern. Chair following for safety. Patient limited by fatigue and will continue to benefit from skilled PT while here to improve functional independence and strength.       Follow Up Recommendations  SNF;Supervision/Assistance - 24 hour     Equipment Recommendations  None recommended by PT    Recommendations for Other Services       Precautions / Restrictions Precautions Precautions: Fall Precaution Comments: advanced dementia Restrictions Weight Bearing Restrictions: No    Mobility  Bed Mobility Overal bed mobility: Needs Assistance Bed Mobility: Supine to Sit     Supine to sit: Mod assist;HOB elevated     General bed mobility comments: Patient instructed to move to edge of bed. She moves LEs slightly. Required mod/max assist to get to sitting on edge of bed.    Transfers Overall transfer level: Needs assistance Equipment used: Rolling walker (2 wheeled) Transfers: Sit to/from Stand Sit to Stand: From elevated surface;Min assist          General transfer comment: Min assist from elevated surface, cues for hand placement  Ambulation/Gait Ambulation/Gait assistance: Min assist Gait Distance (Feet): 5 Feet Assistive device: Rolling walker (2 wheeled) Gait Pattern/deviations: Step-to pattern;Decreased step length - right;Decreased step length - left;Shuffle;Decreased stride length;Narrow base of support Gait velocity: decreased   General Gait Details: patient shuffled a few steps at a time with frequent cues to continue moving. Fatigued after ambulating 5 feet. Chair following.   Stairs             Wheelchair Mobility    Modified Rankin (Stroke Patients Only) Modified Rankin (Stroke Patients Only) Pre-Morbid Rankin Score: Moderate disability Modified Rankin: Moderately severe disability     Balance Overall balance assessment: Needs assistance Sitting-balance support: Feet supported Sitting balance-Leahy Scale: Good Sitting balance - Comments: Able to sit edge of bed with supervision once positioned there   Standing balance support: Bilateral upper extremity supported;During functional activity Standing balance-Leahy Scale: Fair Standing balance comment: reliant on external support                            Cognition Arousal/Alertness: Awake/alert Behavior During Therapy: WFL for tasks assessed/performed Overall Cognitive Status: No family/caregiver present to determine baseline cognitive functioning                                 General Comments: HOH, having to repeat mobility instructions multiple times throughout session. Speaks with low volume  Exercises      General Comments        Pertinent Vitals/Pain Pain Assessment: Faces Pain Score: 0-No pain    Home Living                      Prior Function            PT Goals (current goals can now be found in the care plan section) Acute Rehab PT Goals Patient Stated Goal: unable to state PT  Goal Formulation: Patient unable to participate in goal setting Time For Goal Achievement: 10/16/20 Progress towards PT goals: Progressing toward goals    Frequency    Min 3X/week      PT Plan Current plan remains appropriate    Co-evaluation              AM-PAC PT "6 Clicks" Mobility   Outcome Measure  Help needed turning from your back to your side while in a flat bed without using bedrails?: A Lot Help needed moving from lying on your back to sitting on the side of a flat bed without using bedrails?: A Lot Help needed moving to and from a bed to a chair (including a wheelchair)?: A Little Help needed standing up from a chair using your arms (e.g., wheelchair or bedside chair)?: A Little Help needed to walk in hospital room?: A Little Help needed climbing 3-5 steps with a railing? : Total 6 Click Score: 14    End of Session Equipment Utilized During Treatment: Gait belt Activity Tolerance: Patient limited by fatigue Patient left: in chair;with call bell/phone within reach;with chair alarm set Nurse Communication: Mobility status PT Visit Diagnosis: Muscle weakness (generalized) (M62.81);Difficulty in walking, not elsewhere classified (R26.2);Other abnormalities of gait and mobility (R26.89)     Time: 3009-2330 PT Time Calculation (min) (ACUTE ONLY): 23 min  Charges:  $Gait Training: 23-37 mins                     Nathanal Hermiz, PT, GCS 10/03/20,10:35 AM

## 2020-10-03 NOTE — NC FL2 (Signed)
[place] LEVEL OF CARE SCREENING TOOL     IDENTIFICATION  Patient Name: Kristy Cabrera Birthdate: Sep 10, 1950 Sex: female Admission Date (Current Location): 10/01/2020  [facility] [place] Number:  Herbalist and Address:  [facility], [place] [address] [place], [place] [address]      Provider Number: [id number]  Attending Physician Name and Address:  Aline August, MD  Relative Name and Phone Number:  Darlen Round [phone]    Current Level of Care: Hospital Recommended Level of Care: [place] Prior Approval Number:    Date Approved/Denied:   PASRR Number: [id number]  Discharge Plan: SNF    Current Diagnoses: Patient Active Problem List   Diagnosis Date Noted  . Stroke (cerebrum) ([place]) 10/01/2020  . Subclinical hyperthyroidism 10/31/2018  . History of stroke 10/31/2018  . Leukocytosis 10/31/2018  . Syncope and collapse 10/30/2018  . Stenosis of internal carotid artery with cerebral infarction, left ([place]) 10/24/2018  . Seizure disorder ([place]) 10/24/2018  . COPD (chronic obstructive pulmonary disease) ([place]) 10/24/2018  . Syncope 10/24/2018  . Acute ischemic stroke ([place]) 10/24/2018  . Cognitive impairment 10/24/2018  . Confusion   . Acute hypokalemia   . Hydrochlorothiazide adverse reaction   . Hyponatremia 07/04/2016  . Acute hyponatremia   . Alcohol abuse   . Alcohol withdrawal syndrome with complication ([place])   . Acute encephalopathy   . Essential hypertension 07/14/2014  . Hearing loss of left ear 07/14/2014  . Atypical meningioma of brain ([place]) 06/29/2014    Orientation RESPIRATION BLADDER Height & Weight     Self,Time  Normal Continent Weight: 64 kg Height:  5\' 4"  (162.6 cm)  BEHAVIORAL SYMPTOMS/MOOD NEUROLOGICAL BOWEL NUTRITION STATUS    Convulsions/Seizures (Hx seizures) Continent Diet  AMBULATORY STATUS COMMUNICATION OF NEEDS Skin   Limited Assist Verbally Normal                        Personal Care Assistance Level of Assistance  Bathing,Feeding,Dressing,Total care Bathing Assistance: Maximum assistance Feeding assistance: Limited assistance (set up) Dressing Assistance: Maximum assistance Total Care Assistance: Maximum assistance   Functional Limitations Info  Sight,Hearing,Speech Sight Info: Adequate Hearing Info: Adequate Speech Info: Impaired (slurred/ dysarthria)    SPECIAL CARE FACTORS FREQUENCY  PT (By licensed PT),OT (By licensed OT)     PT Frequency: 5X OT Frequency: 5X            Contractures Contractures Info: Not present    Additional Factors Info  Code Status,Allergies,Psychotropic,Insulin Sliding Scale,Isolation Precautions,Suctioning Needs Code Status Info: DNR Allergies Info: No known drug allergies Psychotropic Info: Advanced dementia Insulin Sliding Scale Info: See discharge summary for sliding scale info Isolation Precautions Info: None Suctioning Needs: n/a   Current Medications (10/03/2020):  This is the current hospital active medication list Current Facility-Administered Medications  Medication Dose Route Frequency Provider Last Rate Last Admin  . acetaminophen (TYLENOL) tablet 650 mg  650 mg Oral Q4H PRN Wynetta Fines T, MD       Or  . acetaminophen (TYLENOL) 160 MG/5ML solution 650 mg  650 mg Per Tube Q4H PRN Wynetta Fines T, MD       Or  . acetaminophen (TYLENOL) suppository 650 mg  650 mg Rectal Q4H PRN Lequita Halt, MD      . Derrill Memo ON 10/04/2020] aspirin EC tablet 81 mg  81 mg Oral Daily Alekh, Kshitiz, MD      . atorvastatin (LIPITOR) tablet 80 mg  80 mg Oral q1800 Vernelle Emerald, MD   80 mg at 10/03/20 1603  . clopidogrel (PLAVIX) tablet 75 mg  75 mg Oral Daily Shalhoub, Sherryll Burger, MD   75 mg at 10/03/20 0937  . donepezil (ARICEPT) tablet 5 mg  5 mg Oral QHS Shalhoub, Sherryll Burger, MD   5 mg at 10/02/20 2109  . enoxaparin (LOVENOX) injection 40 mg  40 mg Subcutaneous Q24H Wynetta Fines T, MD   40 mg at 10/03/20 1602   . feeding supplement (ENSURE ENLIVE / ENSURE PLUS) liquid 237 mL  237 mL Oral TID BM Starla Link, Kshitiz, MD   237 mL at 10/03/20 1603  . hydrALAZINE (APRESOLINE) injection 2 mg  2 mg Intravenous Q6H PRN Bhagat, Srishti L, MD      . levETIRAcetam (KEPPRA) tablet 750 mg  750 mg Oral BID Pham, Minh Q, RPH-CPP      . LORazepam (ATIVAN) injection 1 mg  1 mg Intravenous Once PRN Bhagat, Srishti L, MD      . melatonin tablet 3 mg  3 mg Oral QHS Wynetta Fines T, MD   3 mg at 10/02/20 2109  . multivitamin with minerals tablet 1 tablet  1 tablet Oral Daily Aline August, MD   1 tablet at 10/03/20 1603  . senna-docusate (Senokot-S) tablet 1 tablet  1 tablet Oral QHS PRN Wynetta Fines T, MD      . sodium chloride flush (NS) 0.9 % injection 3 mL  3 mL Intravenous Once Pattricia Boss, MD         Discharge Medications: Please see discharge summary for a list of discharge medications.  Relevant Imaging Results:  Relevant Lab Results:   Additional Information SS# [id number]  Angelita Ingles, RN

## 2020-10-03 NOTE — Progress Notes (Signed)
  Speech Language Pathology Treatment: Dysphagia  Patient Details Name: Kristy Cabrera MRN: [id number] DOB: 1951/06/16 Today's Date: 10/03/2020 Time: 4098-1191 SLP Time Calculation (min) (ACUTE ONLY): 18 min  Assessment / Plan / Recommendation Clinical Impression  Pt was seen for dysphagia therapy.  Her husband was at the bedside.  Ms. Kristy Cabrera demonstrated improved initiation of mastication and fewer delays.  She consumed a package of graham crackers with adequate attention/mastication, and followed with sips of thin liquid with no coughing, no concerns for aspiration.  Voice was clear throughout.  She was more attentive to spills/crumbs in bed and speech appeared to be subjectively clearer today.  Recommend advancing diet to regular solids; she will need assistance with tray set-up.  Continue to give meds whole in puree. SLP will follow briefly to ensure safety with advanced diet; continue f/u for dysarthria/communication.    HPI HPI: 70 y.o. female with medical history significant for advanced dementia, HTN, meningioma s/p resection, seizure, unspecified left MCA stroke s/p left-sided endarterectomy in 2020, HLD, currently enrolled in home hospice presented with new onset of left facial droop, left-sided weakness and slurred speech.  CT of the head showed acute stroke on right insula and operculum. PTA pt walked 25' with rollator, I with bathing/dressing, fed herself regular diet.      SLP Plan  Continue with current plan of care       Recommendations  Diet recommendations: Regular;Thin liquid Liquids provided via: Cup;Straw Medication Administration: Whole meds with puree Supervision: Staff to assist with self feeding Postural Changes and/or Swallow Maneuvers: Seated upright 90 degrees                Oral Care Recommendations: Oral care BID Follow up Recommendations: Skilled Nursing facility SLP Visit Diagnosis: Dysphagia, unspecified (R13.10) Plan: Continue with current plan of  care       [place]. Kristy Cabrera, Sel[place] CCC/SLP Acute Rehabilitation Services Office number [phone] Pager [phone]  Kristy Cabrera 10/03/2020, 3:12 PM

## 2020-10-03 NOTE — Progress Notes (Signed)
Initial Nutrition Assessment  DOCUMENTATION CODES:  Non-severe (moderate) malnutrition in context of chronic illness  INTERVENTION:  Add Ensure Enlive po TID, each supplement provides 350 kcal and 20 grams of protein.  Add Magic cup TID with meals, each supplement provides 290 kcal and 9 grams of protein.  Add MVI with minerals daily.  NUTRITION DIAGNOSIS:  Moderate Malnutrition related to chronic illness (advanced dementia; s/p R MCA in 2020, declined since) as evidenced by moderate muscle depletion,mild muscle depletion,mild fat depletion.  GOAL:  Patient will meet greater than or equal to 90% of their needs  MONITOR:  PO intake,Supplement acceptance  REASON FOR ASSESSMENT:  Malnutrition Screening Tool    ASSESSMENT:  70 yo female with a PMH of advanced dementia, HTN, meningioma s/p resection, seizure, left MCA stroke s/p L-sided endarterectomy in 2020, and HLD who presents with acute R-sided stroke. Of note, pt is DNR and enrolled with Hospice at [facility]. Per Palliative Care, pt requests no artificial feeding. 3/14 - SLP rec'd Dys 1 with thins  Due to advanced dementia, pt considered a poor historian. Pt noted that she has not noticed a change in appetite or any weight loss. Reports eating 3 times per day.  Breakfast: D[place] Clap breakfast sandwich Lunch: salami (with nothing else) Dinner: husband cooks a lot, would not say what, but said it's "a lot" of food.  Per Epic, pt had a 18 lb (11%) weight loss over 2 years.  Recommend adding Ensure and Magic Cup TID and MVI.  Relevant Medications: reviewed Labs: reviewed  NUTRITION - FOCUSED PHYSICAL EXAM: Flowsheet Row Most Recent Value  Orbital Region Mild depletion  Upper Arm Region Mild depletion  Thoracic and Lumbar Region Mild depletion  Buccal Region Mild depletion  Temple Region Moderate depletion  Clavicle Bone Region Mild depletion  Clavicle and Acromion Bone Region Moderate depletion  Scapular Bone Region  Mild depletion  Dorsal Hand Moderate depletion  Patellar Region Mild depletion  Anterior Thigh Region Mild depletion  Posterior Calf Region Moderate depletion  Edema (RD Assessment) None  Hair Reviewed  Eyes Reviewed  Mouth Other (Comment)  [multiple teeth missing]  Skin Other (Comment)  [skin is very dry]  Nails Reviewed     Diet Order:   Diet Order            DIET - DYS 1 Room service appropriate? No; Fluid consistency: Thin  Diet effective now                EDUCATION NEEDS:  No education needs have been identified at this time  Skin:  Skin Assessment: Reviewed RN Assessment  Last BM:  PTA  Height:  Ht Readings from Last 1 Encounters:  10/01/20 5\' 4"  (1.626 m)   Weight:  Wt Readings from Last 1 Encounters:  10/01/20 64 kg   Ideal Body Weight:  54.5 kg  BMI:  Body mass index is 24.2 kg/m.  Estimated Nutritional Needs:  Kcal:  1600-1800 Protein:  80-95 grams Fluid:  >1.6 L  Derrel Nip, RD, LDN Registered Dietitian After Hours/Weekend Pager # in [place]

## 2020-10-04 DIAGNOSIS — I639 Cerebral infarction, unspecified: Secondary | ICD-10-CM | POA: Diagnosis not present

## 2020-10-04 DIAGNOSIS — Z66 Do not resuscitate: Secondary | ICD-10-CM

## 2020-10-04 DIAGNOSIS — E440 Moderate protein-calorie malnutrition: Secondary | ICD-10-CM | POA: Diagnosis not present

## 2020-10-04 DIAGNOSIS — Z515 Encounter for palliative care: Secondary | ICD-10-CM | POA: Diagnosis not present

## 2020-10-04 DIAGNOSIS — F0390 Unspecified dementia without behavioral disturbance: Secondary | ICD-10-CM | POA: Diagnosis not present

## 2020-10-04 DIAGNOSIS — R1310 Dysphagia, unspecified: Secondary | ICD-10-CM | POA: Diagnosis not present

## 2020-10-04 NOTE — Progress Notes (Signed)
Daily Progress Note   Patient Name: Kristy Cabrera       Date: 10/04/2020 DOB: May 08, 1951  Age: 70 y.o. MRN#: [id number] Attending Physician: Damita Lack, MD Primary Care Physician: [name], NP Admit Date: 10/01/2020  Reason for Consultation/Follow-up: goals of care  Subjective:  Advanced directives, concepts specific to code status, artifical feeding and hydration, and rehospitalization were explained and discussed.  An electronic MOST form was completed today with patient's husband. He outlined their wishes for the following treatment decisions:  Cardiopulmonary Resuscitation: Do Not Attempt Resuscitation (DNR/No CPR)  Medical Interventions: Comfort Measures: Keep clean, warm, and dry. Use medication by any route, positioning, wound care, and other measures to relieve pain and suffering. Use oxygen, suction and manual treatment of airway obstruction as needed for comfort. Do not transfer to the hospital unless comfort needs cannot be met in current location.  Antibiotics: Antibiotics if indicated  IV Fluids: No IV fluids (provide other measures to ensure comfort)  Feeding Tube: No feeding tube     Length of Stay: 3  Current Medications: Scheduled Meds:  . aspirin EC  81 mg Oral Daily  . atorvastatin  80 mg Oral q1800  . clopidogrel  75 mg Oral Daily  . donepezil  5 mg Oral QHS  . enoxaparin (LOVENOX) injection  40 mg Subcutaneous Q24H  . feeding supplement  237 mL Oral TID BM  . levETIRAcetam  750 mg Oral BID  . melatonin  3 mg Oral QHS  . multivitamin with minerals  1 tablet Oral Daily  . sodium chloride flush  3 mL Intravenous Once      PRN Meds: acetaminophen **OR** acetaminophen (TYLENOL) oral liquid 160 mg/5 mL **OR** acetaminophen, hydrALAZINE, LORazepam,  senna-docusate       Vital Signs: BP (!) 174/75   Pulse 60   Temp 98.5 F (36.9 C) (Oral)   Resp 15   Ht $R'5\' 4"'dI$  (1.626 m)   Wt 64 kg   SpO2 97%   BMI 24.20 kg/m  SpO2: SpO2: 97 % O2 Device: O2 Device: Room Air O2 Flow Rate:    Intake/output summary:   Intake/Output Summary (Last 24 hours) at 10/04/2020 1836 Last data filed at 10/04/2020 0356 Gross per 24 hour  Intake --  Output 700 ml  Net -700 ml   LBM:  Last BM Date:  (pta) Baseline Weight: Weight: 64 kg Most recent weight: Weight: 64 kg        Palliative Assessment/Data: PPS 40%       Palliative Care Assessment & Plan    HPI/Patient Profile: 70 y.o. female  with past medical history of  HTN, HLD, carotid stenosis (s/p left CEA 10/28/2018 with residual aphasia, seizure disorder, meningioma s/p resection, and dementia presented to the ED on 10/01/20 from home with husband concerns of left sided weakness, facial droop, and slurred speech. Husband reported that patient was started home hospice 4 to 5 months ago due to progressing dementia. Patient was admitted on 10/01/2020 with acute right side stroke, seizure disorder, advanced dementia.    ED Course: Code stroke was called, initially was considered to have breakthrough seizure and Keppra loading was done.  However CT head acute stroke on right insula and operculum.   Assessment: - acute right sided stroke - advanced dementia - seizure disorder - dysphagia with mild aspiration risk   Recommendations/Plan:  DNR/DNI as previously documented  Continue current medical care  Electronic MOST form completed   Disposition plan is for SNF/rehab  After discharge from rehab, patient will return [facility] under [facility]  No additional PMT needs at this time, please call [phone] if we can be of additional assistance or need to actively re-engage with this patient and family   Goals of Care and Additional Recommendations:  Limitations on  Scope of Treatment: see MOST form  Code Status: DNR/DNI  Prognosis:   < 6 months  Discharge Planning:  SNF for rehab   Thank you for allowing the Palliative Medicine Team to assist in the care of this patient.   Total Time 25 minutes Prolonged Time Billed  no       Greater than 50%  of this time was spent counseling and coordinating care related to the above assessment and plan.  Lavena Bullion, NP  Please contact Palliative Medicine Team phone at [phone] for questions and concerns.

## 2020-10-04 NOTE — TOC Progression Note (Signed)
Transition of Care [facility]) - Progression Note    Patient Details  Name: Kristy Cabrera MRN: [id number] Date of Birth: 02-May-1951  [facility] CM/SW [place], RN Phone Number: [phone]  10/04/2020, 8:55 AM  Clinical Narrative:    [facility] consulted for SNF placement. SNF workup complete and patients info has been faxed out with Husbands permission. Insurance auth initiated with [facility] on 3/15. Husbands 1st choice for bed is [facility]- offer is pending. CM has left message with admissions to determine bed availability. TOC will continue to follow        Expected Discharge Plan and Services                                                 Social Determinants of Health (SDOH) Interventions    Readmission Risk Interventions Readmission Risk Prevention Plan 10/29/2018  Post Dischage Appt Complete  Medication Screening Complete  Transportation Screening Complete  Some recent data might be hidden

## 2020-10-04 NOTE — Care Management Important Message (Signed)
Important Message  Patient Details  Name: Kristy Cabrera MRN: [id number] Date of Birth: 1950/08/13   Medicare Important Message Given:  Yes     Lydon Vansickle Montine Circle 10/04/2020, 3:11 PM

## 2020-10-04 NOTE — Progress Notes (Signed)
PROGRESS NOTE    Kristy Cabrera  JOI:[id number] DOB: [birth year] DOA: 10/01/2020 PCP: [name], NP   Brief Narrative:  70 y.o.femalewith medical history significant ofadvanced dementia, HTN, meningioma s/p resection, seizure, unspecified left MCA stroke s/pleft-sided endarterectomy in 2020, HLD, currently enrolled in home hospice presented with new onset of left facial droop, left-sided weakness and slurred speech.  On presentation code stroke was called.  CT of the head showed acute stroke on right insula and operculum.  Neurology was consulted.   Assessment & Plan:   Active Problems:   Acute ischemic stroke [facility])   Stroke (cerebrum) (HCC)   Acute right-sided stroke, right MCA territory -Continues to have slurred speech and left-sided facial droop. CT of the head was showing acute stroke in right insula and operculum.  MRI of brain showed multiple small acute right MCA territory infarcts.  CTA head showed new proximal right M2 occlusion with distal reconstitution with severe intracranial atherosclerosis and unchanged 50% mid right common carotid artery stenosis and patent left carotid endarterectomy without recurrent stenosis -Per neurology continue aspirin statin and Plavix -PT/OT recommend SNF placement.  Social worker consult -Diet as per SLP recommendations  -Echo shows EF of 55 to 60% with grade 1 diastolic dysfunction. -Antihypertensives being on hold for now.  Allow for permissive hypertension -A1c 5.7 -LDL 153 and cholesterol 220  Advanced dementia -Continue Aricept. -Fall precautions.  Delirium precautions.  Seizure disorder -Continue p.o. Keppra  Hyperlipidemia -Continue statin  Mild leukocytosis -Resolved   PT/OT evaluation performed.  SNF recommended.  Prior to admission according to husband patient was ambulatory within the house with the use of her walker but since her current stroke has become extremely weak.  He thinks patient may benefit from  skilled nursing rehab as bedtime she may make some recovery to prior ambulatory/functional status   DVT prophylaxis: Lovenox Code Status: DNR Family Communication: Spoke with patient's husband  Status is: Inpatient  Remains inpatient appropriate because:Unsafe d/c plan   Dispo: The patient is from: Home              Anticipated d/c is to: SNF              Patient currently is medically stable to d/c.   Difficult to place patient No       Body mass index is 24.2 kg/m.        Subjective: Patient laying in the bed, still has left-sided weakness, facial droop and slurred speech.  Review of Systems Otherwise negative except as per HPI, including: General: Denies fever, chills, night sweats or unintended weight loss. Resp: Denies cough, wheezing, shortness of breath. Cardiac: Denies chest pain, palpitations, orthopnea, paroxysmal nocturnal dyspnea. GI: Denies abdominal pain, nausea, vomiting, diarrhea or constipation GU: Denies dysuria, frequency, hesitancy or incontinence MS: Denies muscle aches, joint pain or swelling Neuro: Denies headache, neurologic deficits (focal weakness, numbness, tingling), abnormal gait Psych: Denies anxiety, depression, SI/HI/AVH Skin: Denies new rashes or lesions ID: Denies sick contacts, exotic exposures, travel  Examination:  General exam: Appears calm and comfortable  Respiratory system: Clear to auscultation. Respiratory effort normal. Cardiovascular system: S1 & S2 heard, RRR. No JVD, murmurs, rubs, gallops or clicks. No pedal edema. Gastrointestinal system: Abdomen is nondistended, soft and nontender. No organomegaly or masses felt. Normal bowel sounds heard. Central nervous system: Left-sided facial droop, slurred speech.  Alert to name only. Extremities: Symmetric 5 x 5 power. Skin: No rashes, lesions or ulcers Psychiatry: Poor judgment and insight  Objective: Vitals:   10/03/20 2341 10/04/20 0353 10/04/20 0830 10/04/20 0832   BP: (!) 163/93 (!) 149/88 (!) 176/71 (!) 174/75  Pulse: 73 60    Resp:  17 16 15   Temp: 98.4 F (36.9 C) 98.5 F (36.9 C) 98.5 F (36.9 C)   TempSrc:  Oral Oral   SpO2: 96% 99% 97%   Weight:      Height:        Intake/Output Summary (Last 24 hours) at 10/04/2020 1441 Last data filed at 10/04/2020 0356 Gross per 24 hour  Intake -  Output 700 ml  Net -700 ml   Filed Weights   10/01/20 1338  Weight: 64 kg     Data Reviewed:   CBC: Recent Labs  Lab 10/01/20 1044 10/03/20 0224  WBC 11.9* 9.3  NEUTROABS 8.0* 5.8  HGB 15.3* 13.2  HCT 46.4* 39.5  MCV 91.9 88.4  PLT 259 768   Basic Metabolic Panel: Recent Labs  Lab 10/01/20 1044 10/03/20 0224  NA 137 138  K 3.5 3.6  CL 104 109  CO2 21* 20*  GLUCOSE 124* 72  BUN 17 14  CREATININE 0.86 0.56  CALCIUM 9.7 9.2  MG  --  1.8   GFR: Estimated Creatinine Clearance: 56.5 mL/min (by C-G formula based on SCr of 0.56 mg/dL). Liver Function Tests: Recent Labs  Lab 10/01/20 1044 10/03/20 0224  AST 23 19  ALT 15 13  ALKPHOS 102 84  BILITOT 0.8 0.9  PROT 8.0 6.9  ALBUMIN 4.1 3.3*   No results for input(s): LIPASE, AMYLASE in the last 168 hours. No results for input(s): AMMONIA in the last 168 hours. Coagulation Profile: Recent Labs  Lab 10/01/20 1044  INR 0.9   Cardiac Enzymes: No results for input(s): CKTOTAL, CKMB, CKMBINDEX, TROPONINI in the last 168 hours. BNP (last 3 results) No results for input(s): PROBNP in the last 8760 hours. HbA1C: Recent Labs    10/02/20 0342  HGBA1C 5.7*   CBG: Recent Labs  Lab 10/01/20 1039  GLUCAP 117*   Lipid Profile: Recent Labs    10/02/20 0342  CHOL 220*  HDL 53  LDLCALC 153*  TRIG 72  CHOLHDL 4.2   Thyroid Function Tests: No results for input(s): TSH, T4TOTAL, FREET4, T3FREE, THYROIDAB in the last 72 hours. Anemia Panel: No results for input(s): VITAMINB12, FOLATE, FERRITIN, TIBC, IRON, RETICCTPCT in the last 72 hours. Sepsis Labs: No results for  input(s): PROCALCITON, LATICACIDVEN in the last 168 hours.  Recent Results (from the past 240 hour(s))  SARS CORONAVIRUS 2 (TAT 6-24 HRS) Nasopharyngeal Nasopharyngeal Swab     Status: None   Collection Time: 10/01/20  2:21 PM   Specimen: Nasopharyngeal Swab  Result Value Ref Range Status   SARS Coronavirus 2 NEGATIVE NEGATIVE Final    Comment: (NOTE) SARS-CoV-2 target nucleic acids are NOT DETECTED.  The SARS-CoV-2 RNA is generally detectable in upper and lower respiratory specimens during the acute phase of infection. Negative results do not preclude SARS-CoV-2 infection, do not rule out co-infections with other pathogens, and should not be used as the sole basis for treatment or other patient management decisions. Negative results must be combined with clinical observations, patient history, and epidemiological information. The expected result is Negative.  Fact Sheet for Patients: SugarRoll.be  Fact Sheet for Healthcare Providers: https://www.woods-mathews.com/  This test is not yet approved or cleared by the [place] FDA and  has been authorized for detection and/or diagnosis of SARS-CoV-2 by FDA under an Emergency  Use Authorization (EUA). This EUA will remain  in effect (meaning this test can be used) for the duration of the COVID-19 declaration under Se ction 564(b)(1) of the Act, 21 U.S.C. section 360bbb-3(b)(1), unless the authorization is terminated or revoked sooner.  Performed at [facility], [place] [address], [place], [place] [address]          Radiology Studies: No results found.      Scheduled Meds: . aspirin EC  81 mg Oral Daily  . atorvastatin  80 mg Oral q1800  . clopidogrel  75 mg Oral Daily  . donepezil  5 mg Oral QHS  . enoxaparin (LOVENOX) injection  40 mg Subcutaneous Q24H  . feeding supplement  237 mL Oral TID BM  . levETIRAcetam  750 mg Oral BID  . melatonin  3 mg Oral QHS  .  multivitamin with minerals  1 tablet Oral Daily  . sodium chloride flush  3 mL Intravenous Once   Continuous Infusions:   LOS: 3 days   Time spent= 35 mins    Ankit Arsenio Loader, MD [facility]  If 7PM-7AM, please contact night-coverage  10/04/2020, 2:41 PM

## 2020-10-05 DIAGNOSIS — E440 Moderate protein-calorie malnutrition: Secondary | ICD-10-CM | POA: Insufficient documentation

## 2020-10-05 LAB — LEVETIRACETAM LEVEL: Levetiracetam Lvl: 20.3 ug/mL (ref 10.0–40.0)

## 2020-10-05 NOTE — Progress Notes (Signed)
Occupational Therapy Treatment Patient Details Name: Kristy Cabrera MRN: [id number] DOB: Apr 16, 1951 Today's Date: 10/05/2020    History of present illness 70 y.o. female with medical history significant for advanced dementia, HTN, meningioma s/p resection, seizure, unspecified left MCA stroke s/p left-sided endarterectomy in 2020, HLD, currently enrolled in home hospice presented with new onset of left facial droop, left-sided weakness and slurred speech.  CT of the head showed acute stroke on right insula and operculum.   OT comments  Pt making steady progress towards OT goals this session. Pt continues to present with decreased activity tolerance, baseline cognitive deficits, impaired balance and impaired coordination. Pt able to complete functional mobility with Rw from EOB<> sink with MIN A and step by step tactile cues to sequence gait as pt presents with slow shuffling gait. Pt able to stand at sink for UB grooming tasks with min guard for standing tasks and supervision for sitting ADLs with pt needing intermittent cues to sequence steps such as turning water off or replacing toothpaste cap. Pt required x2 seated rest breaks during functional mobility with HR increasing to as much as 135 bpm but returning to [facility] with rest break provided. Pt would continue to benefit from skilled occupational therapy while admitted and after d/c to address the below listed limitations in order to improve overall functional mobility and facilitate independence with BADL participation. DC plan remains appropriate, will follow acutely per POC.     Follow Up Recommendations  SNF;Home health OT;Supervision/Assistance - 24 hour;Other (comment) (SNF vs HH based on assist level at home)    Equipment Recommendations  3 in 1 bedside commode    Recommendations for Other Services      Precautions / Restrictions Precautions Precautions: Fall Precaution Comments: advanced dementia Restrictions Weight Bearing  Restrictions: No       Mobility Bed Mobility Overal bed mobility: Needs Assistance Bed Mobility: Supine to Sit;Sit to Supine     Supine to sit: Mod assist;HOB elevated Sit to supine: Mod assist;HOB elevated   General bed mobility comments: MOD A needed to scoot BLEs fully to EOB and to elevate trunk into sitting    Transfers Overall transfer level: Needs assistance Equipment used: Rolling walker (2 wheeled) Transfers: Sit to/from Stand Sit to Stand: Min assist;Min guard         General transfer comment: pt completed multiple sit<>stands during session with pt initally needing MINA from EOB but progressing to min guard as session progressed    Balance Overall balance assessment: Needs assistance Sitting-balance support: Feet supported Sitting balance-Leahy Scale: Good Sitting balance - Comments: Able to sit edge of bed with supervision, upon arrival pt with strong R lateral lean from upright position in bed   Standing balance support: Single extremity supported;During functional activity Standing balance-Leahy Scale: Poor Standing balance comment: at least one UE supported during standing ADLs                           ADL either performed or assessed with clinical judgement   ADL Overall ADL's : Needs assistance/impaired     Grooming: Wash/dry face;Oral care;Brushing hair;Sitting;Standing;Supervision/safety;Min guard;Cueing for sequencing;Cueing for safety Grooming Details (indicate cue type and reason): combination of standing and sitting tasks, min guard for standing, sueprvision for sitting, intermittent cues needed for sequencingn and safety awareness such as turning water off                 Toilet Transfer: Minimal assistance;Ambulation;RW Armed forces technical officer  Details (indicate cue type and reason): simulated via functional mobility from EOB<>sink, tactile cues needed to sequence gait         Functional mobility during ADLs: Minimal  assistance;Rolling walker;Cueing for safety;Cueing for sequencing General ADL Comments: pt continues to present with impaird coordination, baseline cognitive deficits, and decreased ability to care for self. pt able to complete household distance functional mobility with Rw and sitting/ standing ADLS at sink     Vision       Perception     Praxis      Cognition Arousal/Alertness: Awake/alert Behavior During Therapy: WFL for tasks assessed/performed Overall Cognitive Status: History of cognitive impairments - at baseline                                 General Comments: pt following one step commands with increased time needing tacile cues at times for mobility tasks. pt able to state name, DOB and that she goes by "[facility]"        Exercises     Shoulder Instructions       General Comments HR increasing to 135 bpm with mobility, HR 87 bpm at end of session from supine    Pertinent Vitals/ Pain       Pain Assessment: Faces Faces Pain Scale: Hurts a little bit Pain Location: R hand Pain Descriptors / Indicators: Aching Pain Intervention(s): Monitored during session  Home Living                                          Prior Functioning/Environment              Frequency  Min 2X/week        Progress Toward Goals  OT Goals(current goals can now be found in the care plan section)  Progress towards OT goals: Progressing toward goals  Acute Rehab OT Goals Patient Stated Goal: unable to state OT Goal Formulation: Patient unable to participate in goal setting Time For Goal Achievement: 10/16/20 Potential to Achieve Goals: Good  Plan Discharge plan remains appropriate;Frequency remains appropriate    Co-evaluation                 AM-PAC OT "6 Clicks" Daily Activity     Outcome Measure   Help from another person eating meals?: None Help from another person taking care of personal grooming?: A Little Help from another  person toileting, which includes using toliet, bedpan, or urinal?: A Little Help from another person bathing (including washing, rinsing, drying)?: A Lot Help from another person to put on and taking off regular upper body clothing?: A Little Help from another person to put on and taking off regular lower body clothing?: A Lot 6 Click Score: 17    End of Session Equipment Utilized During Treatment: Gait belt;Rolling walker  OT Visit Diagnosis: Unsteadiness on feet (R26.81);Muscle weakness (generalized) (M62.81)   Activity Tolerance Patient tolerated treatment well   Patient Left in bed;with call bell/phone within reach;with bed alarm set   Nurse Communication Mobility status        Time: 9323-5573 OT Time Calculation (min): 31 min  Charges: OT General Charges $OT Visit: 1 Visit OT Treatments $Self Care/Home Management : 23-37 mins  [name]., COTA/L Acute Rehabilitation Services [phone]    Kristy Cabrera 10/05/2020, 11:27  AM    

## 2020-10-05 NOTE — TOC Progression Note (Addendum)
Transition of Care [facility]) - Progression Note    Patient Details  Name: Kristy Cabrera MRN: [id number] Date of Birth: 07/29/1950  Transition of Care [facility]) CM/SW [place], RN Phone Number: [phone]  10/05/2020, 8:49 AM  Clinical Narrative:    [employer] has declined request for SNF. Request for [facility] has been approved. Approval # [id number] good for 90 days from time of request. Message has been sent to MD in reference to peer to peer. CM will discuss alternate plan with husband.        Expected Discharge Plan and Services                                                 Social Determinants of Health (SDOH) Interventions    Readmission Risk Interventions Readmission Risk Prevention Plan 10/29/2018  Post Dischage Appt Complete  Medication Screening Complete  Transportation Screening Complete  Some recent data might be hidden

## 2020-10-05 NOTE — TOC Progression Note (Addendum)
Transition of Care [facility]) - Progression Note    Patient Details  Name: Kristy Cabrera MRN: [id number] Date of Birth: [birth year]  Transition of Care [facility]) CM/SW Contact  Angelita Ingles, RN Phone Number: [phone]  10/05/2020, 1:32 PM  Clinical Narrative:    CM spoke with husband to make him aware that patient has been declined for rehab. Husband states that he is not able to care for the patient due to being confined to a wheelchair. Husband states that something will have to be arranged through Hospice. CM to follow up with Hospice.  1340 CM spoke with [facility]. Sharyn Lull confirms that patient is active with Hospice. SW Ivin Booty to call CM back to discuss discharge options for the patient.   1350 CM received call from Clydia Llano at [facility]. Per Ivin Booty the patient does not meet the criteria for Hospice House ( Has to be actively dying) and will need long term care placement. Hospice has no other options to assist patient with placement. Husband has been updated.   [place] with Hospice called back with a recommendation of applying for medicaid as an option to pay for long term care. Message has been sent to financial counselor.    1600 Financial counselor [facility])  has spoken with husband. Medicaid application process to begin. Paperwork to be mailed to husband for signature.       Expected Discharge Plan and Services                                                 Social Determinants of Health (SDOH) Interventions    Readmission Risk Interventions Readmission Risk Prevention Plan 10/29/2018  Post Dischage Appt Complete  Medication Screening Complete  Transportation Screening Complete  Some recent data might be hidden

## 2020-10-05 NOTE — Progress Notes (Signed)
PROGRESS NOTE    Kristy Cabrera  RCV:[id number] DOB: [birth year] DOA: 10/01/2020 PCP: [name], NP   Brief Narrative:  70 y.o.femalewith medical history significant ofadvanced dementia, HTN, meningioma s/p resection, seizure, unspecified left MCA stroke s/pleft-sided endarterectomy in 2020, HLD, currently enrolled in home hospice presented with new onset of left facial droop, left-sided weakness and slurred speech.  On presentation code stroke was called.  CT of the head showed acute stroke on right insula and operculum.  Neurology was consulted.   Assessment & Plan:   Active Problems:   Acute ischemic stroke (HCC)   Stroke (cerebrum) (HCC)   Malnutrition of moderate degree   Acute right-sided stroke, right MCA territory -Continues to have slurred speech and left-sided facial droop. CT of the head was showing acute stroke in right insula and operculum.  MRI of brain showed multiple small acute right MCA territory infarcts.  CTA head showed new proximal right M2 occlusion with distal reconstitution with severe intracranial atherosclerosis and unchanged 50% mid right common carotid artery stenosis and patent left carotid endarterectomy without recurrent stenosis -Per neurology continue aspirin statin and Plavix -PT/OT recommend SNF placement.  Social worker consult -Diet as per SLP recommendations  -Echo shows EF of 55 to 60% with grade 1 diastolic dysfunction. -Antihypertensives being on hold for now.  Allow for permissive hypertension -A1c 5.7 -LDL 153 and cholesterol 220  Advanced dementia -Continue Aricept. -Fall precautions.  Delirium precautions.  Seizure disorder -Continue p.o. Keppra  Hyperlipidemia -Continue statin  Mild leukocytosis -Resolved  PT/OT recommended SNF but patient was denied.   DVT prophylaxis: Lovenox Code Status: DNR Family Communication: Spoke with [name].   Status is: Inpatient  Remains inpatient appropriate because:Unsafe d/c  plan   Dispo: The patient is from: Home              Anticipated d/c is to: TBD              Patient currently is medically stable to d/c. Unsafe discharge planning. TOC aware.    Difficult to place patient No    Body mass index is 24.2 kg/m.        Subjective: Sitting up in the bed, eating banana on her own.   Review of Systems Otherwise negative except as per HPI, including: General = no fevers, chills, dizziness,  fatigue HEENT/EYES = negative for loss of vision, double vision, blurred vision,  sore throa Cardiovascular= negative for chest pain, palpitation Respiratory/lungs= negative for shortness of breath, cough, wheezing; hemoptysis,  Gastrointestinal= negative for nausea, vomiting, abdominal pain Genitourinary= negative for Dysuria MSK = Negative for arthralgia, myalgias Neurology= Negative for headache, numbness, tingling  Psychiatry= Negative for suicidal and homocidal ideation Skin= Negative for Rash   Examination:  Constitutional: Not in acute distress; elderly frail.  Respiratory: Clear to auscultation bilaterally Cardiovascular: Normal sinus rhythm, no rubs Abdomen: Nontender nondistended good bowel sounds Musculoskeletal: No edema noted Skin: No rashes seen Neurologic: CN 2-12 grossly intact. Left facial droop, slurred peech Psychiatric: Normal judgment and insight. Alert and oriented x name only    Objective: Vitals:   10/04/20 1935 10/05/20 0100 10/05/20 0101 10/05/20 0500  BP: (!) 154/62 (!) 164/60    Pulse: 64 68    Resp:   (!) 23 15  Temp: 98.2 F (36.8 C) 98.4 F (36.9 C)    TempSrc: Oral Oral    SpO2: 97% 97%    Weight:      Height:  Intake/Output Summary (Last 24 hours) at 10/05/2020 1202 Last data filed at 10/04/2020 1952 Gross per 24 hour  Intake --  Output 450 ml  Net -450 ml   Filed Weights   10/01/20 1338  Weight: 64 kg     Data Reviewed:   CBC: Recent Labs  Lab 10/01/20 1044 10/03/20 0224  WBC 11.9*  9.3  NEUTROABS 8.0* 5.8  HGB 15.3* 13.2  HCT 46.4* 39.5  MCV 91.9 88.4  PLT 259 633   Basic Metabolic Panel: Recent Labs  Lab 10/01/20 1044 10/03/20 0224  NA 137 138  K 3.5 3.6  CL 104 109  CO2 21* 20*  GLUCOSE 124* 72  BUN 17 14  CREATININE 0.86 0.56  CALCIUM 9.7 9.2  MG  --  1.8   GFR: Estimated Creatinine Clearance: 56.5 mL/min (by C-G formula based on SCr of 0.56 mg/dL). Liver Function Tests: Recent Labs  Lab 10/01/20 1044 10/03/20 0224  AST 23 19  ALT 15 13  ALKPHOS 102 84  BILITOT 0.8 0.9  PROT 8.0 6.9  ALBUMIN 4.1 3.3*   No results for input(s): LIPASE, AMYLASE in the last 168 hours. No results for input(s): AMMONIA in the last 168 hours. Coagulation Profile: Recent Labs  Lab 10/01/20 1044  INR 0.9   Cardiac Enzymes: No results for input(s): CKTOTAL, CKMB, CKMBINDEX, TROPONINI in the last 168 hours. BNP (last 3 results) No results for input(s): PROBNP in the last 8760 hours. HbA1C: No results for input(s): HGBA1C in the last 72 hours. CBG: Recent Labs  Lab 10/01/20 1039  GLUCAP 117*   Lipid Profile: No results for input(s): CHOL, HDL, LDLCALC, TRIG, CHOLHDL, LDLDIRECT in the last 72 hours. Thyroid Function Tests: No results for input(s): TSH, T4TOTAL, FREET4, T3FREE, THYROIDAB in the last 72 hours. Anemia Panel: No results for input(s): VITAMINB12, FOLATE, FERRITIN, TIBC, IRON, RETICCTPCT in the last 72 hours. Sepsis Labs: No results for input(s): PROCALCITON, LATICACIDVEN in the last 168 hours.  Recent Results (from the past 240 hour(s))  SARS CORONAVIRUS 2 (TAT 6-24 HRS) Nasopharyngeal Nasopharyngeal Swab     Status: None   Collection Time: 10/01/20  2:21 PM   Specimen: Nasopharyngeal Swab  Result Value Ref Range Status   SARS Coronavirus 2 NEGATIVE NEGATIVE Final    Comment: (NOTE) SARS-CoV-2 target nucleic acids are NOT DETECTED.  The SARS-CoV-2 RNA is generally detectable in upper and lower respiratory specimens during the acute  phase of infection. Negative results do not preclude SARS-CoV-2 infection, do not rule out co-infections with other pathogens, and should not be used as the sole basis for treatment or other patient management decisions. Negative results must be combined with clinical observations, patient history, and epidemiological information. The expected result is Negative.  Fact Sheet for Patients: SugarRoll.be  Fact Sheet for Healthcare Providers: https://www.woods-mathews.com/  This test is not yet approved or cleared by the [place] FDA and  has been authorized for detection and/or diagnosis of SARS-CoV-2 by FDA under an Emergency Use Authorization (EUA). This EUA will remain  in effect (meaning this test can be used) for the duration of the COVID-19 declaration under Se ction 564(b)(1) of the Act, 21 U.S.C. section 360bbb-3(b)(1), unless the authorization is terminated or revoked sooner.  Performed at [facility], [place] [address] [place], [place] [address]          Radiology Studies: No results found.      Scheduled Meds: . aspirin EC  81 mg Oral Daily  .  atorvastatin  80 mg Oral q1800  . clopidogrel  75 mg Oral Daily  . donepezil  5 mg Oral QHS  . enoxaparin (LOVENOX) injection  40 mg Subcutaneous Q24H  . feeding supplement  237 mL Oral TID BM  . levETIRAcetam  750 mg Oral BID  . melatonin  3 mg Oral QHS  . multivitamin with minerals  1 tablet Oral Daily  . sodium chloride flush  3 mL Intravenous Once   Continuous Infusions:   LOS: 4 days   Time spent= 35 mins    Marrianne Sica Arsenio Loader, MD [facility]  If 7PM-7AM, please contact night-coverage  10/05/2020, 12:02 PM

## 2020-10-05 NOTE — Progress Notes (Signed)
Physical Therapy Treatment Patient Details Name: Kristy Cabrera MRN: [id number] DOB: 1951-02-08 Today's Date: 10/05/2020    History of Present Illness 70 y.o. female with medical history significant for advanced dementia, HTN, meningioma s/p resection, seizure, unspecified left MCA stroke s/p left-sided endarterectomy in 2020, HLD, currently enrolled in home hospice presented with new onset of left facial droop, left-sided weakness and slurred speech.  CT of the head showed acute stroke on right insula and operculum.    PT Comments    On arrival to room, pt resting but easily roused and agreeable to participate with therapy. Pt was able to ambulate 52ft x2 with assist for LE progression, weight shift, and RW management. Sit<>stand performed for neuromuscular re-ed. Pt follows commands with increased time, however noted a significant delay with sitting, at times requiring physical assist to sit down. Pt aware enough to verbalize when she needs to sit and rest. Will continue to follow acutely.     Follow Up Recommendations  SNF;Supervision/Assistance - 24 hour     Equipment Recommendations  None recommended by PT    Recommendations for Other Services       Precautions / Restrictions Precautions Precautions: Fall Precaution Comments: advanced dementia Restrictions Weight Bearing Restrictions: No    Mobility  Bed Mobility Overal bed mobility: Needs Assistance Bed Mobility: Supine to Sit     Supine to sit: HOB elevated;Min assist Sit to supine: Mod assist;HOB elevated   General bed mobility comments: pt grasping for hand to assist with trunk elevation, though could likely pull up with bed rails. Min A to elevate trunk and progress hips forward.    Transfers Overall transfer level: Needs assistance Equipment used: Rolling walker (2 wheeled) Transfers: Sit to/from Stand Sit to Stand: Min assist;Min guard         General transfer comment: pt completed multiple sit<>stands  during session with pt initally needing MINA from EOB but progressing to min guard as session progressed  Ambulation/Gait Ambulation/Gait assistance: Mod assist Gait Distance (Feet): 6 Feet (x2) Assistive device: Rolling walker (2 wheeled) Gait Pattern/deviations: Step-to pattern;Decreased step length - right;Decreased step length - left;Shuffle;Decreased stride length;Narrow base of support;Decreased stance time - right;Decreased weight shift to right;Decreased weight shift to left Gait velocity: decreased   General Gait Details: pt with shuffling gait. Mod A for RW management and proximity, especially with turns. Assist given at LLE to increase step length and assist with weight shifting. Chair close to follow as pt fatigues quickly.   Stairs             Wheelchair Mobility    Modified Rankin (Stroke Patients Only) Modified Rankin (Stroke Patients Only) Pre-Morbid Rankin Score: Moderate disability Modified Rankin: Moderately severe disability     Balance Overall balance assessment: Needs assistance Sitting-balance support: Feet supported Sitting balance-Leahy Scale: Good Sitting balance - Comments: Able to sit edge of bed with supervision, upon arrival pt with strong R lateral lean from upright position in bed. Able to self correct with cueing. Postural control: Right lateral lean Standing balance support: Single extremity supported;During functional activity Standing balance-Leahy Scale: Poor Standing balance comment: at least one UE supported during standing ADLs               High Level Balance Comments: Difficulty with backward walking            Cognition Arousal/Alertness: Awake/alert Behavior During Therapy: WFL for tasks assessed/performed Overall Cognitive Status: History of cognitive impairments - at baseline  General Comments: pt following one step commands with increased time needing tacile cues at  times for mobility tasks. pt able to state name, DOB and that she goes by [employer]"      Exercises Other Exercises Other Exercises: sit<>stand 5x for neuromuscular re-ed. Pt has delay/hesitation? before sitting.    General Comments General comments (skin integrity, edema, etc.): HR increasing to 135 bpm with mobility, HR 87 bpm at end of session from supine      Pertinent Vitals/Pain Pain Assessment: Faces Faces Pain Scale: No hurt Pain Location: R hand Pain Descriptors / Indicators: Aching Pain Intervention(s): Monitored during session    Home Living                      Prior Function            PT Goals (current goals can now be found in the care plan section) Acute Rehab PT Goals Patient Stated Goal: unable to state PT Goal Formulation: Patient unable to participate in goal setting Time For Goal Achievement: 10/16/20 Potential to Achieve Goals: Fair Progress towards PT goals: Progressing toward goals    Frequency    Min 3X/week      PT Plan Current plan remains appropriate    Co-evaluation              AM-PAC PT "6 Clicks" Mobility   Outcome Measure  Help needed turning from your back to your side while in a flat bed without using bedrails?: A Lot Help needed moving from lying on your back to sitting on the side of a flat bed without using bedrails?: A Lot Help needed moving to and from a bed to a chair (including a wheelchair)?: A Little Help needed standing up from a chair using your arms (e.g., wheelchair or bedside chair)?: A Little Help needed to walk in hospital room?: A Little Help needed climbing 3-5 steps with a railing? : Total 6 Click Score: 14    End of Session Equipment Utilized During Treatment: Gait belt Activity Tolerance: Patient tolerated treatment well Patient left: in chair;with call bell/phone within reach;with chair alarm set Nurse Communication: Mobility status PT Visit Diagnosis: Muscle weakness (generalized)  (M62.81);Difficulty in walking, not elsewhere classified (R26.2);Other abnormalities of gait and mobility (R26.89)     Time: 1111-1130 PT Time Calculation (min) (ACUTE ONLY): 19 min  Charges:  $Neuromuscular Re-education: 8-22 mins                     Benjiman Core, [place] Pager [phone] Acute Rehab   Allena Katz 10/05/2020, 12:13 PM

## 2020-10-06 DIAGNOSIS — E440 Moderate protein-calorie malnutrition: Secondary | ICD-10-CM | POA: Diagnosis not present

## 2020-10-06 DIAGNOSIS — Z515 Encounter for palliative care: Secondary | ICD-10-CM

## 2020-10-06 NOTE — Progress Notes (Signed)
PROGRESS NOTE    Kristy Cabrera  ASN:[id number] DOB: [birth year] DOA: 10/01/2020 PCP: [name], NP   Brief Narrative:  70 y.o.femalewith medical history significant ofadvanced dementia, HTN, meningioma s/p resection, seizure, unspecified left MCA stroke s/pleft-sided endarterectomy in 2020, HLD, currently enrolled in home hospice presented with new onset of left facial droop, left-sided weakness and slurred speech.  On presentation code stroke was called.  CT of the head showed acute stroke on right insula and operculum.  Neurology was consulted.  PT recommended SNF but patient was denied therefore TOC working on safe disposition.   Assessment & Plan:   Active Problems:   Acute ischemic stroke (HCC)   Stroke (cerebrum) (HCC)   Malnutrition of moderate degree   Acute right-sided stroke, right MCA territory -Continues to have slurred speech and left-sided facial droop. CT of the head was showing acute stroke in right insula and operculum.  MRI of brain showed multiple small acute right MCA territory infarcts.  CTA head showed new proximal right M2 occlusion with distal reconstitution with severe intracranial atherosclerosis and unchanged 50% mid right common carotid artery stenosis and patent left carotid endarterectomy without recurrent stenosis -Aspirin statin Plavix per neurology -PT/OT recommend SNF placement.  Social worker consult -Diet as per SLP recommendations  -Echo shows EF of 55 to 60% with grade 1 diastolic dysfunction. -Antihypertensives being on hold for now.  Allow for permissive hypertension -A1c 5.7 -LDL 153 and cholesterol 220  Advanced dementia -Continue Aricept. -Fall precautions.  Delirium precautions.  Seizure disorder -Continue p.o. Keppra  Hyperlipidemia -Continue statin  Mild leukocytosis -Resolved  PT/OT recommended SNF but patient was denied. TOC working on alternative options with the family.   DVT prophylaxis: Lovenox Code Status:  DNR Family Communication: [name] Updated.  Dr. Reesa Chew  Status is: Inpatient  Remains inpatient appropriate because:Unsafe d/c plan   Dispo: The patient is from: Home              Anticipated d/c is to: TBD              Patient currently is medically stable to d/c. Unsafe discharge planning. TOC aware. Working with the family for safe disposition.    Difficult to place patient No    Body mass index is 24.2 kg/m.     Subjective:  Sitting up in the chair, no complaints.   Examination: Constitutional: Not in acute distress Respiratory: Clear to auscultation bilaterally Cardiovascular: Normal sinus rhythm, no rubs Abdomen: Nontender nondistended good bowel sounds Musculoskeletal: No edema noted Skin: No rashes seen Neurologic: Slurry speech and left sided facial droop.  Psychiatric: Poor judgment and insight. Alert and oriented x Name   Objective: Vitals:   10/05/20 0500 10/05/20 1338 10/05/20 2222 10/06/20 0628  BP:  (!) 159/71 (!) 145/109 (!) 146/72  Pulse:  75 68 60  Resp: 15 20 20 19   Temp:  98.2 F (36.8 C) 98.2 F (36.8 C) 98.1 F (36.7 C)  TempSrc:  Oral  Oral  SpO2:  97% 97% 98%  Weight:      Height:       No intake or output data in the 24 hours ending 10/06/20 0921 Filed Weights   10/01/20 1338  Weight: 64 kg     Data Reviewed:   CBC: Recent Labs  Lab 10/01/20 1044 10/03/20 0224  WBC 11.9* 9.3  NEUTROABS 8.0* 5.8  HGB 15.3* 13.2  HCT 46.4* 39.5  MCV 91.9 88.4  PLT 259 193   Basic Metabolic  Panel: Recent Labs  Lab 10/01/20 1044 10/03/20 0224  NA 137 138  K 3.5 3.6  CL 104 109  CO2 21* 20*  GLUCOSE 124* 72  BUN 17 14  CREATININE 0.86 0.56  CALCIUM 9.7 9.2  MG  --  1.8   GFR: Estimated Creatinine Clearance: 56.5 mL/min (by C-G formula based on SCr of 0.56 mg/dL). Liver Function Tests: Recent Labs  Lab 10/01/20 1044 10/03/20 0224  AST 23 19  ALT 15 13  ALKPHOS 102 84  BILITOT 0.8 0.9  PROT 8.0 6.9  ALBUMIN 4.1 3.3*    No results for input(s): LIPASE, AMYLASE in the last 168 hours. No results for input(s): AMMONIA in the last 168 hours. Coagulation Profile: Recent Labs  Lab 10/01/20 1044  INR 0.9   Cardiac Enzymes: No results for input(s): CKTOTAL, CKMB, CKMBINDEX, TROPONINI in the last 168 hours. BNP (last 3 results) No results for input(s): PROBNP in the last 8760 hours. HbA1C: No results for input(s): HGBA1C in the last 72 hours. CBG: Recent Labs  Lab 10/01/20 1039  GLUCAP 117*   Lipid Profile: No results for input(s): CHOL, HDL, LDLCALC, TRIG, CHOLHDL, LDLDIRECT in the last 72 hours. Thyroid Function Tests: No results for input(s): TSH, T4TOTAL, FREET4, T3FREE, THYROIDAB in the last 72 hours. Anemia Panel: No results for input(s): VITAMINB12, FOLATE, FERRITIN, TIBC, IRON, RETICCTPCT in the last 72 hours. Sepsis Labs: No results for input(s): PROCALCITON, LATICACIDVEN in the last 168 hours.  Recent Results (from the past 240 hour(s))  SARS CORONAVIRUS 2 (TAT 6-24 HRS) Nasopharyngeal Nasopharyngeal Swab     Status: None   Collection Time: 10/01/20  2:21 PM   Specimen: Nasopharyngeal Swab  Result Value Ref Range Status   SARS Coronavirus 2 NEGATIVE NEGATIVE Final    Comment: (NOTE) SARS-CoV-2 target nucleic acids are NOT DETECTED.  The SARS-CoV-2 RNA is generally detectable in upper and lower respiratory specimens during the acute phase of infection. Negative results do not preclude SARS-CoV-2 infection, do not rule out co-infections with other pathogens, and should not be used as the sole basis for treatment or other patient management decisions. Negative results must be combined with clinical observations, patient history, and epidemiological information. The expected result is Negative.  Fact Sheet for Patients: SugarRoll.be  Fact Sheet for Healthcare Providers: https://www.woods-mathews.com/  This test is not yet approved or  cleared by the [place] FDA and  has been authorized for detection and/or diagnosis of SARS-CoV-2 by FDA under an Emergency Use Authorization (EUA). This EUA will remain  in effect (meaning this test can be used) for the duration of the COVID-19 declaration under Se ction 564(b)(1) of the Act, 21 U.S.C. section 360bbb-3(b)(1), unless the authorization is terminated or revoked sooner.  Performed at [facility], [place] [address], [place], [place] [address]          Radiology Studies: No results found.      Scheduled Meds: . aspirin EC  81 mg Oral Daily  . atorvastatin  80 mg Oral q1800  . clopidogrel  75 mg Oral Daily  . donepezil  5 mg Oral QHS  . enoxaparin (LOVENOX) injection  40 mg Subcutaneous Q24H  . feeding supplement  237 mL Oral TID BM  . levETIRAcetam  750 mg Oral BID  . melatonin  3 mg Oral QHS  . multivitamin with minerals  1 tablet Oral Daily  . sodium chloride flush  3 mL Intravenous Once   Continuous Infusions:   LOS: 5  days   Time spent= 35 mins    Masato Pettie Arsenio Loader, MD [facility]  If 7PM-7AM, please contact night-coverage  10/06/2020, 9:21 AM

## 2020-10-06 NOTE — Progress Notes (Signed)
  Speech Language Pathology Treatment: Dysphagia  Patient Details Name: Kristy Cabrera MRN: [id number] DOB: 02-21-1951 Today's Date: 10/06/2020 Time: 0354-6568 SLP Time Calculation (min) (ACUTE ONLY): 25 min  Assessment / Plan / Recommendation Clinical Impression  Pt was seen for treatment and was cooperative throughout the session. Pt and nursing reported that the pt has been tolerating the current diet without overt s/sx of aspiration; Claiborne Billings, RN further stated that the pt eats very well. Pt tolerated dysphagia 3 solids and regular texture solids, without symptoms of aspiration. Mastication was [facility] and oral clearnace was adequate. Pt intermittently exhibited oral holding with thin liquids via cup and to a lesser extent via straw. Coughing was most frequently noted when pt attempted to speak while holding liquids but this was also noted twice when pt added a thin liquid to a partially-masticated bolus. Thin liquids were tolerated without overt s/sx of aspiration outside of these conditions. Pt's current diet will be continued considering RN's report of adequate tolerance of meals, but SLP will continue to follow for swallowing for a short period of time to assess need for further intervention. Pt denied recall of dysarthria compensatory strategies or of her having worked with speech pathology this week. She was re-educated regarding the nature of dysarthria, and compensatory strategies to improve speech intelligibility. Pt verbalized understanding regarding all areas of education. She used compensatory strategies at the word level with 60% accuracy increasing to 100% with verbal prompts for overarticulation and vocal intensity. She demonstrated 50% accuracy at the phrase level increasing to 80% accuracy with model and prompts for rate and intensity. SLP will continue to follow pt.    HPI HPI: 70 y.o. female with medical history significant for advanced dementia, HTN, meningioma s/p resection, seizure,  unspecified left MCA stroke s/p left-sided endarterectomy in 2020, HLD, currently enrolled in home hospice presented with new onset of left facial droop, left-sided weakness and slurred speech.  CT of the head showed acute stroke on right insula and operculum. PTA pt walked 15' with rollator, I with bathing/dressing, fed herself regular diet.      SLP Plan  Continue with current plan of care       Recommendations  Diet recommendations: Regular;Thin liquid Liquids provided via: Cup;Straw Medication Administration: Whole meds with puree Supervision: Staff to assist with self feeding Postural Changes and/or Swallow Maneuvers: Seated upright 90 degrees                Oral Care Recommendations: Oral care BID Follow up Recommendations: Skilled Nursing facility SLP Visit Diagnosis: Dysphagia, unspecified (R13.10) Plan: Continue with current plan of care       Icis Budreau I. Hardin Negus, [place], [place] Office number [phone] Pager [place] 10/06/2020, 11:24 AM

## 2020-10-06 NOTE — Plan of Care (Signed)

## 2020-10-07 DIAGNOSIS — Z515 Encounter for palliative care: Secondary | ICD-10-CM | POA: Diagnosis not present

## 2020-10-07 DIAGNOSIS — E440 Moderate protein-calorie malnutrition: Secondary | ICD-10-CM | POA: Diagnosis not present

## 2020-10-07 DIAGNOSIS — J449 Chronic obstructive pulmonary disease, unspecified: Secondary | ICD-10-CM

## 2020-10-07 NOTE — Progress Notes (Addendum)
PROGRESS NOTE    Kristy Cabrera  KGU:[id number] DOB: September 16, 1950 DOA: 10/01/2020 PCP: [name], NP   Brief Narrative:  70 y.o.femalewith medical history significant ofadvanced dementia, HTN, meningioma s/p resection, seizure, unspecified left MCA stroke s/pleft-sided endarterectomy in 2020, HLD, currently enrolled in home hospice presented with new onset of left facial droop, left-sided weakness and slurred speech.  On presentation code stroke was called.  CT of the head showed acute stroke on right insula and operculum.  Neurology was consulted.  PT recommended SNF but patient was denied therefore TOC working on safe disposition.   Assessment & Plan:   Active Problems:   COPD (chronic obstructive pulmonary disease) (HCC)   Acute ischemic stroke (HCC)   Stroke (cerebrum) (HCC)   Malnutrition of moderate degree   Hospice care patient   Acute right-sided stroke, right MCA territory -Continues to have slurred speech and left-sided facial droop. CT of the head was showing acute stroke in right insula and operculum.  MRI of brain showed multiple small acute right MCA territory infarcts.  CTA head showed new proximal right M2 occlusion with distal reconstitution with severe intracranial atherosclerosis and unchanged 50% mid right common carotid artery stenosis and patent left carotid endarterectomy without recurrent stenosis -Aspirin statin Plavix per neurology -PT/OT recommend SNF placement.  Social worker consult -Diet as per SLP recommendations  -Echo shows EF of 55 to 60% with grade 1 diastolic dysfunction. -Antihypertensives being on hold for now.  Allow for permissive hypertension -A1c 5.7 -LDL 153 and cholesterol 220  Advanced dementia -Continue Aricept. -Fall precautions.  Delirium precautions.  Seizure disorder -Continue p.o. Keppra  Hyperlipidemia -Continue statin  PT/OT recommended SNF but patient was denied. TOC working on alternative options with the family.    DVT prophylaxis: Lovenox Code Status: DNR Family Communication: Updated [name].   Status is: Inpatient  Remains inpatient appropriate because:Unsafe d/c plan   Dispo: The patient is from: Home              Anticipated d/c is to: TBD              Patient currently is medically stable to d/c. Unsafe discharge planning. TOC aware. Working with the family for safe disposition.    Difficult to place patient No    Body mass index is 24.2 kg/m.   Subjective:  Sitting up in bed, no complaints. Doing well.   Examination: Constitutional: Not in acute distress Respiratory: Clear to auscultation bilaterally Cardiovascular: Normal sinus rhythm, no rubs Abdomen: Nontender nondistended good bowel sounds Musculoskeletal: No edema noted Skin: No rashes seen Neurologic: left sided weakness, facial droop and slurred speech Psychiatric: Normal judgment and insight. Alert and oriented x 2. Normal mood.    Objective: Vitals:   10/06/20 0628 10/06/20 1420 10/06/20 2118 10/07/20 0521  BP: (!) 146/72 (!) 158/97 (!) 148/87   Pulse: 60 (!) 59 62 68  Resp: 19 16 18 18   Temp: 98.1 F (36.7 C) 98.3 F (36.8 C) 98.5 F (36.9 C) 98.4 F (36.9 C)  TempSrc: Oral  Oral Oral  SpO2: 98% 98%  94%  Weight:      Height:       No intake or output data in the 24 hours ending 10/07/20 1019 Filed Weights   10/01/20 1338  Weight: 64 kg     Data Reviewed:   CBC: Recent Labs  Lab 10/01/20 1044 10/03/20 0224  WBC 11.9* 9.3  NEUTROABS 8.0* 5.8  HGB 15.3* 13.2  HCT 46.4*  39.5  MCV 91.9 88.4  PLT 259 397   Basic Metabolic Panel: Recent Labs  Lab 10/01/20 1044 10/03/20 0224  NA 137 138  K 3.5 3.6  CL 104 109  CO2 21* 20*  GLUCOSE 124* 72  BUN 17 14  CREATININE 0.86 0.56  CALCIUM 9.7 9.2  MG  --  1.8   GFR: Estimated Creatinine Clearance: 56.5 mL/min (by C-G formula based on SCr of 0.56 mg/dL). Liver Function Tests: Recent Labs  Lab 10/01/20 1044 10/03/20 0224  AST 23 19   ALT 15 13  ALKPHOS 102 84  BILITOT 0.8 0.9  PROT 8.0 6.9  ALBUMIN 4.1 3.3*   No results for input(s): LIPASE, AMYLASE in the last 168 hours. No results for input(s): AMMONIA in the last 168 hours. Coagulation Profile: Recent Labs  Lab 10/01/20 1044  INR 0.9   Cardiac Enzymes: No results for input(s): CKTOTAL, CKMB, CKMBINDEX, TROPONINI in the last 168 hours. BNP (last 3 results) No results for input(s): PROBNP in the last 8760 hours. HbA1C: No results for input(s): HGBA1C in the last 72 hours. CBG: Recent Labs  Lab 10/01/20 1039  GLUCAP 117*   Lipid Profile: No results for input(s): CHOL, HDL, LDLCALC, TRIG, CHOLHDL, LDLDIRECT in the last 72 hours. Thyroid Function Tests: No results for input(s): TSH, T4TOTAL, FREET4, T3FREE, THYROIDAB in the last 72 hours. Anemia Panel: No results for input(s): VITAMINB12, FOLATE, FERRITIN, TIBC, IRON, RETICCTPCT in the last 72 hours. Sepsis Labs: No results for input(s): PROCALCITON, LATICACIDVEN in the last 168 hours.  Recent Results (from the past 240 hour(s))  SARS CORONAVIRUS 2 (TAT 6-24 HRS) Nasopharyngeal Nasopharyngeal Swab     Status: None   Collection Time: 10/01/20  2:21 PM   Specimen: Nasopharyngeal Swab  Result Value Ref Range Status   SARS Coronavirus 2 NEGATIVE NEGATIVE Final    Comment: (NOTE) SARS-CoV-2 target nucleic acids are NOT DETECTED.  The SARS-CoV-2 RNA is generally detectable in upper and lower respiratory specimens during the acute phase of infection. Negative results do not preclude SARS-CoV-2 infection, do not rule out co-infections with other pathogens, and should not be used as the sole basis for treatment or other patient management decisions. Negative results must be combined with clinical observations, patient history, and epidemiological information. The expected result is Negative.  Fact Sheet for Patients: SugarRoll.be  Fact Sheet for Healthcare  Providers: https://www.woods-mathews.com/  This test is not yet approved or cleared by the [place] FDA and  has been authorized for detection and/or diagnosis of SARS-CoV-2 by FDA under an Emergency Use Authorization (EUA). This EUA will remain  in effect (meaning this test can be used) for the duration of the COVID-19 declaration under Se ction 564(b)(1) of the Act, 21 U.S.C. section 360bbb-3(b)(1), unless the authorization is terminated or revoked sooner.  Performed at [facility], [place] [address] [place], [place] [address]          Radiology Studies: No results found.      Scheduled Meds: . aspirin EC  81 mg Oral Daily  . atorvastatin  80 mg Oral q1800  . clopidogrel  75 mg Oral Daily  . donepezil  5 mg Oral QHS  . enoxaparin (LOVENOX) injection  40 mg Subcutaneous Q24H  . feeding supplement  237 mL Oral TID BM  . levETIRAcetam  750 mg Oral BID  . melatonin  3 mg Oral QHS  . multivitamin with minerals  1 tablet Oral Daily  . sodium chloride flush  3 mL Intravenous Once   Continuous Infusions:   LOS: 6 days   Time spent= 35 mins    Jaysiah Marchetta Arsenio Loader, MD [facility]  If 7PM-7AM, please contact night-coverage  10/07/2020, 10:19 AM

## 2020-10-08 NOTE — Progress Notes (Signed)
Physical Therapy Treatment Patient Details Name: Kristy Cabrera MRN: [id number] DOB: 09/13/1950 Today's Date: 10/08/2020    History of Present Illness 70 y.o. female with medical history significant for advanced dementia, HTN, meningioma s/p resection, seizure, unspecified left MCA stroke s/p left-sided endarterectomy in 2020, HLD, currently enrolled in home hospice presented with new onset of left facial droop, left-sided weakness and slurred speech.  CT of the head showed acute stroke on right insula and operculum.    PT Comments    Pt continues to demonstrate poor motor control/sequencing, body awareness, deficits awareness, strength, endurance, and balance that impact her mobility. Pt is at high risk for falls. Pt needing extensive cues and physical assistance to shift weight bilaterally with gait with her resisting when going to the R, possible slight pushing syndrome? Pt continues to display shuffling gait and specific difficulty with stepping posteriorly and preparing to transfer stand > sit. Will continue to follow acutely. Current recommendations remain appropriate.   Follow Up Recommendations  SNF;Supervision/Assistance - 24 hour     Equipment Recommendations  None recommended by PT    Recommendations for Other Services       Precautions / Restrictions Precautions Precautions: Fall Precaution Comments: advanced dementia Restrictions Weight Bearing Restrictions: No    Mobility  Bed Mobility Overal bed mobility: Needs Assistance Bed Mobility: Supine to Sit     Supine to sit: HOB elevated;Min assist     General bed mobility comments: pt grasping for hand to assist with trunk elevation, though could likely pull up with bed rails. Min A to elevate trunk and progress hips forward.    Transfers Overall transfer level: Needs assistance Equipment used: Rolling walker (2 wheeled) Transfers: Sit to/from Stand Sit to Stand: Min assist         General transfer  comment: pt completed multiple sit<>stands during session with pt needing minA to power up to stand but min-modA to flex trunk and assist with posterior lean to sit down.  Ambulation/Gait Ambulation/Gait assistance: Mod assist Gait Distance (Feet): 4 Feet (x2 bouts of ~4 ft > ~3 ft) Assistive device: Rolling walker (2 wheeled) Gait Pattern/deviations: Step-to pattern;Decreased step length - right;Decreased step length - left;Shuffle;Decreased stride length;Narrow base of support;Decreased stance time - right;Decreased weight shift to right;Decreased weight shift to left;Trunk flexed Gait velocity: decreased Gait velocity interpretation: <1.31 ft/sec, indicative of household ambulator General Gait Details: pt with shuffling gait, demonstrating poor motor sequencing despite max multi-modal cues and extensive physical assistance. Pt with poor weight shifting, decreased bil stance time, and decreased bil step length. Pt resisting R weight shifting more than L. During 2nd bout stopped forward progression to focus on lateral weight shifting, cuing pt to push R hip into PT's hand on her lateral aspect, min success.   Stairs             Wheelchair Mobility    Modified Rankin (Stroke Patients Only) Modified Rankin (Stroke Patients Only) Pre-Morbid Rankin Score: Moderate disability Modified Rankin: Moderately severe disability     Balance Overall balance assessment: Needs assistance Sitting-balance support: Feet supported Sitting balance-Leahy Scale: Good Sitting balance - Comments: Able to sit edge of bed with supervision   Standing balance support: During functional activity;Bilateral upper extremity supported Standing balance-Leahy Scale: Poor Standing balance comment: Reliant on UE support.               High Level Balance Comments: Difficulty with backward walking            Cognition  Arousal/Alertness: Awake/alert Behavior During Therapy: WFL for tasks  assessed/performed Overall Cognitive Status: History of cognitive impairments - at baseline                                 General Comments: pt following one step commands with increased time needing tacile cues at times for mobility tasks to initiate. Poor sequencing of movements even with extensive cues and physical assist.      Exercises Other Exercises Other Exercises: sit<>stand 4x for neuromuscular re-ed. Pt has delay/hesitation/poor sequencing/motor control to flex and step posterior before sitting.    General Comments        Pertinent Vitals/Pain Pain Assessment: Faces Faces Pain Scale: Hurts a little bit Pain Location: generalized with mobility Pain Descriptors / Indicators: Discomfort;Grimacing Pain Intervention(s): Limited activity within patient's tolerance;Monitored during session;Repositioned    Home Living                      Prior Function            PT Goals (current goals can now be found in the care plan section) Acute Rehab PT Goals Patient Stated Goal: to get up PT Goal Formulation: Patient unable to participate in goal setting Time For Goal Achievement: 10/16/20 Potential to Achieve Goals: Fair Progress towards PT goals: Progressing toward goals (slowly)    Frequency    Min 3X/week      PT Plan Current plan remains appropriate    Co-evaluation              AM-PAC PT "6 Clicks" Mobility   Outcome Measure  Help needed turning from your back to your side while in a flat bed without using bedrails?: A Lot Help needed moving from lying on your back to sitting on the side of a flat bed without using bedrails?: A Lot Help needed moving to and from a bed to a chair (including a wheelchair)?: A Little Help needed standing up from a chair using your arms (e.g., wheelchair or bedside chair)?: A Little Help needed to walk in hospital room?: A Little Help needed climbing 3-5 steps with a railing? : Total 6 Click Score:  14    End of Session Equipment Utilized During Treatment: Gait belt Activity Tolerance: Patient tolerated treatment well Patient left: in chair;with call bell/phone within reach;with chair alarm set Nurse Communication: Mobility status;Other (comment) (purewick suction not working) PT Visit Diagnosis: Muscle weakness (generalized) (M62.81);Difficulty in walking, not elsewhere classified (R26.2);Other abnormalities of gait and mobility (R26.89)     Time: 1525-1550 PT Time Calculation (min) (ACUTE ONLY): 25 min  Charges:  $Gait Training: 8-22 mins $Therapeutic Activity: 8-22 mins                     Moishe Spice, PT, DPT Acute Rehabilitation Services  Pager: [phone] Office: [place] 10/08/2020, 3:58 PM

## 2020-10-09 ENCOUNTER — Inpatient Hospital Stay (HOSPITAL_COMMUNITY)

## 2020-10-09 DIAGNOSIS — J449 Chronic obstructive pulmonary disease, unspecified: Secondary | ICD-10-CM | POA: Diagnosis not present

## 2020-10-09 DIAGNOSIS — E440 Moderate protein-calorie malnutrition: Secondary | ICD-10-CM | POA: Diagnosis not present

## 2020-10-09 DIAGNOSIS — Z515 Encounter for palliative care: Secondary | ICD-10-CM | POA: Diagnosis not present

## 2020-10-09 LAB — BASIC METABOLIC PANEL
Anion gap: 6 (ref 5–15)
BUN: 29 mg/dL — ABNORMAL HIGH (ref 8–23)
CO2: 24 mmol/L (ref 22–32)
Calcium: 9.7 mg/dL (ref 8.9–10.3)
Chloride: 110 mmol/L (ref 98–111)
Creatinine, Ser: 0.78 mg/dL (ref 0.44–1.00)
GFR, Estimated: >60 mL/min (ref >60)
Glucose, Bld: 96 mg/dL (ref 70–99)
Potassium: 4 mmol/L (ref 3.5–5.1)
Sodium: 140 mmol/L (ref 135–145)

## 2020-10-09 LAB — MAGNESIUM: Magnesium: 2.1 mg/dL (ref 1.7–2.4)

## 2020-10-09 MED ORDER — RESOURCE THICKENUP CLEAR PO POWD
ORAL | Status: DC | PRN
  Filled 2020-10-09: qty 125

## 2020-10-09 NOTE — Progress Notes (Signed)
Modified Barium Swallow Progress Note  Patient Details  Name: Kristy Cabrera MRN: [id number] Date of Birth: 1950-09-24  Today's Date: 10/09/2020  Modified Barium Swallow completed.  Full report located under Chart Review in the Imaging Section.  Brief recommendations include the following:  Clinical Impression  Pt presented with oropharyngeal dysphagia characterized by impaired posterior bolus propulsion, oral holding, and impaired timing of the swallow. She demonstrated difficulty with A-P transport and inconsistent sensed aspiration (PAS 7) of thin liquids. Pt was unable to demonstrate compensatory strategies due to her difficulty following commands. Despite repeated attempts and use of various liquids and puree, pt was unable to demonstrate A-P transport of the barium tablet and ultimately masticated it. A regular texture diet with nectar thick liquids is recommended at this time. Pt's husband was contacted via phone and educated regarding the results of the study and recommendations. He verbalized understanding and agreement. SLP will follow for dysphagia treatment.   Swallow Evaluation Recommendations       SLP Diet Recommendations: Regular solids;Nectar thick liquid   Liquid Administration via: Cup;Straw   Medication Administration: Crushed with puree   Supervision: Staff to assist with self feeding   Compensations: Slow rate;Small sips/bites   Postural Changes: Seated upright at 90 degrees   Oral Care Recommendations: Oral care BID      Shanika I. Hardin Negus, [place], [place] Office number [phone] Pager [place] 10/09/2020,2:48 PM

## 2020-10-09 NOTE — Plan of Care (Signed)
  Problem: Clinical Measurements: Goal: Respiratory complications will improve Outcome: Progressing   Problem: Clinical Measurements: Goal: Cardiovascular complication will be avoided Outcome: Progressing   Problem: Activity: Goal: Risk for activity intolerance will decrease Outcome: Progressing   Problem: Coping: Goal: Level of anxiety will decrease Outcome: Progressing

## 2020-10-09 NOTE — Progress Notes (Signed)
PT Cancellation Note  Patient Details Name: Kristy Cabrera MRN: [id number] DOB: 1951/03/28   Cancelled Treatment:    Reason Eval/Treat Not Completed: Patient at procedure or test/unavailable. Pt off floor for swallowing study. Will follow up today as time allows vs another date.   Willow Ora, PTA, [place] [phone] 10/09/20, 1:00 PM   Willow Ora 10/09/2020, 1:00 PM

## 2020-10-09 NOTE — Progress Notes (Signed)
  Speech Language Pathology Treatment: Dysphagia  Patient Details Name: Kristy Cabrera MRN: [id number] DOB: 09-Nov-1950 Today's Date: 10/09/2020 Time: 6734-1937 SLP Time Calculation (min) (ACUTE ONLY): 17.37 min  Assessment / Plan / Recommendation Clinical Impression  Pt was seen for dysphagia treatment. She was alert and cooperative during the session. Pt denied recall of dysarthria treatment or compensatory strategies despite education. She was re-educated regarding compensatory strategies. However, considering her baseline advanced dementia and her performance thus far, SLP questions her rehab potential for dysarthria. Pt inconsistently demonstrated coughing with thin liquids and regular texture solids despite use of reduced bolus sizes and adequate positioning. Oral phase was [facility] for mastication and oral clearance. A modified barium swallow study is recommended to further assess swallow physiology. It is scheduled for 1300.    HPI HPI: 70 y.o. female with medical history significant for advanced dementia, HTN, meningioma s/p resection, seizure, unspecified left MCA stroke s/p left-sided endarterectomy in 2020, HLD, currently enrolled in home hospice presented with new onset of left facial droop, left-sided weakness and slurred speech.  CT of the head showed acute stroke on right insula and operculum. PTA pt fed herself a regular diet.      SLP Plan  MBS       Recommendations  Diet recommendations:  (continue current diet until MBS completed) Liquids provided via: Cup;Straw Medication Administration: Whole meds with puree Supervision: Staff to assist with self feeding Postural Changes and/or Swallow Maneuvers: Seated upright 90 degrees                Oral Care Recommendations: Oral care BID Follow up Recommendations: Skilled Nursing facility SLP Visit Diagnosis: Dysphagia, unspecified (R13.10) Plan: MBS       Shanika I. Hardin Negus, [place], [place] Office number [phone] Pager [place] 10/09/2020, 11:47 AM

## 2020-10-09 NOTE — Progress Notes (Signed)
PROGRESS NOTE    Kristy Cabrera  GBE:[id number] DOB: 1951/04/01 DOA: 10/01/2020 PCP: [name], NP   Brief Narrative:  70 y.o.femalewith medical history significant ofadvanced dementia, HTN, meningioma s/p resection, seizure, unspecified left MCA stroke s/pleft-sided endarterectomy in 2020, HLD, currently enrolled in home hospice presented with new onset of left facial droop, left-sided weakness and slurred speech.  On presentation code stroke was called.  CT of the head showed acute stroke on right insula and operculum.  Neurology was consulted.  PT recommended SNF but patient was denied therefore TOC working on safe disposition.   Assessment & Plan:   Active Problems:   COPD (chronic obstructive pulmonary disease) (HCC)   Acute ischemic stroke (HCC)   Stroke (cerebrum) (HCC)   Malnutrition of moderate degree   Hospice care patient   Acute right-sided stroke, right MCA territory -Continues to have slurred speech and left-sided facial droop. CT of the head was showing acute stroke in right insula and operculum.  MRI of brain showed multiple small acute right MCA territory infarcts.  CTA head showed new proximal right M2 occlusion with distal reconstitution with severe intracranial atherosclerosis and unchanged 50% mid right common carotid artery stenosis and patent left carotid endarterectomy without recurrent stenosis -Aspirin statin Plavix per neurology -PT/OT recommend SNF placement.  Social worker consult -Diet as per SLP recommendations  -Echo shows EF of 55 to 60% with grade 1 diastolic dysfunction. -Antihypertensives being on hold for now.  Allow for permissive hypertension -A1c 5.7 -LDL 153 and cholesterol 220  Advanced dementia -Continue Aricept. -Fall precautions.  Delirium precautions.  Seizure disorder -Continue p.o. Keppra  Hyperlipidemia -Continue statin  PT/OT recommended SNF but patient was denied. TOC working on alternative options with the family.    DVT prophylaxis: Lovenox Code Status: DNR Family Communication: Updated [name] periodically  Status is: Inpatient  Remains inpatient appropriate because:Unsafe d/c plan   Dispo: The patient is from: Home              Anticipated d/c is to: TBD              Patient currently is medically stable to d/c. Unsafe discharge planning. TOC aware. Working with the family for safe disposition.    Difficult to place patient No    Body mass index is 24.2 kg/m.   Subjective:  Ate her breakfast this morning no complaints.  Examination: Constitutional: Not in acute distress Constitutional: Not in acute distress Respiratory: Clear to auscultation bilaterally Cardiovascular: Normal sinus rhythm, no rubs Abdomen: Nontender nondistended good bowel sounds Musculoskeletal: No edema noted Skin: No rashes seen Neurologic: Left-sided weakness, facial droop and slurred speech Psychiatric: Normal judgment and insight. Alert and oriented x 3. Normal mood. Objective: Vitals:   10/08/20 0351 10/08/20 1241 10/08/20 2205 10/09/20 0333  BP: (!) 142/84 107/67 (!) 152/77 (!) 155/96  Pulse: 64 75 68 66  Resp: 18 20 20 18   Temp: 97.8 F (36.6 C) 98.5 F (36.9 C) 97.6 F (36.4 C) 97.6 F (36.4 C)  TempSrc: Axillary Oral    SpO2: 100% 96% 96% 98%  Weight:      Height:       No intake or output data in the 24 hours ending 10/09/20 1204 Filed Weights   10/01/20 1338  Weight: 64 kg     Data Reviewed:   CBC: Recent Labs  Lab 10/03/20 0224  WBC 9.3  NEUTROABS 5.8  HGB 13.2  HCT 39.5  MCV 88.4  PLT 301  Basic Metabolic Panel: Recent Labs  Lab 10/03/20 0224 10/09/20 0140  NA 138 140  K 3.6 4.0  CL 109 110  CO2 20* 24  GLUCOSE 72 96  BUN 14 29*  CREATININE 0.56 0.78  CALCIUM 9.2 9.7  MG 1.8 2.1   GFR: Estimated Creatinine Clearance: 56.5 mL/min (by C-G formula based on SCr of 0.78 mg/dL). Liver Function Tests: Recent Labs  Lab 10/03/20 0224  AST 19  ALT 13  ALKPHOS  84  BILITOT 0.9  PROT 6.9  ALBUMIN 3.3*   No results for input(s): LIPASE, AMYLASE in the last 168 hours. No results for input(s): AMMONIA in the last 168 hours. Coagulation Profile: No results for input(s): INR, PROTIME in the last 168 hours. Cardiac Enzymes: No results for input(s): CKTOTAL, CKMB, CKMBINDEX, TROPONINI in the last 168 hours. BNP (last 3 results) No results for input(s): PROBNP in the last 8760 hours. HbA1C: No results for input(s): HGBA1C in the last 72 hours. CBG: No results for input(s): GLUCAP in the last 168 hours. Lipid Profile: No results for input(s): CHOL, HDL, LDLCALC, TRIG, CHOLHDL, LDLDIRECT in the last 72 hours. Thyroid Function Tests: No results for input(s): TSH, T4TOTAL, FREET4, T3FREE, THYROIDAB in the last 72 hours. Anemia Panel: No results for input(s): VITAMINB12, FOLATE, FERRITIN, TIBC, IRON, RETICCTPCT in the last 72 hours. Sepsis Labs: No results for input(s): PROCALCITON, LATICACIDVEN in the last 168 hours.  Recent Results (from the past 240 hour(s))  SARS CORONAVIRUS 2 (TAT 6-24 HRS) Nasopharyngeal Nasopharyngeal Swab     Status: None   Collection Time: 10/01/20  2:21 PM   Specimen: Nasopharyngeal Swab  Result Value Ref Range Status   SARS Coronavirus 2 NEGATIVE NEGATIVE Final    Comment: (NOTE) SARS-CoV-2 target nucleic acids are NOT DETECTED.  The SARS-CoV-2 RNA is generally detectable in upper and lower respiratory specimens during the acute phase of infection. Negative results do not preclude SARS-CoV-2 infection, do not rule out co-infections with other pathogens, and should not be used as the sole basis for treatment or other patient management decisions. Negative results must be combined with clinical observations, patient history, and epidemiological information. The expected result is Negative.  Fact Sheet for Patients: SugarRoll.be  Fact Sheet for Healthcare  Providers: https://www.woods-mathews.com/  This test is not yet approved or cleared by the [place] FDA and  has been authorized for detection and/or diagnosis of SARS-CoV-2 by FDA under an Emergency Use Authorization (EUA). This EUA will remain  in effect (meaning this test can be used) for the duration of the COVID-19 declaration under Se ction 564(b)(1) of the Act, 21 U.S.C. section 360bbb-3(b)(1), unless the authorization is terminated or revoked sooner.  Performed at [facility], [place] [address] [place], [place] [address]          Radiology Studies: No results found.      Scheduled Meds: . aspirin EC  81 mg Oral Daily  . atorvastatin  80 mg Oral q1800  . clopidogrel  75 mg Oral Daily  . donepezil  5 mg Oral QHS  . enoxaparin (LOVENOX) injection  40 mg Subcutaneous Q24H  . feeding supplement  237 mL Oral TID BM  . levETIRAcetam  750 mg Oral BID  . melatonin  3 mg Oral QHS  . multivitamin with minerals  1 tablet Oral Daily  . sodium chloride flush  3 mL Intravenous Once   Continuous Infusions:   LOS: 8 days   Time spent= 35 mins  Ankit Arsenio Loader, MD [facility]  If 7PM-7AM, please contact night-coverage  10/09/2020, 12:04 PM

## 2020-10-10 DIAGNOSIS — E440 Moderate protein-calorie malnutrition: Secondary | ICD-10-CM | POA: Diagnosis not present

## 2020-10-10 DIAGNOSIS — J449 Chronic obstructive pulmonary disease, unspecified: Secondary | ICD-10-CM | POA: Diagnosis not present

## 2020-10-10 NOTE — Progress Notes (Signed)
Physical Therapy Treatment Patient Details Name: Kristy Cabrera MRN: [id number] DOB: 09/05/1950 Today's Date: 10/10/2020    History of Present Illness 70 y.o. female presented with new onset of left facial droop, left-sided weakness and slurred speech.  CT of the head showed acute stroke on right insula and operculum PMH:  advanced dementia, HTN, meningioma s/p resection, seizure, unspecified left MCA stroke s/p left-sided endarterectomy in 2020, HLD, currently enrolled in home hospice    PT Comments    Pt admitted with above diagnosis. Pt was able to ambulate with mod assist of 2 with max cues as pt difficult to move her feet. Does best with verbal cueing and incr time taking 10 min at least to walk to sink and back to recliner.  Pt may be close to baseline. Will continue to follow acutely.  Pt currently with functional limitations due to balance and endurance deficits. Pt will benefit from skilled PT to increase their independence and safety with mobility to allow discharge to the venue listed below.     Follow Up Recommendations  SNF;Supervision/Assistance - 24 hour     Equipment Recommendations  None recommended by PT    Recommendations for Other Services       Precautions / Restrictions Precautions Precautions: Fall Precaution Comments: advanced dementia Restrictions Weight Bearing Restrictions: No    Mobility  Bed Mobility Overal bed mobility: Needs Assistance Bed Mobility: Supine to Sit;Rolling Rolling: Supervision;Min guard   Supine to sit: HOB elevated;Min assist Sit to supine: Mod assist;HOB elevated   General bed mobility comments: Pt had BM on arrival and this PT had her roll left and right to be cleaned prior to getiting to EOB. Pt grasping for hand to assist with trunk elevation, though could likely pull up with bed rails. Min A to elevate trunk and progress hips forward.    Transfers Overall transfer level: Needs assistance Equipment used: Rolling walker (2  wheeled) Transfers: Sit to/from Stand Sit to Stand: Min assist;Mod assist;+2 safety/equipment;From elevated surface         General transfer comment: pt completed multiple sit<>stands during session with pt needing minA to power up to stand but min-modA to flex trunk and assist with posterior lean to sit down.  Ambulation/Gait Ambulation/Gait assistance: Mod assist;+2 safety/equipment Gait Distance (Feet): 8 Feet (8 feet x 2) Assistive device: Rolling walker (2 wheeled) Gait Pattern/deviations: Step-to pattern;Decreased step length - right;Decreased step length - left;Shuffle;Decreased stride length;Decreased stance time - right;Decreased weight shift to right;Decreased weight shift to left;Trunk flexed;Drifts right/left;Wide base of support;Leaning posteriorly;Staggering left;Staggering right;Festinating Gait velocity: decreased Gait velocity interpretation: 1.31 - 2.62 ft/sec, indicative of limited community ambulator General Gait Details: OT came in for walking with pt with PT.  Pt with shuffling gait, demonstrating poor motor sequencing despite max multi-modal cues and extensive physical assistance. Pt with poor weight shifting, decreased bil stance time, and decreased bil step length. Pt resisting R weight shifting more than L. Pt responds best to verbal cuing "walk" with constant need to repeat this as if you try to help her weight shift or advance feet for her she doesnt continue. Walked to sink and washed face and brushed hair and then walked back to chair with +2 assist.   Stairs             Wheelchair Mobility    Modified Rankin (Stroke Patients Only) Modified Rankin (Stroke Patients Only) Pre-Morbid Rankin Score: Moderate disability Modified Rankin: Moderately severe disability     Balance Overall balance assessment:  Needs assistance Sitting-balance support: Feet supported;No upper extremity supported Sitting balance-Leahy Scale: Good Sitting balance - Comments:  Able to sit edge of bed with supervision Postural control: Right lateral lean;Posterior lean Standing balance support: During functional activity;Bilateral upper extremity supported Standing balance-Leahy Scale: Poor Standing balance comment: Reliant on UE support and external support               High Level Balance Comments: Difficulty with backward walking            Cognition Arousal/Alertness: Awake/alert Behavior During Therapy: WFL for tasks assessed/performed Overall Cognitive Status: History of cognitive impairments - at baseline                                 General Comments: pt following one step commands with increased time needing tactile cues at times for mobility tasks to initiate. Poor sequencing of movements even with extensive cues and physical assist.      Exercises      General Comments General comments (skin integrity, edema, etc.): Pulse ox would not register with pt not in resp distress; other VSS      Pertinent Vitals/Pain Pain Assessment: No/denies pain    Home Living                      Prior Function            PT Goals (current goals can now be found in the care plan section) Progress towards PT goals: Progressing toward goals    Frequency    Min 3X/week      PT Plan Current plan remains appropriate    Co-evaluation PT/OT/SLP Co-Evaluation/Treatment: Yes Reason for Co-Treatment: For patient/therapist safety PT goals addressed during session: Mobility/safety with mobility        AM-PAC PT "6 Clicks" Mobility   Outcome Measure  Help needed turning from your back to your side while in a flat bed without using bedrails?: A Little Help needed moving from lying on your back to sitting on the side of a flat bed without using bedrails?: A Lot Help needed moving to and from a bed to a chair (including a wheelchair)?: A Lot Help needed standing up from a chair using your arms (e.g., wheelchair or bedside  chair)?: A Lot Help needed to walk in hospital room?: A Lot Help needed climbing 3-5 steps with a railing? : Total 6 Click Score: 12    End of Session Equipment Utilized During Treatment: Gait belt Activity Tolerance: Patient tolerated treatment well Patient left: in chair;with call bell/phone within reach;with chair alarm set Nurse Communication: Mobility status PT Visit Diagnosis: Muscle weakness (generalized) (M62.81);Difficulty in walking, not elsewhere classified (R26.2);Other abnormalities of gait and mobility (R26.89)     Time: 9381-0175 PT Time Calculation (min) (ACUTE ONLY): 38 min  Charges:  $Gait Training: 23-37 mins                     Dawn M,PT Acute Rehab Services [phone] (pager)   [name] 10/10/2020, 12:07 PM

## 2020-10-10 NOTE — Progress Notes (Signed)
Occupational Therapy Treatment Patient Details Name: Kristy Cabrera MRN: [id number] DOB: 10-03-1950 Today's Date: 10/10/2020    History of present illness 70 y.o. female presented with new onset of left facial droop, left-sided weakness and slurred speech.  CT of the head showed acute stroke on right insula and operculum PMH:  advanced dementia, HTN, meningioma s/p resection, seizure, unspecified left MCA stroke s/p left-sided endarterectomy in 2020, HLD, currently enrolled in home hospice   OT comments  Pt making steady progress towards OT goals this session. Dovetailed session with PT in order to progress functional mobility as pt benefits from +2 assist for safety during all mobility tasks. Pt requires step by step multimodal cues to sequence steps with RW. Pt able to follow commands related to ADL participation however pt needs MOD A for thoroughness. Pt continues to present with impaired balance, baseline cognitive deficits, and decreased activity tolerance impacting pts ability to complete BADLs independently. Pt would continue to benefit from skilled occupational therapy while admitted and after d/c to address the below listed limitations in order to improve overall functional mobility and facilitate independence with BADL participation. DC plan remains appropriate, will follow acutely per POC.     Follow Up Recommendations  SNF;Home health OT;Supervision/Assistance - 24 hour;Other (comment) (SNF vs HH based on assist level at home)    Equipment Recommendations  3 in 1 bedside commode    Recommendations for Other Services      Precautions / Restrictions Precautions Precautions: Fall Precaution Comments: advanced dementia Restrictions Weight Bearing Restrictions: No       Mobility Bed Mobility Overal bed mobility: Needs Assistance Bed Mobility: Supine to Sit;Rolling Rolling: Supervision;Min guard   Supine to sit: HOB elevated;Min assist Sit to supine: Mod assist;HOB  elevated   General bed mobility comments: pt OOb in recliner upon arrival and returned to recliner at end of session    Transfers Overall transfer level: Needs assistance Equipment used: Rolling walker (2 wheeled) Transfers: Sit to/from Stand Sit to Stand: Min assist;+2 safety/equipment;Mod assist         General transfer comment: MIN A +2 for sit<>stand from recliner but MODA +2 to desecend into recliner    Balance Overall balance assessment: Needs assistance Sitting-balance support: Feet supported;No upper extremity supported Sitting balance-Leahy Scale: Good Sitting balance - Comments: Able to sit edge of bed with supervision Postural control: Right lateral lean;Posterior lean Standing balance support: During functional activity;Bilateral upper extremity supported;Single extremity supported Standing balance-Leahy Scale: Poor Standing balance comment: at least one UE supported during ADLS               High Level Balance Comments: Difficulty with backward walking           ADL either performed or assessed with clinical judgement   ADL Overall ADL's : Needs assistance/impaired     Grooming: Brushing hair;Standing;Moderate assistance Grooming Details (indicate cue type and reason): pt able to initiate standing ADLs at sink but requried MOD A for cleanliness to fully brush through hair                 Toilet Transfer: Minimal assistance;Ambulation;RW;+2 for safety/equipment Toilet Transfer Details (indicate cue type and reason): simulated via functional mobility, +2 assist for safety         Functional mobility during ADLs: Minimal assistance;Rolling walker;Cueing for safety;Cueing for sequencing;+2 for safety/equipment General ADL Comments: +2 utilized this session to work on increasing functional mobility however pt continues to present with short shuffled gait and needs  up to MOD A for UB ADLs d/t thoroughness.     Vision       Perception      Praxis      Cognition Arousal/Alertness: Awake/alert Behavior During Therapy: WFL for tasks assessed/performed Overall Cognitive Status: History of cognitive impairments - at baseline                                 General Comments: pt following one step commands with increased time needing tactile cues at times for mobility tasks to initiate. Poor sequencing of movements even with extensive cues and physical assist.        Exercises     Shoulder Instructions       General Comments Pulse ox would not register with pt not in resp distress; other VSS    Pertinent Vitals/ Pain       Pain Assessment: No/denies pain Faces Pain Scale: No hurt  Home Living                                          Prior Functioning/Environment              Frequency  Min 2X/week        Progress Toward Goals  OT Goals(current goals can now be found in the care plan section)  Progress towards OT goals: Progressing toward goals  Acute Rehab OT Goals OT Goal Formulation: Patient unable to participate in goal setting Time For Goal Achievement: 10/16/20 Potential to Achieve Goals: Good  Plan Discharge plan remains appropriate;Frequency remains appropriate    Co-evaluation      Reason for Co-Treatment: For patient/therapist safety PT goals addressed during session: Mobility/safety with mobility        AM-PAC OT "6 Clicks" Daily Activity     Outcome Measure   Help from another person eating meals?: None Help from another person taking care of personal grooming?: A Lot Help from another person toileting, which includes using toliet, bedpan, or urinal?: A Lot Help from another person bathing (including washing, rinsing, drying)?: A Lot Help from another person to put on and taking off regular upper body clothing?: A Lot Help from another person to put on and taking off regular lower body clothing?: A Lot 6 Click Score: 14    End of Session  Equipment Utilized During Treatment: Rolling walker;Gait belt  OT Visit Diagnosis: Unsteadiness on feet (R26.81);Muscle weakness (generalized) (M62.81)   Activity Tolerance Patient tolerated treatment well   Patient Left in chair;with call bell/phone within reach;with chair alarm set   Nurse Communication Mobility status        Time: 3810-1751 OT Time Calculation (min): 13 min  Charges: OT General Charges $OT Visit: 1 Visit OT Treatments $Self Care/Home Management : 8-22 mins  [name]., COTA/L Acute Rehabilitation Services [place] 10/10/2020, 1:48 PM

## 2020-10-10 NOTE — Progress Notes (Signed)
  Speech Language Pathology Treatment: Dysphagia  Patient Details Name: Kristy Cabrera MRN: [id number] DOB: Apr 23, 1951 Today's Date: 10/10/2020 Time: 9678-9381 SLP Time Calculation (min) (ACUTE ONLY): 22 min  Assessment / Plan / Recommendation Clinical Impression  Followed up for pt education and diet tolerance. SLP reinforced findings of MBSS completed yesterday and rationale for diet modification to nectar thick liquids and regular textures. No family or caregiver present during session. Provided diligent oral care followed by ice chips and small amounts of regular water. Pt with mild oral holding and suspected delay in swallow initiation per palpation. No overt s/sx of aspiration noted however sensation was noted to be impaired at times during objective swallow test. Assessed pt with regular and nectar thick liquid texture snack. Prolonged mastication exhibited with solids with minimal oral residuals. Recommend continue nectar thick liquid and regular diet with water and ice chips in between meals following diligent oral care. SLP to follow up.      HPI HPI: 70 y.o. female with medical history significant for advanced dementia, HTN, meningioma s/p resection, seizure, unspecified left MCA stroke s/p left-sided endarterectomy in 2020, HLD, currently enrolled in home hospice presented with new onset of left facial droop, left-sided weakness and slurred speech.  CT of the head showed acute stroke on right insula and operculum. PTA pt fed herself a regular diet. MBSS 10/09/20 indicated intermittent aspiratin with thin liquids that was inconsistently sensed with diet recommendations of nectar thick and regular textures.      SLP Plan  Continue with current plan of care       Recommendations  Diet recommendations: Regular;Nectar-thick liquid;Other(comment) (water in between meals following diligent oral care) Liquids provided via: Cup;Straw Medication Administration: Whole meds with  puree Supervision: Staff to assist with self feeding;Full supervision/cueing for compensatory strategies Compensations: Slow rate;Small sips/bites Postural Changes and/or Swallow Maneuvers: Seated upright 90 degrees                Oral Care Recommendations: Oral care BID;Oral care prior to ice chip/H20 Follow up Recommendations: Skilled Nursing facility SLP Visit Diagnosis: Dysphagia, oropharyngeal phase (R13.12) Plan: Continue with current plan of care       GO               Hayden Rasmussen MA, CCC-SLP Acute Rehabilitation Services   10/10/2020, 12:15 PM

## 2020-10-10 NOTE — Plan of Care (Signed)
  Problem: Self-Care: Goal: Ability to communicate needs accurately will improve Outcome: Progressing   Problem: Nutrition: Goal: Risk of aspiration will decrease Outcome: Progressing   Problem: Nutrition: Goal: Dietary intake will improve Outcome: Progressing   Problem: Ischemic Stroke/TIA Tissue Perfusion: Goal: Complications of ischemic stroke/TIA will be minimized Outcome: Progressing

## 2020-10-10 NOTE — Progress Notes (Signed)
PROGRESS NOTE    Kristy Cabrera  UTM:[id number] DOB: [birth year] DOA: 10/01/2020 PCP: [name], NP   Brief Narrative:  70 y.o. female with medical history significant of advanced dementia, HTN, meningioma s/p resection, seizure, unspecified left MCA stroke s/p left-sided endarterectomy in 2020, HLD, currently enrolled in home hospice presented with new onset of left facial droop, left-sided weakness and slurred speech.  On presentation code stroke was called.  CT of the head showed acute stroke on right insula and operculum.  Neurology was consulted.  PT recommended SNF but patient was denied therefore TOC working on safe disposition with her husband. Applied for Medicaid.    Assessment & Plan:   Active Problems:   COPD (chronic obstructive pulmonary disease) (HCC)   Acute ischemic stroke (HCC)   Stroke (cerebrum) (HCC)   Malnutrition of moderate degree   Hospice care patient   Acute right-sided stroke, right MCA territory -Continues to have slurred speech and left-sided facial droop. CT of the head was showing acute stroke in right insula and operculum.  MRI of brain showed multiple small acute right MCA territory infarcts.  CTA head showed new proximal right M2 occlusion with distal reconstitution with severe intracranial atherosclerosis and unchanged 50% mid right common carotid artery stenosis and patent left carotid endarterectomy without recurrent stenosis -Aspirin statin Plavix per neurology -PT/OT - SNF denied. TOC working on safe dispo with her husband -Diet as per SLP recommendations  -Echo shows EF of 55 to 60% with grade 1 diastolic dysfunction. -A1c 5.7 -LDL 153 and cholesterol 220   Advanced dementia -Continue Aricept. -Fall precautions.  Delirium precautions.   Seizure disorder -Continue p.o. Keppra   Hyperlipidemia -Continue statin  Dietician consulted to assess adequate nutrition.   PT/OT recommended SNF but patient was denied. TOC working on alternative  options with the family.   DVT prophylaxis: Lovenox Code Status: Full Code Family Communication: Updated Kristy Cabrera periodically  Status is: Inpatient  Remains inpatient appropriate because:Unsafe d/c plan   Dispo: The patient is from: Home              Anticipated d/c is to: TBD              Patient currently is medically stable to d/c. Unsafe discharge planning. TOC aware. Working with the family for safe disposition.    Difficult to place patient No    Body mass index is 24.2 kg/m.   Subjective:  Sitting up in the bed, slightly more confused today. Follows basic commands.   Examination:  Constitutional: Not in acute distress Respiratory: Clear to auscultation bilaterally Cardiovascular: Normal sinus rhythm, no rubs Abdomen: Nontender nondistended good bowel sounds Musculoskeletal: No edema noted Skin: No rashes seen Neurologic: left sided facial droop, slurred speech. Left sided weakness.  Psychiatric: Poorjudgment and insight. Alert and oriented x  2.    Objective: Vitals:   10/09/20 0333 10/09/20 1630 10/09/20 2058 10/10/20 0539  BP: (!) 155/96 (!) 155/74 (!) 150/90 (!) 150/67  Pulse: 66 (!) 56 (!) 57 (!) 56  Resp: 18 16 17 18   Temp: 97.6 F (36.4 C) 97.9 F (36.6 C) 98 F (36.7 C) 98 F (36.7 C)  TempSrc:   Oral Oral  SpO2: 98% 97% 98% 99%  Weight:      Height:        Intake/Output Summary (Last 24 hours) at 10/10/2020 0831 Last data filed at 10/10/2020 0500 Gross per 24 hour  Intake --  Output 450 ml  Net -450 ml  Filed Weights   10/01/20 1338  Weight: 64 kg     Data Reviewed:   CBC: No results for input(s): WBC, NEUTROABS, HGB, HCT, MCV, PLT in the last 168 hours. Basic Metabolic Panel: Recent Labs  Lab 10/09/20 0140  NA 140  K 4.0  CL 110  CO2 24  GLUCOSE 96  BUN 29*  CREATININE 0.78  CALCIUM 9.7  MG 2.1   GFR: Estimated Creatinine Clearance: 56.5 mL/min (by C-G formula based on SCr of 0.78 mg/dL). Liver Function Tests: No  results for input(s): AST, ALT, ALKPHOS, BILITOT, PROT, ALBUMIN in the last 168 hours. No results for input(s): LIPASE, AMYLASE in the last 168 hours. No results for input(s): AMMONIA in the last 168 hours. Coagulation Profile: No results for input(s): INR, PROTIME in the last 168 hours. Cardiac Enzymes: No results for input(s): CKTOTAL, CKMB, CKMBINDEX, TROPONINI in the last 168 hours. BNP (last 3 results) No results for input(s): PROBNP in the last 8760 hours. HbA1C: No results for input(s): HGBA1C in the last 72 hours. CBG: No results for input(s): GLUCAP in the last 168 hours. Lipid Profile: No results for input(s): CHOL, HDL, LDLCALC, TRIG, CHOLHDL, LDLDIRECT in the last 72 hours. Thyroid Function Tests: No results for input(s): TSH, T4TOTAL, FREET4, T3FREE, THYROIDAB in the last 72 hours. Anemia Panel: No results for input(s): VITAMINB12, FOLATE, FERRITIN, TIBC, IRON, RETICCTPCT in the last 72 hours. Sepsis Labs: No results for input(s): PROCALCITON, LATICACIDVEN in the last 168 hours.  Recent Results (from the past 240 hour(s))  SARS CORONAVIRUS 2 (TAT 6-24 HRS) Nasopharyngeal Nasopharyngeal Swab     Status: None   Collection Time: 10/01/20  2:21 PM   Specimen: Nasopharyngeal Swab  Result Value Ref Range Status   SARS Coronavirus 2 NEGATIVE NEGATIVE Final    Comment: (NOTE) SARS-CoV-2 target nucleic acids are NOT DETECTED.  The SARS-CoV-2 RNA is generally detectable in upper and lower respiratory specimens during the acute phase of infection. Negative results do not preclude SARS-CoV-2 infection, do not rule out co-infections with other pathogens, and should not be used as the sole basis for treatment or other patient management decisions. Negative results must be combined with clinical observations, patient history, and epidemiological information. The expected result is Negative.  Fact Sheet for Patients: SugarRoll.be  Fact Sheet for  Healthcare Providers: https://www.woods-mathews.com/  This test is not yet approved or cleared by the [place] FDA and  has been authorized for detection and/or diagnosis of SARS-CoV-2 by FDA under an Emergency Use Authorization (EUA). This EUA will remain  in effect (meaning this test can be used) for the duration of the COVID-19 declaration under Se ction 564(b)(1) of the Act, 21 U.S.C. section 360bbb-3(b)(1), unless the authorization is terminated or revoked sooner.  Performed at [facility], [place] [address], [place], [place] [address]          Radiology Studies: DG Swallowing Func-Speech Pathology  Result Date: 10/09/2020 Objective Swallowing Evaluation: Type of Study: MBS-Modified Barium Swallow Study  Patient Details Name: Kristy Cabrera MRN: [id number] Date of Birth: 02/06/1951 Today's Date: 10/09/2020 Time: SLP Start Time (ACUTE ONLY): 1248 -SLP Stop Time (ACUTE ONLY): 1259 SLP Time Calculation (min) (ACUTE ONLY): 11.65 min Past Medical History: Past Medical History: Diagnosis Date  Brain tumor (benign) ([place])   Dementia ([place])   Episodic mood disorder (HCC)   Hyperlipidemia   Hypertension   Insomnia   Osteopenia   Weakness of both legs  Past Surgical History: Past Surgical History: Procedure  Laterality Date  Brain tumor removal    ENDARTERECTOMY Left 10/28/2018  Procedure: Left Carotid ENDARTERECTOMY;  Surgeon: [name], MD;  Location: [facility] OR;  Service: Vascular;  Laterality: Left;  [place] HPI: 70 y.o. female with medical history significant for advanced dementia, HTN, meningioma s/p resection, seizure, unspecified left MCA stroke s/p left-sided endarterectomy in 2020, HLD, currently enrolled in home hospice presented with new onset of left facial droop, left-sided weakness and slurred speech.  CT of the head showed acute stroke on right insula and operculum. PTA pt fed herself a regular diet. Assessment / Plan / Recommendation CHL IP  CLINICAL IMPRESSIONS 10/09/2020 Clinical Impression Pt presented with oropharyngeal dysphagia characterized by impaired posterior bolus propulsion, oral holding, and impaired timing of the swallow. She demonstrated difficulty with A-P transport and inconsistent sensed aspiration (PAS 7) of thin liquids. Pt was unable to demonstrate compensatory strategies due to her difficulty following commands. Despite repeated attempts and use of various liquids and puree, pt was unable to demonstrate A-P transport of the barium tablet and ultimately masticated it. A regular texture diet with nectar thick liquids is recommended at this time. Pt's husband was contacted via phone and educated regarding the results of the study and recommendations. He verbalized understanding and agreement. SLP will follow for dysphagia treatment. SLP Visit Diagnosis Dysphagia, oropharyngeal phase (R13.12) Attention and concentration deficit following -- Frontal lobe and executive function deficit following -- Impact on safety and function Mild aspiration risk;Moderate aspiration risk   CHL IP TREATMENT RECOMMENDATION 10/09/2020 Treatment Recommendations Therapy as outlined in treatment plan below   Prognosis 10/09/2020 Prognosis for Safe Diet Advancement Good Barriers to Reach Goals Cognitive deficits Barriers/Prognosis Comment -- CHL IP DIET RECOMMENDATION 10/09/2020 SLP Diet Recommendations Regular solids;Nectar thick liquid Liquid Administration via Cup;Straw Medication Administration Crushed with puree Compensations Slow rate;Small sips/bites Postural Changes Seated upright at 90 degrees   CHL IP OTHER RECOMMENDATIONS 10/09/2020 Recommended Consults -- Oral Care Recommendations Oral care BID Other Recommendations --   CHL IP FOLLOW UP RECOMMENDATIONS 10/09/2020 Follow up Recommendations Skilled Nursing facility   [facility] IP FREQUENCY AND DURATION 10/09/2020 Speech Therapy Frequency (ACUTE ONLY) min 2x/week Treatment Duration 2 weeks      CHL IP ORAL PHASE  10/09/2020 Oral Phase Impaired Oral - Pudding Teaspoon -- Oral - Pudding Cup -- Oral - Honey Teaspoon -- Oral - Honey Cup -- Oral - Nectar Teaspoon -- Oral - Nectar Cup -- Oral - Nectar Straw Reduced posterior propulsion;Holding of bolus Oral - Thin Teaspoon -- Oral - Thin Cup Delayed oral transit;Holding of bolus Oral - Thin Straw Delayed oral transit;Holding of bolus;Reduced posterior propulsion Oral - Puree Delayed oral transit;Holding of bolus;Reduced posterior propulsion Oral - Mech Soft -- Oral - Regular Delayed oral transit;Holding of bolus Oral - Multi-Consistency -- Oral - Pill Delayed oral transit;Holding of bolus;Reduced posterior propulsion Oral Phase - Comment --  CHL IP PHARYNGEAL PHASE 10/09/2020 Pharyngeal Phase Impaired Pharyngeal- Pudding Teaspoon -- Pharyngeal -- Pharyngeal- Pudding Cup -- Pharyngeal -- Pharyngeal- Honey Teaspoon -- Pharyngeal -- Pharyngeal- Honey Cup -- Pharyngeal -- Pharyngeal- Nectar Teaspoon -- Pharyngeal -- Pharyngeal- Nectar Cup -- Pharyngeal -- Pharyngeal- Nectar Straw -- Pharyngeal -- Pharyngeal- Thin Teaspoon -- Pharyngeal -- Pharyngeal- Thin Cup Penetration/Aspiration during swallow;Trace aspiration;Moderate aspiration;Delayed swallow initiation-vallecula;Delayed swallow initiation-pyriform sinuses Pharyngeal Material enters airway, passes BELOW cords and not ejected out despite cough attempt by patient Pharyngeal- Thin Straw Penetration/Aspiration during swallow;Trace aspiration;Moderate aspiration;Delayed swallow initiation-vallecula;Delayed swallow initiation-pyriform sinuses Pharyngeal Material  enters airway, passes BELOW cords and not ejected out despite cough attempt by patient Pharyngeal- Puree Delayed swallow initiation-vallecula Pharyngeal -- Pharyngeal- Mechanical Soft -- Pharyngeal -- Pharyngeal- Regular Delayed swallow initiation-vallecula Pharyngeal -- Pharyngeal- Multi-consistency -- Pharyngeal -- Pharyngeal- Pill Delayed swallow initiation-vallecula  Pharyngeal -- Pharyngeal Comment --  CHL IP CERVICAL ESOPHAGEAL PHASE 10/09/2020 Cervical Esophageal Phase WFL Pudding Teaspoon -- Pudding Cup -- Honey Teaspoon -- Honey Cup -- Nectar Teaspoon -- Nectar Cup -- Nectar Straw -- Thin Teaspoon -- Thin Cup -- Thin Straw -- Puree -- Mechanical Soft -- Regular -- Multi-consistency -- Pill -- Cervical Esophageal Comment -- Shanika I. Hardin Negus, [place], [place] Office number [phone] Pager [phone] Horton Marshall 10/09/2020, 2:51 PM                   Scheduled Meds:  aspirin EC  81 mg Oral Daily   atorvastatin  80 mg Oral q1800   clopidogrel  75 mg Oral Daily   donepezil  5 mg Oral QHS   enoxaparin (LOVENOX) injection  40 mg Subcutaneous Q24H   feeding supplement  237 mL Oral TID BM   levETIRAcetam  750 mg Oral BID   melatonin  3 mg Oral QHS   multivitamin with minerals  1 tablet Oral Daily   sodium chloride flush  3 mL Intravenous Once   Continuous Infusions:    LOS: 9 days   Time spent= 35 mins    Ankit Arsenio Loader, MD [facility]  If 7PM-7AM, please contact night-coverage  10/10/2020, 8:31 AM

## 2020-10-10 NOTE — Progress Notes (Signed)
Nutrition Follow-up  DOCUMENTATION CODES:   Non-severe (moderate) malnutrition in context of chronic illness  INTERVENTION:  -d/c Ensure (inappropriate consistency) -Vital Cuisine Shake po BID, each supplement provides 520 kcal and 22 grams of protein -Mighty Shake po daily, each supplement provides 330 kcal and 9 grams of protein -Continue Magic cup TID with meals, each supplement provides 290 kcal and 9 grams of protein -Continue MVI with minerals daily  NUTRITION DIAGNOSIS:   Moderate Malnutrition related to chronic illness (advanced dementia; s/p R MCA in 2020, declined since) as evidenced by moderate muscle depletion,mild muscle depletion,mild fat depletion. - ongoing  GOAL:   Patient will meet greater than or equal to 90% of their needs - progressing  MONITOR:   PO intake,Supplement acceptance  REASON FOR ASSESSMENT:   Consult Assessment of nutrition requirement/status  ASSESSMENT:   70 yo female with a PMH of advanced dementia, HTN, meningioma s/p resection, seizure, left MCA stroke s/p L-sided endarterectomy in 2020, and HLD who presents with acute R-sided stroke.  3/14 dysphagia 1 diet with thins ordered 3/15 diet advanced to regular with thin liquids 3/21 diet downgraded to regular with nectar thick liquids   Pt pending d/c; per MD, SNF denied.   Pt has been doing well with meals since last RD assessment. 75-100% meal completion x 6 recorded meals. Pt with orders for Ensure TID which she consumed intermittently; however, this supplement is a thin liquid, so will d/c and order a pre-thickened nectar thick supplement for pt instead. Recommend MVI and Magic Cup be continued.   UOP: 47ml x24 hours  Labs and medications reviewed.   Diet Order:   Diet Order            Diet regular Room service appropriate? No; Fluid consistency: Nectar Thick  Diet effective now                 EDUCATION NEEDS:   No education needs have been identified at this  time  Skin:  Skin Assessment: Reviewed RN Assessment  Last BM:  3/19  Height:   Ht Readings from Last 1 Encounters:  10/01/20 5\' 4"  (1.626 m)    Weight:   Wt Readings from Last 1 Encounters:  10/01/20 64 kg    Ideal Body Weight:  54.5 kg  BMI:  Body mass index is 24.2 kg/m.  Estimated Nutritional Needs:   Kcal:  1600-1800  Protein:  80-95 grams  Fluid:  >1.6 L    Larkin Ina, MS, RD, LDN RD pager number and weekend/on-call pager number located in [place].

## 2020-10-10 NOTE — Plan of Care (Signed)
  Problem: Education: Goal: Knowledge of General Education information will improve Description Including pain rating scale, medication(s)/side effects and non-pharmacologic comfort measures Outcome: Progressing   

## 2020-10-11 DIAGNOSIS — E440 Moderate protein-calorie malnutrition: Secondary | ICD-10-CM | POA: Diagnosis not present

## 2020-10-11 NOTE — Progress Notes (Signed)
[facility] PROGRESS NOTE    Progress Note  Kristy Cabrera  PJK:[id number] DOB: Aug 08, 1950 DOA: 10/01/2020 PCP: [name], NP     Brief Narrative:   Kristy Cabrera is an 70 y.o. female past medical his history significant for advanced dementia, status post resection for meningioma, seizure disorder unspecified left MCA stroke status post left sided endarterectomy in 2020 enrolled in hospice comes in with a new onset of left facial droop and left-sided weakness with slurred speech, CT of the head showed an acute CVA at the right insula and operculum. Neurology was consulted and work-up is being completed, physical therapy recommended skilled but was denied therefore TOC working on safe disposition.  Assessment/Plan:   Acute right-sided stroke/right MCA territory: CTA showed a new CVA, MRI of the brain showed multiple small right acute MCA territory infarcts. CTA showed new proximal right M2 occlusion, and severe intracranial atherosclerosis there is 50% mid right common carotid artery stenosis patent left carotid endarterectomy without recurrent stenosis. Neurology was consulted recommended to continue aspirin, statin and Plavix. A1c of 5.7, LDL of 153. Patient was denied skilled TOC working safe disposition. Speech has evaluated the patient the recommended regular nectar thick liquid.  Advanced dementia: Continue Aricept.  Seizure disorder: Continue oral Keppra.  Hyperlipidemia: Continue statins.  DVT prophylaxis: lovexno Family Communication:none Status is: Inpatient  Remains inpatient appropriate because:Hemodynamically unstable   Dispo: The patient is from: Home              Anticipated d/c is to: SNF              Patient currently is medically stable to d/c.   Difficult to place patient Yes        Code Status:     Code Status Orders  (From admission, onward)         Start     Ordered   10/01/20 1406  Do not attempt resuscitation (DNR)   Continuous       Question Answer Comment  In the event of cardiac or respiratory ARREST Do not call a "code blue"   In the event of cardiac or respiratory ARREST Do not perform Intubation, CPR, defibrillation or ACLS   In the event of cardiac or respiratory ARREST Use medication by any route, position, wound care, and other measures to relive pain and suffering. May use oxygen, suction and manual treatment of airway obstruction as needed for comfort.      10/01/20 1407        Code Status History    Date Active Date Inactive Code Status Order ID Comments User Context   10/30/2018 1854 11/02/2018 1935 Full Code [id number]  Sid Falcon, MD Inpatient   10/24/2018 1450 10/29/2018 1951 Full Code [id number]  [name], MD Inpatient   07/04/2016 1621 07/06/2016 1807 Full Code [id number]  [name], MD Inpatient   Advance Care Planning Activity    Advance Directive Documentation   Flowsheet Row Most Recent Value  Type of Advance Directive Out of facility DNR (pink MOST or yellow form)  Pre-existing out of facility DNR order (yellow form or pink MOST form) -  "MOST" Form in Place? -        IV Access:    Peripheral IV   Procedures and diagnostic studies:   DG Swallowing Func-Speech Pathology  Result Date: 10/09/2020 Objective Swallowing Evaluation: Type of Study: MBS-Modified Barium Swallow Study  Patient Details Name: Kristy Cabrera MRN: [id number] Date of Birth: May 03, 1951 Today's Date:  10/09/2020 Time: SLP Start Time (ACUTE ONLY): 1248 -SLP Stop Time (ACUTE ONLY): 1259 SLP Time Calculation (min) (ACUTE ONLY): 11.65 min Past Medical History: Past Medical History: Diagnosis Date . Brain tumor (benign) ([place])  . Dementia ([place])  . Episodic mood disorder ([place])  . Hyperlipidemia  . Hypertension  . Insomnia  . Osteopenia  . Weakness of both legs  Past Surgical History: Past Surgical History: Procedure Laterality Date . Brain tumor removal   . ENDARTERECTOMY Left 10/28/2018  Procedure: Left  Carotid ENDARTERECTOMY;  Surgeon: [name], MD;  Location: [facility];  Service: Vascular;  Laterality: Left; Marland Kitchen MELANOMA EXCISION  1987  BACK HPI: 70 y.o. female with medical history significant for advanced dementia, HTN, meningioma s/p resection, seizure, unspecified left MCA stroke s/p left-sided endarterectomy in 2020, HLD, currently enrolled in home hospice presented with new onset of left facial droop, left-sided weakness and slurred speech.  CT of the head showed acute stroke on right insula and operculum. PTA pt fed herself a regular diet. Assessment / Plan / Recommendation CHL IP CLINICAL IMPRESSIONS 10/09/2020 Clinical Impression Pt presented with oropharyngeal dysphagia characterized by impaired posterior bolus propulsion, oral holding, and impaired timing of the swallow. She demonstrated difficulty with A-P transport and inconsistent sensed aspiration (PAS 7) of thin liquids. Pt was unable to demonstrate compensatory strategies due to her difficulty following commands. Despite repeated attempts and use of various liquids and puree, pt was unable to demonstrate A-P transport of the barium tablet and ultimately masticated it. A regular texture diet with nectar thick liquids is recommended at this time. Pt's husband was contacted via phone and educated regarding the results of the study and recommendations. He verbalized understanding and agreement. SLP will follow for dysphagia treatment. SLP Visit Diagnosis Dysphagia, oropharyngeal phase (R13.12) Attention and concentration deficit following -- Frontal lobe and executive function deficit following -- Impact on safety and function Mild aspiration risk;Moderate aspiration risk   CHL IP TREATMENT RECOMMENDATION 10/09/2020 Treatment Recommendations Therapy as outlined in treatment plan below   Prognosis 10/09/2020 Prognosis for Safe Diet Advancement Good Barriers to Reach Goals Cognitive deficits Barriers/Prognosis Comment -- CHL IP DIET RECOMMENDATION 10/09/2020  SLP Diet Recommendations Regular solids;Nectar thick liquid Liquid Administration via Cup;Straw Medication Administration Crushed with puree Compensations Slow rate;Small sips/bites Postural Changes Seated upright at 90 degrees   CHL IP OTHER RECOMMENDATIONS 10/09/2020 Recommended Consults -- Oral Care Recommendations Oral care BID Other Recommendations --   CHL IP FOLLOW UP RECOMMENDATIONS 10/09/2020 Follow up Recommendations Skilled Nursing facility   [facility] IP FREQUENCY AND DURATION 10/09/2020 Speech Therapy Frequency (ACUTE ONLY) min 2x/week Treatment Duration 2 weeks      CHL IP ORAL PHASE 10/09/2020 Oral Phase Impaired Oral - Pudding Teaspoon -- Oral - Pudding Cup -- Oral - Honey Teaspoon -- Oral - Honey Cup -- Oral - Nectar Teaspoon -- Oral - Nectar Cup -- Oral - Nectar Straw Reduced posterior propulsion;Holding of bolus Oral - Thin Teaspoon -- Oral - Thin Cup Delayed oral transit;Holding of bolus Oral - Thin Straw Delayed oral transit;Holding of bolus;Reduced posterior propulsion Oral - Puree Delayed oral transit;Holding of bolus;Reduced posterior propulsion Oral - Mech Soft -- Oral - Regular Delayed oral transit;Holding of bolus Oral - Multi-Consistency -- Oral - Pill Delayed oral transit;Holding of bolus;Reduced posterior propulsion Oral Phase - Comment --  CHL IP PHARYNGEAL PHASE 10/09/2020 Pharyngeal Phase Impaired Pharyngeal- Pudding Teaspoon -- Pharyngeal -- Pharyngeal- Pudding Cup -- Pharyngeal -- Pharyngeal- Honey Teaspoon -- Pharyngeal -- Pharyngeal- Honey Cup --  Pharyngeal -- Pharyngeal- Nectar Teaspoon -- Pharyngeal -- Pharyngeal- Nectar Cup -- Pharyngeal -- Pharyngeal- Nectar Straw -- Pharyngeal -- Pharyngeal- Thin Teaspoon -- Pharyngeal -- Pharyngeal- Thin Cup Penetration/Aspiration during swallow;Trace aspiration;Moderate aspiration;Delayed swallow initiation-vallecula;Delayed swallow initiation-pyriform sinuses Pharyngeal Material enters airway, passes BELOW cords and not ejected out despite cough  attempt by patient Pharyngeal- Thin Straw Penetration/Aspiration during swallow;Trace aspiration;Moderate aspiration;Delayed swallow initiation-vallecula;Delayed swallow initiation-pyriform sinuses Pharyngeal Material enters airway, passes BELOW cords and not ejected out despite cough attempt by patient Pharyngeal- Puree Delayed swallow initiation-vallecula Pharyngeal -- Pharyngeal- Mechanical Soft -- Pharyngeal -- Pharyngeal- Regular Delayed swallow initiation-vallecula Pharyngeal -- Pharyngeal- Multi-consistency -- Pharyngeal -- Pharyngeal- Pill Delayed swallow initiation-vallecula Pharyngeal -- Pharyngeal Comment --  CHL IP CERVICAL ESOPHAGEAL PHASE 10/09/2020 Cervical Esophageal Phase WFL Pudding Teaspoon -- Pudding Cup -- Honey Teaspoon -- Honey Cup -- Nectar Teaspoon -- Nectar Cup -- Nectar Straw -- Thin Teaspoon -- Thin Cup -- Thin Straw -- Puree -- Mechanical Soft -- Regular -- Multi-consistency -- Pill -- Cervical Esophageal Comment -- Shanika I. Hardin Negus, [place], [place] Office number [phone] Pager [place] 10/09/2020, 2:51 PM                Medical Consultants:    None.   Subjective:    Arlayne Steadham no new complaints.  Objective:    Vitals:   10/10/20 0539 10/10/20 1224 10/10/20 2038 10/11/20 0433  BP: (!) 150/67 107/73 (!) 155/92 (!) 155/101  Pulse: (!) 56 83 68 68  Resp: 18 18 19 18   Temp: 98 F (36.7 C) 97.7 F (36.5 C) 98 F (36.7 C) 98.2 F (36.8 C)  TempSrc: Oral     SpO2: 99% 97% 98% 100%  Weight:      Height:       SpO2: 100 %  No intake or output data in the 24 hours ending 10/11/20 0741 Filed Weights   10/01/20 1338  Weight: 64 kg    Exam: General exam: In no acute distress. Respiratory system: Good air movement and clear to auscultation. Cardiovascular system: S1 & S2 heard, RRR. No JVD.  Gastrointestinal system: Abdomen is nondistended, soft and nontender.  Extremities: No pedal edema. Skin:  No rashes, lesions or ulcers   Data Reviewed:    Labs: Basic Metabolic Panel: Recent Labs  Lab 10/09/20 0140  NA 140  K 4.0  CL 110  CO2 24  GLUCOSE 96  BUN 29*  CREATININE 0.78  CALCIUM 9.7  MG 2.1   GFR Estimated Creatinine Clearance: 56.5 mL/min (by C-G formula based on SCr of 0.78 mg/dL). Liver Function Tests: No results for input(s): AST, ALT, ALKPHOS, BILITOT, PROT, ALBUMIN in the last 168 hours. No results for input(s): LIPASE, AMYLASE in the last 168 hours. No results for input(s): AMMONIA in the last 168 hours. Coagulation profile No results for input(s): INR, PROTIME in the last 168 hours. COVID-19 Labs  No results for input(s): DDIMER, FERRITIN, LDH, CRP in the last 72 hours.  Lab Results  Component Value Date   [place] NEGATIVE 10/01/2020    CBC: No results for input(s): WBC, NEUTROABS, HGB, HCT, MCV, PLT in the last 168 hours. Cardiac Enzymes: No results for input(s): CKTOTAL, CKMB, CKMBINDEX, TROPONINI in the last 168 hours. BNP (last 3 results) No results for input(s): PROBNP in the last 8760 hours. CBG: No results for input(s): GLUCAP in the last 168 hours. D-Dimer: No results for input(s): DDIMER in the last 72 hours. Hgb A1c: No results for  input(s): HGBA1C in the last 72 hours. Lipid Profile: No results for input(s): CHOL, HDL, LDLCALC, TRIG, CHOLHDL, LDLDIRECT in the last 72 hours. Thyroid function studies: No results for input(s): TSH, T4TOTAL, T3FREE, THYROIDAB in the last 72 hours.  Invalid input(s): FREET3 Anemia work up: No results for input(s): VITAMINB12, FOLATE, FERRITIN, TIBC, IRON, RETICCTPCT in the last 72 hours. Sepsis Labs: No results for input(s): PROCALCITON, WBC, LATICACIDVEN in the last 168 hours. Microbiology Recent Results (from the past 240 hour(s))  SARS CORONAVIRUS 2 (TAT 6-24 HRS) Nasopharyngeal Nasopharyngeal Swab     Status: None   Collection Time: 10/01/20  2:21 PM   Specimen: Nasopharyngeal Swab   Result Value Ref Range Status   SARS Coronavirus 2 NEGATIVE NEGATIVE Final    Comment: (NOTE) SARS-CoV-2 target nucleic acids are NOT DETECTED.  The SARS-CoV-2 RNA is generally detectable in upper and lower respiratory specimens during the acute phase of infection. Negative results do not preclude SARS-CoV-2 infection, do not rule out co-infections with other pathogens, and should not be used as the sole basis for treatment or other patient management decisions. Negative results must be combined with clinical observations, patient history, and epidemiological information. The expected result is Negative.  Fact Sheet for Patients: SugarRoll.be  Fact Sheet for Healthcare Providers: https://www.woods-mathews.com/  This test is not yet approved or cleared by the [place] FDA and  has been authorized for detection and/or diagnosis of SARS-CoV-2 by FDA under an Emergency Use Authorization (EUA). This EUA will remain  in effect (meaning this test can be used) for the duration of the COVID-19 declaration under Se ction 564(b)(1) of the Act, 21 U.S.C. section 360bbb-3(b)(1), unless the authorization is terminated or revoked sooner.  Performed at [facility], [place] [address], [place], [place] [address]      Medications:   . aspirin EC  81 mg Oral Daily  . atorvastatin  80 mg Oral q1800  . clopidogrel  75 mg Oral Daily  . donepezil  5 mg Oral QHS  . enoxaparin (LOVENOX) injection  40 mg Subcutaneous Q24H  . feeding supplement  237 mL Oral TID BM  . levETIRAcetam  750 mg Oral BID  . melatonin  3 mg Oral QHS  . multivitamin with minerals  1 tablet Oral Daily  . sodium chloride flush  3 mL Intravenous Once   Continuous Infusions:    LOS: 10 days   [name]  [facility]  10/11/2020, 7:41 AM

## 2020-10-11 NOTE — Progress Notes (Signed)
  Speech Language Pathology Treatment: Cognitive-Linquistic (Dysarthria)  Patient Details Name: Kristy Cabrera MRN: [id number] DOB: [birth year] Today's Date: 10/11/2020 Time: 2542-7062 SLP Time Calculation (min) (ACUTE ONLY): 17.02 min  Assessment / Plan / Recommendation Clinical Impression  Pt was seen for dysarthria treatment. She was alert and cooperative during the session without c/o pain. Pt's nurse, Claiborne Billings, reported that the pt has been tolerating the diet and water protocol between meals well. Pt demonstrated no generalization of skills from prior sessions, but some improvement in performance was noted today. She was re-educated today on compensatory strategies for speech intelligibility. Considering pt's baseline cognitive impairment, today's session focused only on increasing vocal intensity and breath support. Use of these strategies was targeted with photos of objects and single-action photos to further reduce cognitive burden. She demonstrated 70% accuracy with use of vocal intensity at the word level increasing to 80% accuracy with self-correction and to 100% with cues. At the phrase level she demonstrated 50% accuracy increasing to 100% with cues for breath support and vocal intensity. Her overall performance was improved compared to prior sessions and she was better able to self-monitor. SLP will continue to follow pt.    HPI HPI: 70 y.o. female with medical history significant for advanced dementia, HTN, meningioma s/p resection, seizure, unspecified left MCA stroke s/p left-sided endarterectomy in 2020, HLD, currently enrolled in home hospice presented with new onset of left facial droop, left-sided weakness and slurred speech.  CT of the head showed acute stroke on right insula and operculum. PTA pt fed herself a regular diet. MBSS 10/09/20 indicated intermittent aspiratin with thin liquids that was inconsistently sensed with diet recommendations of nectar thick and regular textures.       SLP Plan  Continue with current plan of care       Recommendations  Diet recommendations: Regular;Nectar-thick liquid (Thin liquids and ice chips  between meals following oral care) Liquids provided via: Cup;Straw Medication Administration: Whole meds with puree Supervision: Staff to assist with self feeding;Full supervision/cueing for compensatory strategies Compensations: Slow rate;Small sips/bites Postural Changes and/or Swallow Maneuvers: Seated upright 90 degrees                Oral Care Recommendations: Oral care BID;Oral care prior to ice chip/H20 Follow up Recommendations: Skilled Nursing facility SLP Visit Diagnosis: Dysphagia, oropharyngeal phase (R13.12);Dysarthria and anarthria (R47.1) Plan: Continue with current plan of care       Shanika I. Hardin Negus, [place], [place] Office number [phone] Pager [place] 10/11/2020, 1:13 PM

## 2020-10-11 NOTE — Plan of Care (Signed)
  Problem: Self-Care: Goal: Ability to participate in self-care as condition permits will improve Outcome: Progressing   Problem: Self-Care: Goal: Ability to communicate needs accurately will improve Outcome: Progressing   Problem: Nutrition: Goal: Risk of aspiration will decrease Outcome: Progressing

## 2020-10-12 NOTE — Plan of Care (Signed)
  Problem: Clinical Measurements: Goal: Respiratory complications will improve Outcome: Progressing   Problem: Clinical Measurements: Goal: Cardiovascular complication will be avoided Outcome: Progressing   Problem: Coping: Goal: Level of anxiety will decrease Outcome: Progressing   Problem: Pain Managment: Goal: General experience of comfort will improve Outcome: Progressing   Problem: Self-Care: Goal: Ability to communicate needs accurately will improve Outcome: Progressing

## 2020-10-12 NOTE — Progress Notes (Signed)
Occupational Therapy Treatment Patient Details Name: Kristy Cabrera MRN: [id number] DOB: January 03, 1951 Today's Date: 10/12/2020    History of present illness 70 y.o. female presented 3/13 with new onset of left facial droop, left-sided weakness and slurred speech.  CT of the head showed acute stroke on right insula and operculum PMH:  advanced dementia, HTN, meningioma s/p resection, seizure, unspecified left MCA stroke s/p left-sided endarterectomy in 2020, HLD, currently enrolled in home hospice   OT comments  Pt progressing. Pt continues to be limited by decreased initiation of movements, decreased strength and decreased ability to follow all commands. Pt minA for mobility with hands on assist required and frequent cues. Pt set-upA to maxA for ADL and unaware of BM during session. Pt saying a few intelligible things today. "walk to bed" and "Rosie." Pt unable to properly care for self and spouse is unable as he is w/c bound. Pt would benefit from continued OT skilled services. OT following acutely.    Follow Up Recommendations  SNF;Supervision/Assistance - 24 hour    Equipment Recommendations  3 in 1 bedside commode    Recommendations for Other Services      Precautions / Restrictions Precautions Precautions: Fall Precaution Comments: advanced dementia       Mobility Bed Mobility Overal bed mobility: Needs Assistance Bed Mobility: Supine to Sit     Supine to sit: Min assist;HOB elevated     General bed mobility comments: in recliner pre and post session    Transfers Overall transfer level: Needs assistance   Transfers: Sit to/from Stand Sit to Stand: Min assist         General transfer comment: cues for hand placement and to initiate    Balance Overall balance assessment: Needs assistance   Sitting balance-Leahy Scale: Fair Sitting balance - Comments: in recliner at sink   Standing balance support: Bilateral upper extremity supported Standing balance-Leahy  Scale: Poor Standing balance comment: standing at sink and standing in front of BSC for toilet hygiene                           ADL either performed or assessed with clinical judgement   ADL Overall ADL's : Needs assistance/impaired Eating/Feeding: Set up;Sitting Eating/Feeding Details (indicate cue type and reason): taking a sip of thickened water. Grooming: Wash/dry hands;Wash/dry face;Oral care;Sitting Grooming Details (indicate cue type and reason): pt difficult to motivate to perform a task in standing so pt stood as chair put down behind her and then pt sat down for tasks.                 Toilet Transfer: Minimal assistance;Ambulation;RW Toilet Transfer Details (indicate cue type and reason): sat on BSC (unaware of BM)         Functional mobility during ADLs: Minimal assistance;Rolling walker;Cueing for safety;Cueing for sequencing;+2 for safety/equipment General ADL Comments: Pt continues to be limited by decreased initiation of movements, decreased strength and decreased ability to follow all commands. Pt unable to properly care for self and spouse is unable as he is w/c bound.     Vision   Vision Assessment?: No apparent visual deficits   Perception     Praxis      Cognition Arousal/Alertness: Awake/alert Behavior During Therapy: WFL for tasks assessed/performed Overall Cognitive Status: No family/caregiver present to determine baseline cognitive functioning  General Comments: Pt following 1 step commands, but requires cues to assist with multi step or initiation of any movement, especially walking.        Exercises General Exercises - Lower Extremity Long Arc Quad: AROM;Both;10 reps;Seated Hip ABduction/ADduction: AAROM;Both;10 reps;Seated Hip Flexion/Marching: AROM;Both;10 reps;Seated Other Exercises Other Exercises: sit to stands x3, standing tolerance   Shoulder Instructions       General  Comments Pt stating a few things "walk to bed" and "rosie" throughout tasks.    Pertinent Vitals/ Pain       Pain Assessment: No/denies pain  Home Living                                          Prior Functioning/Environment              Frequency  Min 2X/week        Progress Toward Goals  OT Goals(current goals can now be found in the care plan section)  Progress towards OT goals: Progressing toward goals  Acute Rehab OT Goals Patient Stated Goal: to get up OT Goal Formulation: Patient unable to participate in goal setting Time For Goal Achievement: 10/16/20 Potential to Achieve Goals: Good ADL Goals Pt Will Perform Eating: with set-up;sitting Pt Will Perform Grooming: with set-up;sitting Pt Will Transfer to Toilet: with supervision;bedside commode;ambulating Additional ADL Goal #1: Pt will follow 1 step commands to assess ability to follow ADL and mobility.  Plan Discharge plan needs to be updated;Frequency remains appropriate    Co-evaluation                 AM-PAC OT "6 Clicks" Daily Activity     Outcome Measure   Help from another person eating meals?: None Help from another person taking care of personal grooming?: A Lot Help from another person toileting, which includes using toliet, bedpan, or urinal?: A Lot Help from another person bathing (including washing, rinsing, drying)?: A Lot Help from another person to put on and taking off regular upper body clothing?: A Lot Help from another person to put on and taking off regular lower body clothing?: A Lot 6 Click Score: 14    End of Session Equipment Utilized During Treatment: Rolling walker;Gait belt  OT Visit Diagnosis: Unsteadiness on feet (R26.81);Muscle weakness (generalized) (M62.81)   Activity Tolerance Patient tolerated treatment well   Patient Left in chair;with call bell/phone within reach;with chair alarm set   Nurse Communication Mobility status        Time:  3299-2426 OT Time Calculation (min): 26 min  Charges: OT General Charges $OT Visit: 1 Visit OT Treatments $Self Care/Home Management : 8-22 mins $Therapeutic Activity: 8-22 mins  Jefferey Pica, OTR/L Acute Rehabilitation Services Pager: [phone] Office: [phone]    ALLISON C 10/12/2020, 3:12 PM

## 2020-10-12 NOTE — Progress Notes (Signed)
Physical Therapy Treatment Patient Details Name: Kristy Cabrera MRN: [id number] DOB: 1951/05/24 Today's Date: 10/12/2020    History of Present Illness 70 y.o. female presented 3/13 with new onset of left facial droop, left-sided weakness and slurred speech.  CT of the head showed acute stroke on right insula and operculum PMH:  advanced dementia, HTN, meningioma s/p resection, seizure, unspecified left MCA stroke s/p left-sided endarterectomy in 2020, HLD, currently enrolled in home hospice    PT Comments    Pt awake and following commands during session with improved tolerance for gait with assist of RW and initiation for all mobility. Pt following tactile commands for bil LE HEP with assist for hip abduction. Plan remains appropriate as spouse unable to care for pt at home. Will continue to follow.     Follow Up Recommendations  SNF;Supervision/Assistance - 24 hour     Equipment Recommendations  None recommended by PT    Recommendations for Other Services       Precautions / Restrictions Precautions Precautions: Fall Precaution Comments: advanced dementia    Mobility  Bed Mobility Overal bed mobility: Needs Assistance Bed Mobility: Supine to Sit     Supine to sit: Min assist;HOB elevated     General bed mobility comments: min assist to bring legs to EOB and continue momentum for transition to sitting at EOB with max cues. HOB 30 degrees    Transfers Overall transfer level: Needs assistance   Transfers: Sit to/from Stand Sit to Stand: Min assist         General transfer comment: cues and assist to initiate and rise from surface  Ambulation/Gait Ambulation/Gait assistance: Min assist Gait Distance (Feet): 24 Feet Assistive device: Rolling walker (2 wheeled) Gait Pattern/deviations: Shuffle;Trunk flexed   Gait velocity interpretation: <1.8 ft/sec, indicate of risk for recurrent falls General Gait Details: pt with short shuffling gait able to walk to door and  back with RW with assist to advance RW to initiate gait with assist for balance   Stairs             Wheelchair Mobility    Modified Rankin (Stroke Patients Only)       Balance Overall balance assessment: Needs assistance   Sitting balance-Leahy Scale: Fair Sitting balance - Comments: EOb and in chair without UE support   Standing balance support: Bilateral upper extremity supported Standing balance-Leahy Scale: Poor Standing balance comment: bil UE support on RW for gait                            Cognition Arousal/Alertness: Awake/alert Behavior During Therapy: WFL for tasks assessed/performed Overall Cognitive Status: No family/caregiver present to determine baseline cognitive functioning                                 General Comments: pt following one step commands with delay with increased cueing. Pt able to state name and day. When asked place she stated "benjamin moore"      Exercises General Exercises - Lower Extremity Long Arc Quad: AROM;Both;10 reps;Seated Hip ABduction/ADduction: AAROM;Both;10 reps;Seated Hip Flexion/Marching: AROM;Both;10 reps;Seated    General Comments        Pertinent Vitals/Pain Pain Assessment: No/denies pain    Home Living                      Prior Function  PT Goals (current goals can now be found in the care plan section) Acute Rehab PT Goals Time For Goal Achievement: 10/26/20 Potential to Achieve Goals: Fair Progress towards PT goals: Progressing toward goals    Frequency    Min 3X/week      PT Plan Current plan remains appropriate    Co-evaluation              AM-PAC PT "6 Clicks" Mobility   Outcome Measure  Help needed turning from your back to your side while in a flat bed without using bedrails?: A Little Help needed moving from lying on your back to sitting on the side of a flat bed without using bedrails?: A Little Help needed moving to and  from a bed to a chair (including a wheelchair)?: A Little Help needed standing up from a chair using your arms (e.g., wheelchair or bedside chair)?: A Little Help needed to walk in hospital room?: A Little Help needed climbing 3-5 steps with a railing? : Total 6 Click Score: 16    End of Session Equipment Utilized During Treatment: Gait belt Activity Tolerance: Patient tolerated treatment well Patient left: in chair;with call bell/phone within reach;with chair alarm set Nurse Communication: Mobility status PT Visit Diagnosis: Muscle weakness (generalized) (M62.81);Difficulty in walking, not elsewhere classified (R26.2);Other abnormalities of gait and mobility (R26.89)     Time: 6256-3893 PT Time Calculation (min) (ACUTE ONLY): 17 min  Charges:  $Gait Training: 8-22 mins                     Bayard Males, PT Acute Rehabilitation Services Pager: [phone] Office: [place] 10/12/2020, 1:24 PM

## 2020-10-12 NOTE — Progress Notes (Signed)
[facility] PROGRESS NOTE    Progress Note  Kristy Cabrera  NFA:[id number] DOB: April 05, 1951 DOA: 10/01/2020 PCP: [name], NP     Brief Narrative:   Kristy Cabrera is an 70 y.o. female past medical his history significant for advanced dementia, status post resection for meningioma, seizure disorder unspecified left MCA stroke status post left sided endarterectomy in 2020 enrolled in hospice comes in with a new onset of left facial droop and left-sided weakness with slurred speech, CT of the head showed an acute CVA at the right insula and operculum. Neurology was consulted and work-up is being completed, physical therapy recommended skilled but was denied therefore TOC working on safe disposition.  Patient is medically stable to be transferred to skilled nursing facility.   Assessment/Plan:   Acute right-sided stroke/right MCA territory: CTA showed a new CVA, MRI of the brain showed multiple small right acute MCA territory infarcts. CTA showed new proximal right M2 occlusion, and severe intracranial atherosclerosis there is 50% mid right common carotid artery stenosis patent left carotid endarterectomy without recurrent stenosis. Neurology was consulted recommended to continue aspirin, statin and Plavix. A1c of 5.7, LDL of 153. Speech has evaluated the patient the recommended regular nectar thick liquid. Patient was denied to skilled nursing facility awaiting TOC placement. Patient is medically stable to be transferred to skilled nursing slowly.  Advanced dementia: Continue Aricept.  Seizure disorder: Continue oral Keppra.  Hyperlipidemia: Continue statins.  DVT prophylaxis: lovexno Family Communication:none Status is: Inpatient  Remains inpatient appropriate because:Hemodynamically unstable   Dispo: The patient is from: Home              Anticipated d/c is to: SNF              Patient currently is medically stable to d/c.   Difficult to place patient  Yes        Code Status:     Code Status Orders  (From admission, onward)         Start     Ordered   10/01/20 1406  Do not attempt resuscitation (DNR)  Continuous       Question Answer Comment  In the event of cardiac or respiratory ARREST Do not call a "code blue"   In the event of cardiac or respiratory ARREST Do not perform Intubation, CPR, defibrillation or ACLS   In the event of cardiac or respiratory ARREST Use medication by any route, position, wound care, and other measures to relive pain and suffering. May use oxygen, suction and manual treatment of airway obstruction as needed for comfort.      10/01/20 1407        Code Status History    Date Active Date Inactive Code Status Order ID Comments User Context   10/30/2018 1854 11/02/2018 1935 Full Code [id number]  Sid Falcon, MD Inpatient   10/24/2018 1450 10/29/2018 1951 Full Code [id number]  [name], MD Inpatient   07/04/2016 1621 07/06/2016 1807 Full Code [id number]  [name], MD Inpatient   Advance Care Planning Activity    Advance Directive Documentation   Flowsheet Row Most Recent Value  Type of Advance Directive Out of facility DNR (pink MOST or yellow form)  Pre-existing out of facility DNR order (yellow form or pink MOST form) -  "MOST" Form in Place? -        IV Access:    Peripheral IV   Procedures and diagnostic studies:   No results found.   Medical  Consultants:    None.   Subjective:    Anwyn Hussein sleepy sleepy this morning not very talkative  Objective:    Vitals:   10/11/20 0433 10/11/20 1249 10/11/20 2005 10/12/20 0458  BP: (!) 155/101 (!) 153/86 138/60 132/65  Pulse: 68  72 70  Resp: 18 19 18 17   Temp: 98.2 F (36.8 C) 98.5 F (36.9 C) 98.4 F (36.9 C) 98.6 F (37 C)  TempSrc:  Oral Oral Oral  SpO2: 100% 96% 95% 96%  Weight:      Height:       SpO2: 96 %   Intake/Output Summary (Last 24 hours) at 10/12/2020 0740 Last data filed at  10/11/2020 0815 Gross per 24 hour  Intake 240 ml  Output -  Net 240 ml   Filed Weights   10/01/20 1338  Weight: 64 kg    Exam: General exam: In no acute distress. Respiratory system: Good air movement and clear to auscultation. Cardiovascular system: S1 & S2 heard, RRR. No JVD. Gastrointestinal system: Abdomen is nondistended, soft and nontender.  Extremities: No pedal edema. Skin: No rashes, lesions or ulcers   Data Reviewed:    Labs: Basic Metabolic Panel: Recent Labs  Lab 10/09/20 0140  NA 140  K 4.0  CL 110  CO2 24  GLUCOSE 96  BUN 29*  CREATININE 0.78  CALCIUM 9.7  MG 2.1   GFR Estimated Creatinine Clearance: 56.5 mL/min (by C-G formula based on SCr of 0.78 mg/dL). Liver Function Tests: No results for input(s): AST, ALT, ALKPHOS, BILITOT, PROT, ALBUMIN in the last 168 hours. No results for input(s): LIPASE, AMYLASE in the last 168 hours. No results for input(s): AMMONIA in the last 168 hours. Coagulation profile No results for input(s): INR, PROTIME in the last 168 hours. COVID-19 Labs  No results for input(s): DDIMER, FERRITIN, LDH, CRP in the last 72 hours.  Lab Results  Component Value Date   [place] NEGATIVE 10/01/2020    CBC: No results for input(s): WBC, NEUTROABS, HGB, HCT, MCV, PLT in the last 168 hours. Cardiac Enzymes: No results for input(s): CKTOTAL, CKMB, CKMBINDEX, TROPONINI in the last 168 hours. BNP (last 3 results) No results for input(s): PROBNP in the last 8760 hours. CBG: No results for input(s): GLUCAP in the last 168 hours. D-Dimer: No results for input(s): DDIMER in the last 72 hours. Hgb A1c: No results for input(s): HGBA1C in the last 72 hours. Lipid Profile: No results for input(s): CHOL, HDL, LDLCALC, TRIG, CHOLHDL, LDLDIRECT in the last 72 hours. Thyroid function studies: No results for input(s): TSH, T4TOTAL, T3FREE, THYROIDAB in the last 72 hours.  Invalid input(s): FREET3 Anemia work up: No results for  input(s): VITAMINB12, FOLATE, FERRITIN, TIBC, IRON, RETICCTPCT in the last 72 hours. Sepsis Labs: No results for input(s): PROCALCITON, WBC, LATICACIDVEN in the last 168 hours. Microbiology No results found for this or any previous visit (from the past 240 hour(s)).   Medications:   . aspirin EC  81 mg Oral Daily  . atorvastatin  80 mg Oral q1800  . clopidogrel  75 mg Oral Daily  . donepezil  5 mg Oral QHS  . enoxaparin (LOVENOX) injection  40 mg Subcutaneous Q24H  . feeding supplement  237 mL Oral TID BM  . levETIRAcetam  750 mg Oral BID  . melatonin  3 mg Oral QHS  . multivitamin with minerals  1 tablet Oral Daily  . sodium chloride flush  3 mL Intravenous Once   Continuous Infusions:  LOS: 11 days   [name]  [facility]  10/12/2020, 7:40 AM

## 2020-10-13 NOTE — Progress Notes (Signed)
Occupational Therapy Treatment Patient Details Name: Kristy Cabrera MRN: [id number] DOB: January 26, 1951 Today's Date: 10/13/2020    History of present illness 70 y.o. female presented 3/13 with new onset of left facial droop, left-sided weakness and slurred speech.  CT of the head showed acute stroke on right insula and operculum PMH:  advanced dementia, HTN, meningioma s/p resection, seizure, unspecified left MCA stroke s/p left-sided endarterectomy in 2020, HLD, currently enrolled in home hospice   OT comments  Pt seen for self feeding.  She requires min A and moderate multi modal cues to initiate taking bites, sequencing of task, problem solving, and for precautions.  Continue to recommend SNF level rehab.   Follow Up Recommendations  SNF;Supervision/Assistance - 24 hour    Equipment Recommendations  3 in 1 bedside commode    Recommendations for Other Services      Precautions / Restrictions Precautions Precautions: Fall Precaution Comments: advanced dementia       Mobility Bed Mobility                    Transfers                      Balance                                           ADL either performed or assessed with clinical judgement   ADL Overall ADL's : Needs assistance/impaired Eating/Feeding: Minimal assistance;Bed level Eating/Feeding Details (indicate cue type and reason): Pt requires min A and moderate multi modal cues during self feeding to initiate taking bites, sequencing of task and for problem solving. Cues provided for small bites                                         Vision       Perception     Praxis      Cognition Arousal/Alertness: Awake/alert Behavior During Therapy: WFL for tasks assessed/performed Overall Cognitive Status: No family/caregiver present to determine baseline cognitive functioning                                 General Comments: Pt follow one step  commands with multi modal cues during self feeding (familiar) task.  She requires mod cues for initiation, sequencing and problem solving during self feeding        Exercises     Shoulder Instructions       General Comments      Pertinent Vitals/ Pain       Pain Assessment: Faces Faces Pain Scale: No hurt  Home Living                                          Prior Functioning/Environment              Frequency  Min 2X/week        Progress Toward Goals  OT Goals(current goals can now be found in the care plan section)  Progress towards OT goals: Progressing toward goals     Plan Discharge plan needs to be updated;Frequency remains appropriate  Co-evaluation                 AM-PAC OT "6 Clicks" Daily Activity     Outcome Measure   Help from another person eating meals?: A Little Help from another person taking care of personal grooming?: A Lot Help from another person toileting, which includes using toliet, bedpan, or urinal?: A Lot Help from another person bathing (including washing, rinsing, drying)?: A Lot Help from another person to put on and taking off regular upper body clothing?: A Lot Help from another person to put on and taking off regular lower body clothing?: A Lot 6 Click Score: 13    End of Session    OT Visit Diagnosis: Unsteadiness on feet (R26.81);Muscle weakness (generalized) (M62.81);Cognitive communication deficit (R41.841)   Activity Tolerance Patient tolerated treatment well   Patient Left in bed;with call bell/phone within reach;with bed alarm set   Nurse Communication Mobility status        Time: 5188-4166 OT Time Calculation (min): 28 min  Charges: OT General Charges $OT Visit: 1 Visit OT Treatments $Self Care/Home Management : 23-37 mins  Nilsa Nutting OTR/L Acute Rehabilitation Services Pager [phone] Office [phone]    Lucille Passy M 10/13/2020, 2:02 PM

## 2020-10-13 NOTE — Progress Notes (Signed)
[facility] PROGRESS NOTE    Progress Note  Kristy Cabrera  XFG:[id number] DOB: [birth year] DOA: 10/01/2020 PCP: [name], NP     Brief Narrative:   Kristy Cabrera is an 70 y.o. female past medical his history significant for advanced dementia, status post resection for meningioma, seizure disorder unspecified left MCA stroke status post left sided endarterectomy in 2020 enrolled in hospice comes in with a new onset of left facial droop and left-sided weakness with slurred speech, CT of the head showed an acute CVA at the right insula and operculum. Neurology was consulted and work-up is being completed, physical therapy recommended skilled but was denied therefore TOC working on safe disposition.  Patient is medically stable to be transferred to skilled nursing facility.   Assessment/Plan:   Acute right-sided stroke/right MCA territory: Patient is tolerating his diet.   Patient was denied to skilled nursing facility awaiting TOC placement. Patient is medically stable to be transferred to skilled nursing slowly.  Advanced dementia: Continue Aricept.  Seizure disorder: Continue oral Keppra.  Hyperlipidemia: Continue statins.  DVT prophylaxis: lovexno Family Communication:none Status is: Inpatient  Remains inpatient appropriate because:Hemodynamically unstable   Dispo: The patient is from: Home              Anticipated d/c is to: SNF              Patient currently is medically stable to d/c.   Difficult to place patient Yes        Code Status:     Code Status Orders  (From admission, onward)         Start     Ordered   10/01/20 1406  Do not attempt resuscitation (DNR)  Continuous       Question Answer Comment  In the event of cardiac or respiratory ARREST Do not call a "code blue"   In the event of cardiac or respiratory ARREST Do not perform Intubation, CPR, defibrillation or ACLS   In the event of cardiac or respiratory ARREST Use medication  by any route, position, wound care, and other measures to relive pain and suffering. May use oxygen, suction and manual treatment of airway obstruction as needed for comfort.      10/01/20 1407        Code Status History    Date Active Date Inactive Code Status Order ID Comments User Context   10/30/2018 1854 11/02/2018 1935 Full Code [id number]  Sid Falcon, MD Inpatient   10/24/2018 1450 10/29/2018 1951 Full Code [id number]  [name], MD Inpatient   07/04/2016 1621 07/06/2016 1807 Full Code [id number]  [name], MD Inpatient   Advance Care Planning Activity    Advance Directive Documentation   Flowsheet Row Most Recent Value  Type of Advance Directive Out of facility DNR (pink MOST or yellow form)  Pre-existing out of facility DNR order (yellow form or pink MOST form) --  "MOST" Form in Place? --        IV Access:    Peripheral IV   Procedures and diagnostic studies:   No results found.   Medical Consultants:    None.   Subjective:    Jacklyn Elbert sleepy sleepy this morning not very talkative  Objective:    Vitals:   10/12/20 0458 10/12/20 1206 10/12/20 2028 10/13/20 0300  BP: 132/65 (!) 149/72 140/70 (!) 160/84  Pulse: 70  68 72  Resp: 17 19 18 19   Temp: 98.6 F (37 C) 98.5 F (  36.9 C) 98.2 F (36.8 C) 98.3 F (36.8 C)  TempSrc: Oral Oral Oral Oral  SpO2: 96% 97% 98% 95%  Weight:      Height:       SpO2: 95 %   Intake/Output Summary (Last 24 hours) at 10/13/2020 0802 Last data filed at 10/13/2020 0500 Gross per 24 hour  Intake --  Output 600 ml  Net -600 ml   Filed Weights   10/01/20 1338  Weight: 64 kg    Exam: General exam: In no acute distress. Respiratory system: Good air movement and clear to auscultation. Cardiovascular system: S1 & S2 heard, RRR. No JVD. Gastrointestinal system: Abdomen is nondistended, soft and nontender.  Extremities: No pedal edema. Skin: No rashes, lesions or ulcers   Data Reviewed:     Labs: Basic Metabolic Panel: Recent Labs  Lab 10/09/20 0140  NA 140  K 4.0  CL 110  CO2 24  GLUCOSE 96  BUN 29*  CREATININE 0.78  CALCIUM 9.7  MG 2.1   GFR Estimated Creatinine Clearance: 56.5 mL/min (by C-G formula based on SCr of 0.78 mg/dL). Liver Function Tests: No results for input(s): AST, ALT, ALKPHOS, BILITOT, PROT, ALBUMIN in the last 168 hours. No results for input(s): LIPASE, AMYLASE in the last 168 hours. No results for input(s): AMMONIA in the last 168 hours. Coagulation profile No results for input(s): INR, PROTIME in the last 168 hours. COVID-19 Labs  No results for input(s): DDIMER, FERRITIN, LDH, CRP in the last 72 hours.  Lab Results  Component Value Date   [place] NEGATIVE 10/01/2020    CBC: No results for input(s): WBC, NEUTROABS, HGB, HCT, MCV, PLT in the last 168 hours. Cardiac Enzymes: No results for input(s): CKTOTAL, CKMB, CKMBINDEX, TROPONINI in the last 168 hours. BNP (last 3 results) No results for input(s): PROBNP in the last 8760 hours. CBG: No results for input(s): GLUCAP in the last 168 hours. D-Dimer: No results for input(s): DDIMER in the last 72 hours. Hgb A1c: No results for input(s): HGBA1C in the last 72 hours. Lipid Profile: No results for input(s): CHOL, HDL, LDLCALC, TRIG, CHOLHDL, LDLDIRECT in the last 72 hours. Thyroid function studies: No results for input(s): TSH, T4TOTAL, T3FREE, THYROIDAB in the last 72 hours.  Invalid input(s): FREET3 Anemia work up: No results for input(s): VITAMINB12, FOLATE, FERRITIN, TIBC, IRON, RETICCTPCT in the last 72 hours. Sepsis Labs: No results for input(s): PROCALCITON, WBC, LATICACIDVEN in the last 168 hours. Microbiology No results found for this or any previous visit (from the past 240 hour(s)).   Medications:   . aspirin EC  81 mg Oral Daily  . atorvastatin  80 mg Oral q1800  . clopidogrel  75 mg Oral Daily  . donepezil  5 mg Oral QHS  . enoxaparin (LOVENOX)  injection  40 mg Subcutaneous Q24H  . feeding supplement  237 mL Oral TID BM  . levETIRAcetam  750 mg Oral BID  . melatonin  3 mg Oral QHS  . multivitamin with minerals  1 tablet Oral Daily  . sodium chloride flush  3 mL Intravenous Once   Continuous Infusions:    LOS: 12 days   [name]  [facility]  10/13/2020, 8:02 AM

## 2020-10-13 NOTE — Progress Notes (Signed)
   Melania Kirks remains active with [facility]. Up until yesterday we were paying the General inpt. level of care rate. She starting today  Residential will switch to residential level of care rate. I have let Billing department know.    The Financial aide dept has submitted the medicaid form that we returned to them today to [facility] and hopefully we will be able to proceed with SNF placement.  Webb Silversmith RN [phone]

## 2020-10-14 NOTE — Plan of Care (Signed)

## 2020-10-14 NOTE — Progress Notes (Signed)
[facility] PROGRESS NOTE    Progress Note  [name]  WJX:[id number] DOB: 1950/11/25 DOA: 10/01/2020 PCP: [name], NP     Brief Narrative:   Kristy Cabrera is an 70 y.o. female past medical his history significant for advanced dementia, status post resection for meningioma, seizure disorder unspecified left MCA stroke status post left sided endarterectomy in 2020 enrolled in hospice comes in with a new onset of left facial droop and left-sided weakness with slurred speech, CT of the head showed an acute CVA at the right insula and operculum. Neurology was consulted and work-up is being completed, physical therapy recommended skilled but was denied therefore TOC working on safe disposition.  Patient is medically stable to be transferred to skilled nursing facility.   Assessment/Plan:   Acute right-sided stroke/right MCA territory: Patient is tolerating his diet.   Patient was denied to skilled nursing facility awaiting TOC placement. Patient is medically stable to be transferred to skilled nursing slowly.  Advanced dementia: Continue Aricept.  Seizure disorder: Continue oral Keppra.  Hyperlipidemia: Continue statins.  DVT prophylaxis: lovexno Family Communication:none Status is: Inpatient  Remains inpatient appropriate because:Hemodynamically unstable   Dispo: The patient is from: Home              Anticipated d/c is to: SNF              Patient currently is medically stable to d/c.   Difficult to place patient Yes        Code Status:     Code Status Orders  (From admission, onward)         Start     Ordered   10/01/20 1406  Do not attempt resuscitation (DNR)  Continuous       Question Answer Comment  In the event of cardiac or respiratory ARREST Do not call a "code blue"   In the event of cardiac or respiratory ARREST Do not perform Intubation, CPR, defibrillation or ACLS   In the event of cardiac or respiratory ARREST Use medication  by any route, position, wound care, and other measures to relive pain and suffering. May use oxygen, suction and manual treatment of airway obstruction as needed for comfort.      10/01/20 1407        Code Status History    Date Active Date Inactive Code Status Order ID Comments User Context   10/30/2018 1854 11/02/2018 1935 Full Code [id number]  Sid Falcon, MD Inpatient   10/24/2018 1450 10/29/2018 1951 Full Code [id number]  [name], MD Inpatient   07/04/2016 1621 07/06/2016 1807 Full Code [id number]  [name], MD Inpatient   Advance Care Planning Activity    Advance Directive Documentation   Flowsheet Row Most Recent Value  Type of Advance Directive Out of facility DNR (pink MOST or yellow form)  Pre-existing out of facility DNR order (yellow form or pink MOST form) -  "MOST" Form in Place? -        IV Access:    Peripheral IV   Procedures and diagnostic studies:   No results found.   Medical Consultants:    None.   Subjective:    Kristy Cabrera sleepy sleepy this morning not very talkative  Objective:    Vitals:   10/12/20 2028 10/13/20 0300 10/13/20 1451 10/13/20 2219  BP: 140/70 (!) 160/84 (!) 158/75 (!) 147/90  Pulse: 68 72 75 78  Resp: 18 19 20 20   Temp: 98.2 F (36.8 C) 98.3  F (36.8 C) 98.5 F (36.9 C) 98.4 F (36.9 C)  TempSrc: Oral Oral Oral Oral  SpO2: 98% 95% 97% 98%  Weight:      Height:       SpO2: 98 %   Intake/Output Summary (Last 24 hours) at 10/14/2020 0906 Last data filed at 10/13/2020 2200 Gross per 24 hour  Intake 340 ml  Output -  Net 340 ml   Filed Weights   10/01/20 1338  Weight: 64 kg    Exam: General exam: In no acute distress. Respiratory system: Good air movement and clear to auscultation. Cardiovascular system: S1 & S2 heard, RRR. No JVD. Gastrointestinal system: Abdomen is nondistended, soft and nontender.  Extremities: No pedal edema. Skin: No rashes, lesions or ulcers   Data Reviewed:     Labs: Basic Metabolic Panel: Recent Labs  Lab 10/09/20 0140  NA 140  K 4.0  CL 110  CO2 24  GLUCOSE 96  BUN 29*  CREATININE 0.78  CALCIUM 9.7  MG 2.1   GFR Estimated Creatinine Clearance: 56.5 mL/min (by C-G formula based on SCr of 0.78 mg/dL). Liver Function Tests: No results for input(s): AST, ALT, ALKPHOS, BILITOT, PROT, ALBUMIN in the last 168 hours. No results for input(s): LIPASE, AMYLASE in the last 168 hours. No results for input(s): AMMONIA in the last 168 hours. Coagulation profile No results for input(s): INR, PROTIME in the last 168 hours. COVID-19 Labs  No results for input(s): DDIMER, FERRITIN, LDH, CRP in the last 72 hours.  Lab Results  Component Value Date   [place] NEGATIVE 10/01/2020    CBC: No results for input(s): WBC, NEUTROABS, HGB, HCT, MCV, PLT in the last 168 hours. Cardiac Enzymes: No results for input(s): CKTOTAL, CKMB, CKMBINDEX, TROPONINI in the last 168 hours. BNP (last 3 results) No results for input(s): PROBNP in the last 8760 hours. CBG: No results for input(s): GLUCAP in the last 168 hours. D-Dimer: No results for input(s): DDIMER in the last 72 hours. Hgb A1c: No results for input(s): HGBA1C in the last 72 hours. Lipid Profile: No results for input(s): CHOL, HDL, LDLCALC, TRIG, CHOLHDL, LDLDIRECT in the last 72 hours. Thyroid function studies: No results for input(s): TSH, T4TOTAL, T3FREE, THYROIDAB in the last 72 hours.  Invalid input(s): FREET3 Anemia work up: No results for input(s): VITAMINB12, FOLATE, FERRITIN, TIBC, IRON, RETICCTPCT in the last 72 hours. Sepsis Labs: No results for input(s): PROCALCITON, WBC, LATICACIDVEN in the last 168 hours. Microbiology No results found for this or any previous visit (from the past 240 hour(s)).   Medications:   . aspirin EC  81 mg Oral Daily  . atorvastatin  80 mg Oral q1800  . clopidogrel  75 mg Oral Daily  . donepezil  5 mg Oral QHS  . enoxaparin (LOVENOX)  injection  40 mg Subcutaneous Q24H  . feeding supplement  237 mL Oral TID BM  . levETIRAcetam  750 mg Oral BID  . melatonin  3 mg Oral QHS  . multivitamin with minerals  1 tablet Oral Daily  . sodium chloride flush  3 mL Intravenous Once   Continuous Infusions:    LOS: 13 days   [name]  [facility]  10/14/2020, 9:06 AM

## 2020-10-15 NOTE — Plan of Care (Signed)

## 2020-10-15 NOTE — Progress Notes (Signed)
[facility] PROGRESS NOTE    Progress Note  Kristy Cabrera  CHY:[id number] DOB: 1950-09-23 DOA: 10/01/2020 PCP: [name], NP     Brief Narrative:   Kristy Cabrera is an 70 y.o. female past medical his history significant for advanced dementia, status post resection for meningioma, seizure disorder unspecified left MCA stroke status post left sided endarterectomy in 2020 enrolled in hospice comes in with a new onset of left facial droop and left-sided weakness with slurred speech, CT of the head showed an acute CVA at the right insula and operculum. Neurology was consulted and work-up is being completed, physical therapy recommended skilled but was denied therefore TOC working on safe disposition.  Patient is medically stable to be transferred to skilled nursing facility.   Assessment/Plan:   Acute right-sided stroke/right MCA territory: Patient her tolerating his diet.   Patient was denied to skilled nursing facility awaiting TOC placement. Patient is medically stable to be transferred to skilled nursing slowly.  Advanced dementia: Continue Aricept.  Seizure disorder: Continue oral Keppra.  Hyperlipidemia: Continue statins.  DVT prophylaxis: lovexno Family Communication:none Status is: Inpatient  Remains inpatient appropriate because:Hemodynamically unstable   Dispo: The patient is from: Home              Anticipated d/c is to: SNF              Patient currently is medically stable to d/c.   Difficult to place patient Yes        Code Status:     Code Status Orders  (From admission, onward)         Start     Ordered   10/01/20 1406  Do not attempt resuscitation (DNR)  Continuous       Question Answer Comment  In the event of cardiac or respiratory ARREST Do not call a "code blue"   In the event of cardiac or respiratory ARREST Do not perform Intubation, CPR, defibrillation or ACLS   In the event of cardiac or respiratory ARREST Use  medication by any route, position, wound care, and other measures to relive pain and suffering. May use oxygen, suction and manual treatment of airway obstruction as needed for comfort.      10/01/20 1407        Code Status History    Date Active Date Inactive Code Status Order ID Comments User Context   10/30/2018 1854 11/02/2018 1935 Full Code [id number]  Sid Falcon, MD Inpatient   10/24/2018 1450 10/29/2018 1951 Full Code [id number]  [name], MD Inpatient   07/04/2016 1621 07/06/2016 1807 Full Code [id number]  [name], MD Inpatient   Advance Care Planning Activity    Advance Directive Documentation   Flowsheet Row Most Recent Value  Type of Advance Directive Out of facility DNR (pink MOST or yellow form)  Pre-existing out of facility DNR order (yellow form or pink MOST form) -  "MOST" Form in Place? -        IV Access:    Peripheral IV   Procedures and diagnostic studies:   No results found.   Medical Consultants:    None.   Subjective:    Kristy Cabrera sleepy sleepy this morning not very talkative  Objective:    Vitals:   10/13/20 2219 10/14/20 1355 10/14/20 2300 10/15/20 0500  BP: (!) 147/90 (!) 149/66 (!) 162/65 130/75  Pulse: 78 82 62 67  Resp: 20 17 18 18   Temp: 98.4 F (36.9 C) 98.4  F (36.9 C) 97.6 F (36.4 C) 97.8 F (36.6 C)  TempSrc: Oral  Oral Oral  SpO2: 98% 98% 95% 95%  Weight:      Height:       SpO2: 95 %  No intake or output data in the 24 hours ending 10/15/20 0915 Filed Weights   10/01/20 1338  Weight: 64 kg    Exam: General exam: In no acute distress. Respiratory system: Good air movement and clear to auscultation. Cardiovascular system: S1 & S2 heard, RRR. No JVD. Gastrointestinal system: Abdomen is nondistended, soft and nontender.  Extremities: No pedal edema. Skin: No rashes, lesions or ulcers   Data Reviewed:    Labs: Basic Metabolic Panel: Recent Labs  Lab 10/09/20 0140  NA 140  K 4.0   CL 110  CO2 24  GLUCOSE 96  BUN 29*  CREATININE 0.78  CALCIUM 9.7  MG 2.1   GFR Estimated Creatinine Clearance: 56.5 mL/min (by C-G formula based on SCr of 0.78 mg/dL). Liver Function Tests: No results for input(s): AST, ALT, ALKPHOS, BILITOT, PROT, ALBUMIN in the last 168 hours. No results for input(s): LIPASE, AMYLASE in the last 168 hours. No results for input(s): AMMONIA in the last 168 hours. Coagulation profile No results for input(s): INR, PROTIME in the last 168 hours. COVID-19 Labs  No results for input(s): DDIMER, FERRITIN, LDH, CRP in the last 72 hours.  Lab Results  Component Value Date   [place] NEGATIVE 10/01/2020    CBC: No results for input(s): WBC, NEUTROABS, HGB, HCT, MCV, PLT in the last 168 hours. Cardiac Enzymes: No results for input(s): CKTOTAL, CKMB, CKMBINDEX, TROPONINI in the last 168 hours. BNP (last 3 results) No results for input(s): PROBNP in the last 8760 hours. CBG: No results for input(s): GLUCAP in the last 168 hours. D-Dimer: No results for input(s): DDIMER in the last 72 hours. Hgb A1c: No results for input(s): HGBA1C in the last 72 hours. Lipid Profile: No results for input(s): CHOL, HDL, LDLCALC, TRIG, CHOLHDL, LDLDIRECT in the last 72 hours. Thyroid function studies: No results for input(s): TSH, T4TOTAL, T3FREE, THYROIDAB in the last 72 hours.  Invalid input(s): FREET3 Anemia work up: No results for input(s): VITAMINB12, FOLATE, FERRITIN, TIBC, IRON, RETICCTPCT in the last 72 hours. Sepsis Labs: No results for input(s): PROCALCITON, WBC, LATICACIDVEN in the last 168 hours. Microbiology No results found for this or any previous visit (from the past 240 hour(s)).   Medications:   . aspirin EC  81 mg Oral Daily  . atorvastatin  80 mg Oral q1800  . clopidogrel  75 mg Oral Daily  . donepezil  5 mg Oral QHS  . enoxaparin (LOVENOX) injection  40 mg Subcutaneous Q24H  . feeding supplement  237 mL Oral TID BM  .  levETIRAcetam  750 mg Oral BID  . melatonin  3 mg Oral QHS  . multivitamin with minerals  1 tablet Oral Daily  . sodium chloride flush  3 mL Intravenous Once   Continuous Infusions:    LOS: 14 days   [name]  [facility]  10/15/2020, 9:15 AM

## 2020-10-16 LAB — GLUCOSE, CAPILLARY: Glucose-Capillary: 146 mg/dL — ABNORMAL HIGH (ref 70–99)

## 2020-10-16 MED ORDER — ROSUVASTATIN CALCIUM 20 MG PO TABS: 20.0000 mg | ORAL_TABLET | Freq: Every day | ORAL | 11 refills | Status: AC

## 2020-10-16 MED ORDER — ASPIRIN 81 MG PO TBEC: 81.0000 mg | DELAYED_RELEASE_TABLET | Freq: Every day | ORAL | 11 refills | Status: DC

## 2020-10-16 NOTE — Progress Notes (Signed)
  Speech Language Pathology Treatment: Cognitive-Linquistic;Dysphagia (Dysarthria)  Patient Details Name: Kristy Cabrera MRN: [id number] DOB: 1951/04/08 Today's Date: 10/16/2020 Time: 1105-1130 SLP Time Calculation (min) (ACUTE ONLY): 25.05 min  Assessment / Plan / Recommendation Clinical Impression  Pt was seen for treatment. She was alert and cooperative during the session. She was able to recall some compensatory strategies for dysarthria from the prior session with cues. Oral care was provided and she tolerated 6/6 trials of thin water via cup without overt s/sx of aspiration. Oral holding continues to be noted. Pt demonstrated 50% accuracy with use of increased vocal intensity and overarticulation at the phrase level increasing to 100% with verbal prompts. Cueing was frequently needed during structured conversation, her overall speech intelligibility was improved compared to prior sessions. It is recommended that her current diet (regular/nectar thick liquids) be continued. However, advancement to thin liquids may be imminent if her tolerance of thin liquids is maintained in subsequent sessions. SLP will continue to follow pt.    HPI HPI: 70 y.o. female with medical history significant for advanced dementia, HTN, meningioma s/p resection, seizure, unspecified left MCA stroke s/p left-sided endarterectomy in 2020, HLD, currently enrolled in home hospice presented with new onset of left facial droop, left-sided weakness and slurred speech.  CT of the head showed acute stroke on right insula and operculum. PTA pt fed herself a regular diet. MBSS 10/09/20 indicated intermittent aspiratin with thin liquids that was inconsistently sensed with diet recommendations of nectar thick and regular textures.      SLP Plan  Continue with current plan of care       Recommendations  Diet recommendations: Regular;Nectar-thick liquid Liquids provided via: Cup;Straw Medication Administration: Whole meds with  puree Supervision: Staff to assist with self feeding;Full supervision/cueing for compensatory strategies Compensations: Slow rate;Small sips/bites Postural Changes and/or Swallow Maneuvers: Seated upright 90 degrees                Oral Care Recommendations: Oral care BID;Oral care prior to ice chip/H20 Follow up Recommendations: Skilled Nursing facility SLP Visit Diagnosis: Dysphagia, oropharyngeal phase (R13.12);Dysarthria and anarthria (R47.1) Plan: Continue with current plan of care       Ericia Moxley I. Hardin Negus, [place], [place] Office number [phone] Pager [place] 10/16/2020, 11:33 AM

## 2020-10-16 NOTE — Progress Notes (Signed)
Physical Therapy Treatment Patient Details Name: Kristy Cabrera MRN: [id number] DOB: Aug 05, 1950 Today's Date: 10/16/2020    History of Present Illness 70 y.o. female presented 3/13 with new onset of left facial droop, left-sided weakness and slurred speech.  CT of the head showed acute stroke on right insula and operculum PMH:  advanced dementia, HTN, meningioma s/p resection, seizure, unspecified left MCA stroke s/p left-sided endarterectomy in 2020, HLD, currently enrolled in home hospice    PT Comments    Pt received in bed, needing assistance and increased time for pericare due to significant purewick leakage, soiled/soaked linens, and large BM. Generally min A for all mobility. Repetitive cueing for sequencing and to stay on task. Slight L lean noted in standing, but no LOB. Pt with shuffling gait/movement from bed to chair. Able to follow simple commands with increased time and after cues are repeated multiple times. Pt left in chair with all needs met, call bell within reach, chair alarm active, and nursing staff present for hygiene care. Will continue to follow acutely.   Follow Up Recommendations  SNF;Supervision/Assistance - 24 hour     Equipment Recommendations  None recommended by PT    Recommendations for Other Services       Precautions / Restrictions Precautions Precautions: Fall Precaution Comments: advanced dementia Restrictions Weight Bearing Restrictions: No    Mobility  Bed Mobility Overal bed mobility: Needs Assistance Bed Mobility: Rolling;Supine to Sit Rolling: Min assist   Supine to sit: Min assist;HOB elevated     General bed mobility comments: Min A for rolling for pericare with use of bedrails and HOB elevated 30 degrees, tactile/verbal cueing for handplacement on rail, min A to come into sitting and scoot forward to EOB    Transfers Overall transfer level: Needs assistance Equipment used: Rolling walker (2 wheeled) Transfers: Sit to/from  [facility] Sit to Stand: Min assist Stand pivot transfers: Min assist       General transfer comment: Repeated cueing to come into stand, pt with poor handplacement pulling on RW, min A for steadying and navigating RW to chair  Ambulation/Gait             General Gait Details: deferred   Stairs             Wheelchair Mobility    Modified Rankin (Stroke Patients Only)       Balance Overall balance assessment: Needs assistance   Sitting balance-Leahy Scale: Fair Sitting balance - Comments: EOB and in chair without UE support   Standing balance support: Bilateral upper extremity supported Standing balance-Leahy Scale: Poor Standing balance comment: Reliant on B UE support and external assistance, slight L lean in standing                            Cognition Arousal/Alertness: Awake/alert Behavior During Therapy: WFL for tasks assessed/performed Overall Cognitive Status: No family/caregiver present to determine baseline cognitive functioning                                 General Comments: pt following one step commands with increased time and increased/repeated cueing. Was able to state "I'm doing okay" when asked "how are you"      Exercises      General Comments General comments (skin integrity, edema, etc.): Pt received call at end of session, seemed attentative to whomever was talking on phone and  able to give single-word verbal responses      Pertinent Vitals/Pain Pain Assessment: Faces Faces Pain Scale: No hurt Pain Intervention(s): Monitored during session;Repositioned    Home Living                      Prior Function            PT Goals (current goals can now be found in the care plan section) Acute Rehab PT Goals Patient Stated Goal: to get up    Frequency    Min 3X/week      PT Plan Current plan remains appropriate    Co-evaluation              AM-PAC PT "6  Clicks" Mobility   Outcome Measure  Help needed turning from your back to your side while in a flat bed without using bedrails?: A Little Help needed moving from lying on your back to sitting on the side of a flat bed without using bedrails?: A Little Help needed moving to and from a bed to a chair (including a wheelchair)?: A Little Help needed standing up from a chair using your arms (e.g., wheelchair or bedside chair)?: A Little Help needed to walk in hospital room?: A Little Help needed climbing 3-5 steps with a railing? : Total 6 Click Score: 16    End of Session Equipment Utilized During Treatment: Gait belt Activity Tolerance: Patient tolerated treatment well Patient left: in chair;with call bell/phone within reach;with chair alarm set;with nursing/sitter in room Nurse Communication: Mobility status PT Visit Diagnosis: Muscle weakness (generalized) (M62.81);Difficulty in walking, not elsewhere classified (R26.2);Other abnormalities of gait and mobility (R26.89)     Time: 8208-1388 PT Time Calculation (min) (ACUTE ONLY): 22 min  Charges:  $Therapeutic Activity: 8-22 mins                     Rosita Kea, SPT

## 2020-10-16 NOTE — Care Management Important Message (Signed)
Important Message  Patient Details  Name: Kristy Cabrera MRN: [id number] Date of Birth: 05/12/1951   Medicare Important Message Given:  Yes     Pariss Hommes [address] 10/16/2020, 4:48 PM

## 2020-10-16 NOTE — Discharge Summary (Addendum)
Physician Discharge Summary  Kristy Cabrera AJO:[id number] DOB: 31-Dec-1950 DOA: 10/01/2020  PCP: [name], NP  Admit date: 10/01/2020 Discharge date: 10/16/2020  Admitted From: Home Disposition:  SNF  Recommendations for Outpatient Follow-up:  1. Follow up with PCP in 1-2 weeks 2. Please obtain BMP/CBC in one week   Home Health:no Equipment/Devices:None  Discharge Condition:Stable CODE STATUS:Full Diet recommendation: Heart Healthy  Brief/Interim Summary: 70 y.o. female past medical his history significant for advanced dementia, status post resection for meningioma, seizure disorder unspecified left MCA stroke status post left sided endarterectomy in 2020 enrolled in hospice comes in with a new onset of left facial droop and left-sided weakness with slurred speech, CT of the head showed an acute CVA at the right insula and operculum.  Discharge Diagnoses:  Active Problems:   COPD (chronic obstructive pulmonary disease) (HCC)   Acute ischemic stroke (HCC)   Stroke (cerebrum) (HCC)   Malnutrition of moderate degree   Hospice care patient   Acute right-sided stroke/right MCA territory: MRI of the brain showed multiple small right acute MCA infarct. CTA showed new proximal right M2 occlusion with severe atherosclerosis.  Neurology was consulted recommended aspirin and Plavix. She was started on statins should continue as an outpatient. Physical therapy evaluated the patient and recommended skilled nursing facility.  Advance dementia: Continue Aricept.  Seizure disorder: Continue Keppra.  Hyperlipidemia: Continue statins. Discharge Instructions  Discharge Instructions    Diet - low sodium heart healthy   Complete by: As directed    Increase activity slowly   Complete by: As directed      Allergies as of 10/16/2020   No Known Allergies     Medication List    TAKE these medications   amLODipine 5 MG tablet Commonly known as: NORVASC Take 5 mg by mouth  daily.   aspirin 81 MG EC tablet Take 1 tablet (81 mg total) by mouth daily. Swallow whole. Start taking on: October 17, 2020   calcium-vitamin D 500-200 MG-UNIT tablet Commonly known as: OSCAL WITH D Take 1 tablet by mouth 2 (two) times daily.   clopidogrel 75 MG tablet Commonly known as: PLAVIX Take 75 mg by mouth daily.   donepezil 5 MG tablet Commonly known as: ARICEPT Take 5 mg by mouth at bedtime.   levETIRAcetam 750 MG tablet Commonly known as: KEPPRA Take 750 mg by mouth 2 (two) times daily.   loperamide 2 MG tablet Commonly known as: IMODIUM A-D Take 2 mg by mouth every other day.   losartan 25 MG tablet Commonly known as: COZAAR Take 1 tablet (25 mg total) by mouth daily.   Melatonin 3 MG Caps Take 3 mg by mouth at bedtime.   memantine 10 MG tablet Commonly known as: NAMENDA Take 10 mg by mouth daily.   rosuvastatin 20 MG tablet Commonly known as: Crestor Take 1 tablet (20 mg total) by mouth daily.   SUPER B COMPLEX PO Take 1 tablet by mouth daily.   vitamin B-1 250 MG tablet Take 250 mg by mouth daily.       No Known Allergies  Consultations:  Neurology   Procedures/Studies: CT ANGIO HEAD W OR WO CONTRAST  Result Date: 10/01/2020 CLINICAL DATA:  Left-sided weakness. Acute right insular infarct on CT. EXAM: CT ANGIOGRAPHY HEAD AND NECK TECHNIQUE: Multidetector CT imaging of the head and neck was performed using the standard protocol during bolus administration of intravenous contrast. Multiplanar CT image reconstructions and MIPs were obtained to evaluate the vascular anatomy. Carotid  stenosis measurements (when applicable) are obtained utilizing NASCET criteria, using the distal internal carotid diameter as the denominator. CONTRAST:  65mL OMNIPAQUE IOHEXOL 350 MG/ML SOLN COMPARISON:  10/18/2019 FINDINGS: CTA NECK FINDINGS Aortic arch: Incomplete imaging of the aortic arch with prominent calcified plaque. Common origin of the brachiocephalic and  left common carotid arteries, a normal variant. Similar appearance of proximal bilateral subclavian artery stenoses due to calcified plaque, severe on the right and moderate on the left. Right carotid system: Patent with prominent, predominantly calcified plaque scattered throughout the common carotid and proximal internal carotid arteries resulting in unchanged 50% mid common carotid artery stenosis. No significant ICA stenosis. Left carotid system: Patent following endarterectomy without recurrent stenosis. Vertebral arteries: The vertebral arteries are patent and codominant with scattered atherosclerotic plaque bilaterally. Unchanged moderate right and mild left vertebral artery origin stenoses. Skeleton: Mild disc degeneration in the cervical spine. Asymmetrically advanced left facet arthrosis at C3-4 and C4-5 with ankylosis at the latter. Other neck: No evidence of cervical lymphadenopathy. Unchanged coarsely calcified 1.6 cm left thyroid nodule. In the setting of significant comorbidities or limited life expectancy, no follow-up recommended (ref: J Am Coll Radiol. 2015 Feb;12(2): 143-50). Upper chest: Mild-to-moderate centrilobular emphysema and minimal biapical lung scarring. Review of the MIP images confirms the above findings CTA HEAD FINDINGS Anterior circulation: The internal carotid arteries are patent from skull base to carotid termini with calcified plaque resulting in up to mild cavernous segment stenosis bilaterally, unchanged. A moderate proximal right M1 stenosis is unchanged. There is a new 1 cm long proximal right M2 occlusion with distal reconstitution. A severe stenosis was present in this location on the prior CTA. The left MCA is patent without evidence of a significant proximal stenosis. There is an unchanged severe proximal left A1 stenosis with patent but diminutive A1 segment more distally. The right A1 segment is widely patent, and the left ACA is primarily supplied by the anterior  communicating artery. There are severe bilateral A2 stenoses with chronically poor opacification of the distal left A2 segment. No aneurysm is identified. Posterior circulation: The intracranial vertebral arteries are widely patent to the basilar. Patent PICA and SCA origins are identified bilaterally. The basilar artery is widely patent. There is a moderate-sized right posterior communicating artery. Both PCAs are patent with unchanged moderate right P1 and severe left P2 stenoses. No aneurysm is identified. Venous sinuses: As permitted by contrast timing, patent. Anatomic variants: None. Review of the MIP images confirms the above findings IMPRESSION: 1. New proximal right M2 occlusion with distal reconstitution. 2. Similar appearance of advanced intracranial atherosclerosis elsewhere with severe anterior and posterior circulation stenoses as above. 3. Unchanged 50% mid right common carotid artery stenosis. 4. Patent left carotid endarterectomy without recurrent stenosis. 5. Unchanged severe right and moderate left proximal subclavian artery stenoses. 6. Unchanged moderate right and mild left vertebral artery origin stenoses. 7. Aortic Atherosclerosis (ICD10-I70.0) and Emphysema (ICD10-J43.9). Electronically Signed   By: Logan Bores M.D.   On: 10/01/2020 15:51   CT ANGIO NECK W OR WO CONTRAST  Result Date: 10/01/2020 CLINICAL DATA:  Left-sided weakness. Acute right insular infarct on CT. EXAM: CT ANGIOGRAPHY HEAD AND NECK TECHNIQUE: Multidetector CT imaging of the head and neck was performed using the standard protocol during bolus administration of intravenous contrast. Multiplanar CT image reconstructions and MIPs were obtained to evaluate the vascular anatomy. Carotid stenosis measurements (when applicable) are obtained utilizing NASCET criteria, using the distal internal carotid diameter as the denominator. CONTRAST:  52mL  OMNIPAQUE IOHEXOL 350 MG/ML SOLN COMPARISON:  10/18/2019 FINDINGS: CTA NECK  FINDINGS Aortic arch: Incomplete imaging of the aortic arch with prominent calcified plaque. Common origin of the brachiocephalic and left common carotid arteries, a normal variant. Similar appearance of proximal bilateral subclavian artery stenoses due to calcified plaque, severe on the right and moderate on the left. Right carotid system: Patent with prominent, predominantly calcified plaque scattered throughout the common carotid and proximal internal carotid arteries resulting in unchanged 50% mid common carotid artery stenosis. No significant ICA stenosis. Left carotid system: Patent following endarterectomy without recurrent stenosis. Vertebral arteries: The vertebral arteries are patent and codominant with scattered atherosclerotic plaque bilaterally. Unchanged moderate right and mild left vertebral artery origin stenoses. Skeleton: Mild disc degeneration in the cervical spine. Asymmetrically advanced left facet arthrosis at C3-4 and C4-5 with ankylosis at the latter. Other neck: No evidence of cervical lymphadenopathy. Unchanged coarsely calcified 1.6 cm left thyroid nodule. In the setting of significant comorbidities or limited life expectancy, no follow-up recommended (ref: J Am Coll Radiol. 2015 Feb;12(2): 143-50). Upper chest: Mild-to-moderate centrilobular emphysema and minimal biapical lung scarring. Review of the MIP images confirms the above findings CTA HEAD FINDINGS Anterior circulation: The internal carotid arteries are patent from skull base to carotid termini with calcified plaque resulting in up to mild cavernous segment stenosis bilaterally, unchanged. A moderate proximal right M1 stenosis is unchanged. There is a new 1 cm long proximal right M2 occlusion with distal reconstitution. A severe stenosis was present in this location on the prior CTA. The left MCA is patent without evidence of a significant proximal stenosis. There is an unchanged severe proximal left A1 stenosis with patent but  diminutive A1 segment more distally. The right A1 segment is widely patent, and the left ACA is primarily supplied by the anterior communicating artery. There are severe bilateral A2 stenoses with chronically poor opacification of the distal left A2 segment. No aneurysm is identified. Posterior circulation: The intracranial vertebral arteries are widely patent to the basilar. Patent PICA and SCA origins are identified bilaterally. The basilar artery is widely patent. There is a moderate-sized right posterior communicating artery. Both PCAs are patent with unchanged moderate right P1 and severe left P2 stenoses. No aneurysm is identified. Venous sinuses: As permitted by contrast timing, patent. Anatomic variants: None. Review of the MIP images confirms the above findings IMPRESSION: 1. New proximal right M2 occlusion with distal reconstitution. 2. Similar appearance of advanced intracranial atherosclerosis elsewhere with severe anterior and posterior circulation stenoses as above. 3. Unchanged 50% mid right common carotid artery stenosis. 4. Patent left carotid endarterectomy without recurrent stenosis. 5. Unchanged severe right and moderate left proximal subclavian artery stenoses. 6. Unchanged moderate right and mild left vertebral artery origin stenoses. 7. Aortic Atherosclerosis (ICD10-I70.0) and Emphysema (ICD10-J43.9). Electronically Signed   By: Logan Bores M.D.   On: 10/01/2020 15:51   MR BRAIN WO CONTRAST  Result Date: 10/01/2020 CLINICAL DATA:  Left-sided weakness. Acute right insular infarct on CT. Right M2 occlusion on CTA. EXAM: MRI HEAD WITHOUT CONTRAST TECHNIQUE: Multiplanar, multiecho pulse sequences of the brain and surrounding structures were obtained without intravenous contrast. COMPARISON:  Head CT and CTA 10/01/2020 and MRI 10/18/2019 FINDINGS: Brain: There are multiple small acute right MCA territory infarcts with the most confluent area of infarct involving the insula and frontal  operculum. Small scattered acute infarcts are noted elsewhere involving cortex and white matter of the right parietal greater than right frontal lobes. Extensive encephalomalacia anteriorly in  both frontal lobes is unchanged from the prior MRI, as are chronic hemorrhagic infarcts in the bilateral basal ganglia. There are also unchanged small chronic infarcts in the left superior frontal gyrus and both thalami. T2 hyperintensities elsewhere in the cerebral white matter bilaterally are unchanged and nonspecific but compatible with mild chronic small vessel ischemic disease. No mass, midline shift, or extra-axial fluid collection is identified. There is ex vacuo dilatation of the lateral and third ventricles related to bifrontal encephalomalacia and global cerebral atrophy. Vascular: Abnormal FLAIR signal and right MCA branch vessels with a proximal M2 occlusion demonstrated on CTA. Skull and upper cervical spine: Bifrontal craniotomy. Sinuses/Orbits: Unremarkable orbits. Paranasal sinuses and mastoid air cells are clear. Other: None. IMPRESSION: 1. Multiple small acute right MCA territory infarcts. 2. Chronic ischemia and bifrontal encephalomalacia as detailed above. Electronically Signed   By: Logan Bores M.D.   On: 10/01/2020 18:24   DG Swallowing Func-Speech Pathology  Result Date: 10/09/2020 Objective Swallowing Evaluation: Type of Study: MBS-Modified Barium Swallow Study  Patient Details Name: Kristy Cabrera MRN: [id number] Date of Birth: 1951/01/16 Today's Date: 10/09/2020 Time: SLP Start Time (ACUTE ONLY): 1248 -SLP Stop Time (ACUTE ONLY): 1259 SLP Time Calculation (min) (ACUTE ONLY): 11.65 min Past Medical History: Past Medical History: Diagnosis Date . Brain tumor (benign) ([place])  . Dementia ([place])  . Episodic mood disorder ([place])  . Hyperlipidemia  . Hypertension  . Insomnia  . Osteopenia  . Weakness of both legs  Past Surgical History: Past Surgical History: Procedure Laterality Date . Brain tumor removal    . ENDARTERECTOMY Left 10/28/2018  Procedure: Left Carotid ENDARTERECTOMY;  Surgeon: [name], MD;  Location: [place];  Service: Vascular;  Laterality: Left; [facility] MELANOMA EXCISION  1987  BACK HPI: 70 y.o. female with medical history significant for advanced dementia, HTN, meningioma s/p resection, seizure, unspecified left MCA stroke s/p left-sided endarterectomy in 2020, HLD, currently enrolled in home hospice presented with new onset of left facial droop, left-sided weakness and slurred speech.  CT of the head showed acute stroke on right insula and operculum. PTA pt fed herself a regular diet. Assessment / Plan / Recommendation CHL IP CLINICAL IMPRESSIONS 10/09/2020 Clinical Impression Pt presented with oropharyngeal dysphagia characterized by impaired posterior bolus propulsion, oral holding, and impaired timing of the swallow. She demonstrated difficulty with A-P transport and inconsistent sensed aspiration (PAS 7) of thin liquids. Pt was unable to demonstrate compensatory strategies due to her difficulty following commands. Despite repeated attempts and use of various liquids and puree, pt was unable to demonstrate A-P transport of the barium tablet and ultimately masticated it. A regular texture diet with nectar thick liquids is recommended at this time. Pt's husband was contacted via phone and educated regarding the results of the study and recommendations. He verbalized understanding and agreement. SLP will follow for dysphagia treatment. SLP Visit Diagnosis Dysphagia, oropharyngeal phase (R13.12) Attention and concentration deficit following -- Frontal lobe and executive function deficit following -- Impact on safety and function Mild aspiration risk;Moderate aspiration risk   CHL IP TREATMENT RECOMMENDATION 10/09/2020 Treatment Recommendations Therapy as outlined in treatment plan below   Prognosis 10/09/2020 Prognosis for Safe Diet Advancement Good Barriers to Reach Goals Cognitive deficits Barriers/Prognosis  Comment -- CHL IP DIET RECOMMENDATION 10/09/2020 SLP Diet Recommendations Regular solids;Nectar thick liquid Liquid Administration via Cup;Straw Medication Administration Crushed with puree Compensations Slow rate;Small sips/bites Postural Changes Seated upright at 90 degrees   CHL IP OTHER RECOMMENDATIONS 10/09/2020 Recommended Consults -- Oral Care  Recommendations Oral care BID Other Recommendations --   CHL IP FOLLOW UP RECOMMENDATIONS 10/09/2020 Follow up Recommendations Skilled Nursing facility   [facility] IP FREQUENCY AND DURATION 10/09/2020 Speech Therapy Frequency (ACUTE ONLY) min 2x/week Treatment Duration 2 weeks      CHL IP ORAL PHASE 10/09/2020 Oral Phase Impaired Oral - Pudding Teaspoon -- Oral - Pudding Cup -- Oral - Honey Teaspoon -- Oral - Honey Cup -- Oral - Nectar Teaspoon -- Oral - Nectar Cup -- Oral - Nectar Straw Reduced posterior propulsion;Holding of bolus Oral - Thin Teaspoon -- Oral - Thin Cup Delayed oral transit;Holding of bolus Oral - Thin Straw Delayed oral transit;Holding of bolus;Reduced posterior propulsion Oral - Puree Delayed oral transit;Holding of bolus;Reduced posterior propulsion Oral - Mech Soft -- Oral - Regular Delayed oral transit;Holding of bolus Oral - Multi-Consistency -- Oral - Pill Delayed oral transit;Holding of bolus;Reduced posterior propulsion Oral Phase - Comment --  CHL IP PHARYNGEAL PHASE 10/09/2020 Pharyngeal Phase Impaired Pharyngeal- Pudding Teaspoon -- Pharyngeal -- Pharyngeal- Pudding Cup -- Pharyngeal -- Pharyngeal- Honey Teaspoon -- Pharyngeal -- Pharyngeal- Honey Cup -- Pharyngeal -- Pharyngeal- Nectar Teaspoon -- Pharyngeal -- Pharyngeal- Nectar Cup -- Pharyngeal -- Pharyngeal- Nectar Straw -- Pharyngeal -- Pharyngeal- Thin Teaspoon -- Pharyngeal -- Pharyngeal- Thin Cup Penetration/Aspiration during swallow;Trace aspiration;Moderate aspiration;Delayed swallow initiation-vallecula;Delayed swallow initiation-pyriform sinuses Pharyngeal Material enters airway, passes  BELOW cords and not ejected out despite cough attempt by patient Pharyngeal- Thin Straw Penetration/Aspiration during swallow;Trace aspiration;Moderate aspiration;Delayed swallow initiation-vallecula;Delayed swallow initiation-pyriform sinuses Pharyngeal Material enters airway, passes BELOW cords and not ejected out despite cough attempt by patient Pharyngeal- Puree Delayed swallow initiation-vallecula Pharyngeal -- Pharyngeal- Mechanical Soft -- Pharyngeal -- Pharyngeal- Regular Delayed swallow initiation-vallecula Pharyngeal -- Pharyngeal- Multi-consistency -- Pharyngeal -- Pharyngeal- Pill Delayed swallow initiation-vallecula Pharyngeal -- Pharyngeal Comment --  CHL IP CERVICAL ESOPHAGEAL PHASE 10/09/2020 Cervical Esophageal Phase WFL Pudding Teaspoon -- Pudding Cup -- Honey Teaspoon -- Honey Cup -- Nectar Teaspoon -- Nectar Cup -- Nectar Straw -- Thin Teaspoon -- Thin Cup -- Thin Straw -- Puree -- Mechanical Soft -- Regular -- Multi-consistency -- Pill -- Cervical Esophageal Comment -- Shanika I. Hardin Negus, [place], [place] Office number [phone] Pager [phone] Horton Marshall 10/09/2020, 2:51 PM              ECHOCARDIOGRAM COMPLETE  Result Date: 10/02/2020    ECHOCARDIOGRAM REPORT   Patient Name:   Kristy Cabrera Date of Exam: 10/02/2020 Medical Rec #:  [id number]       Height:       64.0 in Accession #:    [id number]      Weight:       141.0 lb Date of Birth:  [birth year]       BSA:          1.686 m Patient Age:    24 years        BP:           118/106 mmHg Patient Gender: F               HR:           85 bpm. Exam Location:  Inpatient Procedure: 2D Echo, Cardiac Doppler, Color Doppler and Intracardiac            Opacification Agent Indications:    CVA  History:        Patient has no prior history of Echocardiogram examinations.  Risk Factors:Hypertension and Dyslipidemia.  Sonographer:    [place] Referring Phys: [id number] [place]  1.  Left ventricular ejection fraction, by estimation, is 55 to 60%. The left ventricle has normal function. The left ventricle has no regional wall motion abnormalities. Left ventricular diastolic parameters are consistent with Grade I diastolic dysfunction (impaired relaxation).  2. Right ventricular systolic function is normal. The right ventricular size is normal.  3. The mitral valve is grossly normal. No evidence of mitral valve regurgitation.  4. The aortic valve is grossly normal. Aortic valve regurgitation is not visualized. FINDINGS  Left Ventricle: Left ventricular ejection fraction, by estimation, is 55 to 60%. The left ventricle has normal function. The left ventricle has no regional wall motion abnormalities. The left ventricular internal cavity size was normal in size. There is  no left ventricular hypertrophy. Left ventricular diastolic parameters are consistent with Grade I diastolic dysfunction (impaired relaxation). Right Ventricle: The right ventricular size is normal. No increase in right ventricular wall thickness. Right ventricular systolic function is normal. Left Atrium: Left atrial size was normal in size. Right Atrium: Right atrial size was normal in size. Pericardium: There is no evidence of pericardial effusion. Mitral Valve: The mitral valve is grossly normal. No evidence of mitral valve regurgitation. MV peak gradient, 5.1 mmHg. The mean mitral valve gradient is 2.0 mmHg. Tricuspid Valve: The tricuspid valve is not well visualized. Tricuspid valve regurgitation is not demonstrated. Aortic Valve: The aortic valve is grossly normal. Aortic valve regurgitation is not visualized. Aortic valve mean gradient measures 2.0 mmHg. Aortic valve peak gradient measures 3.6 mmHg. Pulmonic Valve: The pulmonic valve was not well visualized. Pulmonic valve regurgitation is not visualized. Aorta: The aortic root was not well visualized. IAS/Shunts: The atrial septum is grossly normal.  LEFT VENTRICLE PLAX  2D LVIDd:         4.50 cm Diastology LVIDs:         2.10 cm LV e' medial:    6.64 cm/s                        LV E/e' medial:  6.4                        LV e' lateral:   6.74 cm/s                        LV E/e' lateral: 6.4  RIGHT VENTRICLE RV S prime:     12.10 cm/s TAPSE (M-mode): 2.1 cm LEFT ATRIUM           Index       RIGHT ATRIUM           Index LA Vol (A4C): 30.3 ml 17.97 ml/m RA Area:     14.20 cm                                   RA Volume:   37.80 ml  22.42 ml/m  AORTIC VALVE AV Vmax:           94.80 cm/s AV Vmean:          66.800 cm/s AV VTI:            0.245 m AV Peak Grad:      3.6 mmHg AV Mean Grad:      2.0  mmHg LVOT Vmax:         53.50 cm/s LVOT Vmean:        37.200 cm/s LVOT VTI:          0.116 m LVOT/AV VTI ratio: 0.47 MITRAL VALVE MV Area (PHT): 1.72 cm    SHUNTS MV Peak grad:  5.1 mmHg    Systemic VTI: 0.12 m MV Mean grad:  2.0 mmHg MV Vmax:       1.13 m/s MV Vmean:      64.3 cm/s MV Decel Time: 442 msec MV E velocity: 42.80 cm/s MV A velocity: 85.30 cm/s MV E/A ratio:  0.50 Mertie Moores MD Electronically signed by Mertie Moores MD Signature Date/Time: 10/02/2020/10:07:13 AM    Final    CT HEAD CODE STROKE WO CONTRAST  Result Date: 10/01/2020 CLINICAL DATA:  Code stroke. 70 year old female with facial droop and left side weakness. EXAM: CT HEAD WITHOUT CONTRAST TECHNIQUE: Contiguous axial images were obtained from the base of the skull through the vertex without intravenous contrast. COMPARISON:  Brain MRI 10/18/2019.  Head CT 10/18/2019 and earlier. FINDINGS: Brain: Fairly advanced chronic encephalomalacia in both anterior frontal lobes. Ex vacuo enlargement of the frontal horns with mild additional generalized ex vacuo appearing ventricular enlargement. Superimposed abnormal hypodensity at the right insula and operculum on series 2, image 16. But elsewhere gray-white matter differentiation appears stable, with chronic encephalomalacia in the right basal ganglia. No acute intracranial  hemorrhage identified. No midline shift, mass effect, or evidence of intracranial mass lesion. Vascular: Calcified atherosclerosis at the skull base. No suspicious intracranial vascular hyperdensity. Skull: Sequelae of bifrontal craniotomy, stable. No acute osseous abnormality identified. Sinuses/Orbits: Visualized paranasal sinuses and mastoids are clear. Other: No definite gaze deviation, acute scalp soft tissue finding. ASPECTS [facility] Stroke Program Early CT Score) Total score (0-10 with 10 being normal): 8 (acutely abnormal right insula and M2 segment. Chronically abnormal right basal ganglia, anterior frontal encephalomalacia). IMPRESSION: 1. Positive for acute cortically based infarct of the right insula and operculum. ASPECTS 8. No associated hemorrhage or mass effect. 2. Underlying chronic bifrontal encephalomalacia including chronic involvement of the bilateral basal ganglia. Prior bifrontal craniotomy. 3. These results were communicated to Dr. Curly Shores at 10:56 am on 10/01/2020 by text page via the [facility] messaging system. Electronically Signed   By: Genevie Ann M.D.   On: 10/01/2020 10:57      Subjective: No new complaints.  Discharge Exam: Vitals:   10/16/20 0342 10/16/20 1113  BP: (!) 144/52 (!) 122/56  Pulse: 62 79  Resp: 20 20  Temp: 97.6 F (36.4 C) 98.3 F (36.8 C)  SpO2: 93% 93%   Vitals:   10/15/20 1430 10/15/20 2048 10/16/20 0342 10/16/20 1113  BP: (!) 143/58 (!) 155/84 (!) 144/52 (!) 122/56  Pulse: 75 74 62 79  Resp: 17 18 20 20   Temp: 98.3 F (36.8 C) 98 F (36.7 C) 97.6 F (36.4 C) 98.3 F (36.8 C)  TempSrc:  Oral  Oral  SpO2: 98% 98% 93% 93%  Weight:      Height:        General: Pt is alert, awake, not in acute distress Cardiovascular: RRR, S1/S2 +, no rubs, no gallops Respiratory: CTA bilaterally, no wheezing, no rhonchi Abdominal: Soft, NT, ND, bowel sounds + Extremities: no edema, no cyanosis    The results of significant diagnostics from this  hospitalization (including imaging, microbiology, ancillary and laboratory) are listed below for reference.     Microbiology: No results found for  this or any previous visit (from the past 240 hour(s)).   Labs: BNP (last 3 results) No results for input(s): BNP in the last 8760 hours. Basic Metabolic Panel: No results for input(s): NA, K, CL, CO2, GLUCOSE, BUN, CREATININE, CALCIUM, MG, PHOS in the last 168 hours. Liver Function Tests: No results for input(s): AST, ALT, ALKPHOS, BILITOT, PROT, ALBUMIN in the last 168 hours. No results for input(s): LIPASE, AMYLASE in the last 168 hours. No results for input(s): AMMONIA in the last 168 hours. CBC: No results for input(s): WBC, NEUTROABS, HGB, HCT, MCV, PLT in the last 168 hours. Cardiac Enzymes: No results for input(s): CKTOTAL, CKMB, CKMBINDEX, TROPONINI in the last 168 hours. BNP: Invalid input(s): POCBNP CBG: No results for input(s): GLUCAP in the last 168 hours. D-Dimer No results for input(s): DDIMER in the last 72 hours. Hgb A1c No results for input(s): HGBA1C in the last 72 hours. Lipid Profile No results for input(s): CHOL, HDL, LDLCALC, TRIG, CHOLHDL, LDLDIRECT in the last 72 hours. Thyroid function studies No results for input(s): TSH, T4TOTAL, T3FREE, THYROIDAB in the last 72 hours.  Invalid input(s): FREET3 Anemia work up No results for input(s): VITAMINB12, FOLATE, FERRITIN, TIBC, IRON, RETICCTPCT in the last 72 hours. Urinalysis    Component Value Date/Time   COLORURINE YELLOW 10/30/2018 1530   APPEARANCEUR HAZY (A) 10/30/2018 1530   LABSPEC 1.014 10/30/2018 1530   PHURINE 7.0 10/30/2018 1530   GLUCOSEU NEGATIVE 10/30/2018 1530   HGBUR NEGATIVE 10/30/2018 [place] 10/30/2018 1530   [place] 10/30/2018 1530   PROTEINUR NEGATIVE 10/30/2018 1530   NITRITE NEGATIVE 10/30/2018 1530   LEUKOCYTESUR NEGATIVE 10/30/2018 1530   Sepsis Labs Invalid input(s): PROCALCITONIN,  WBC,   LACTICIDVEN Microbiology No results found for this or any previous visit (from the past 240 hour(s)).   Time coordinating discharge: Over 30 minutes  SIGNED:   [name], MD  [facility] 10/16/2020, 11:44 AM Pager   If 7PM-7AM, please contact night-coverage [email or web address] Password [email or web address]

## 2020-10-17 NOTE — Progress Notes (Signed)
[facility] PROGRESS NOTE    Progress Note  [name]  XTK:[id number] DOB: [birth year] DOA: 10/01/2020 PCP: [name], NP     Brief Narrative:   Kristy Cabrera is an 70 y.o. female past medical his history significant for advanced dementia, status post resection for meningioma, seizure disorder unspecified left MCA stroke status post left sided endarterectomy in 2020 enrolled in hospice comes in with a new onset of left facial droop and left-sided weakness with slurred speech, CT of the head showed an acute CVA at the right insula and operculum. Neurology was consulted and work-up is being completed, physical therapy recommended skilled but was denied therefore TOC working on safe disposition.  Patient is medically stable to be transferred to skilled nursing facility.   Assessment/Plan:   Acute right-sided stroke/right MCA territory: Patient her tolerating his diet.   Patient was denied to skilled nursing facility awaiting TOC placement. Patient is medically stable to be transferred to skilled nursing.  Advanced dementia: Continue Aricept.  Seizure disorder: Continue oral Keppra.  Hyperlipidemia: Continue statins.  DVT prophylaxis: lovexno Family Communication:none Status is: Inpatient  Remains inpatient appropriate because:Hemodynamically unstable   Dispo: The patient is from: Home              Anticipated d/c is to: SNF              Patient currently is medically stable to d/c.   Difficult to place patient Yes        Code Status:     Code Status Orders  (From admission, onward)         Start     Ordered   10/01/20 1406  Do not attempt resuscitation (DNR)  Continuous       Question Answer Comment  In the event of cardiac or respiratory ARREST Do not call a "code blue"   In the event of cardiac or respiratory ARREST Do not perform Intubation, CPR, defibrillation or ACLS   In the event of cardiac or respiratory ARREST Use medication by  any route, position, wound care, and other measures to relive pain and suffering. May use oxygen, suction and manual treatment of airway obstruction as needed for comfort.      10/01/20 1407        Code Status History    Date Active Date Inactive Code Status Order ID Comments User Context   10/30/2018 1854 11/02/2018 1935 Full Code [id number]  Sid Falcon, MD Inpatient   10/24/2018 1450 10/29/2018 1951 Full Code [id number]  [name], MD Inpatient   07/04/2016 1621 07/06/2016 1807 Full Code [id number]  [name], MD Inpatient   Advance Care Planning Activity    Advance Directive Documentation   Flowsheet Row Most Recent Value  Type of Advance Directive Out of facility DNR (pink MOST or yellow form)  Pre-existing out of facility DNR order (yellow form or pink MOST form) --  "MOST" Form in Place? --        IV Access:    Peripheral IV   Procedures and diagnostic studies:   No results found.   Medical Consultants:    None.   Subjective:    Kristy Cabrera sleepy sleepy this morning not very talkative  Objective:    Vitals:   10/15/20 2048 10/16/20 0342 10/16/20 1113 10/16/20 2031  BP: (!) 155/84 (!) 144/52 (!) 122/56 (!) 133/94  Pulse: 74 62 79 78  Resp: 18 20 20 18   Temp: 98 F (36.7 C) 97.6  F (36.4 C) 98.3 F (36.8 C) 97.6 F (36.4 C)  TempSrc: Oral  Oral Oral  SpO2: 98% 93% 93% 94%  Weight:      Height:       SpO2: 94 %   Intake/Output Summary (Last 24 hours) at 10/17/2020 1330 Last data filed at 10/17/2020 1610 Gross per 24 hour  Intake --  Output 300 ml  Net -300 ml   Filed Weights   10/01/20 1338  Weight: 64 kg    Exam: General exam: In no acute distress. Respiratory system: Good air movement and clear to auscultation. Cardiovascular system: S1 & S2 heard, RRR. No JVD. Gastrointestinal system: Abdomen is nondistended, soft and nontender.  Extremities: No pedal edema. Skin: No rashes, lesions or ulcers   Data Reviewed:     Labs: Basic Metabolic Panel: No results for input(s): NA, K, CL, CO2, GLUCOSE, BUN, CREATININE, CALCIUM, MG, PHOS in the last 168 hours. GFR Estimated Creatinine Clearance: 56.5 mL/min (by C-G formula based on SCr of 0.78 mg/dL). Liver Function Tests: No results for input(s): AST, ALT, ALKPHOS, BILITOT, PROT, ALBUMIN in the last 168 hours. No results for input(s): LIPASE, AMYLASE in the last 168 hours. No results for input(s): AMMONIA in the last 168 hours. Coagulation profile No results for input(s): INR, PROTIME in the last 168 hours. COVID-19 Labs  No results for input(s): DDIMER, FERRITIN, LDH, CRP in the last 72 hours.  Lab Results  Component Value Date   [place] NEGATIVE 10/01/2020    CBC: No results for input(s): WBC, NEUTROABS, HGB, HCT, MCV, PLT in the last 168 hours. Cardiac Enzymes: No results for input(s): CKTOTAL, CKMB, CKMBINDEX, TROPONINI in the last 168 hours. BNP (last 3 results) No results for input(s): PROBNP in the last 8760 hours. CBG: Recent Labs  Lab 10/16/20 1607  GLUCAP 146*   D-Dimer: No results for input(s): DDIMER in the last 72 hours. Hgb A1c: No results for input(s): HGBA1C in the last 72 hours. Lipid Profile: No results for input(s): CHOL, HDL, LDLCALC, TRIG, CHOLHDL, LDLDIRECT in the last 72 hours. Thyroid function studies: No results for input(s): TSH, T4TOTAL, T3FREE, THYROIDAB in the last 72 hours.  Invalid input(s): FREET3 Anemia work up: No results for input(s): VITAMINB12, FOLATE, FERRITIN, TIBC, IRON, RETICCTPCT in the last 72 hours. Sepsis Labs: No results for input(s): PROCALCITON, WBC, LATICACIDVEN in the last 168 hours. Microbiology No results found for this or any previous visit (from the past 240 hour(s)).   Medications:   . aspirin EC  81 mg Oral Daily  . atorvastatin  80 mg Oral q1800  . clopidogrel  75 mg Oral Daily  . donepezil  5 mg Oral QHS  . enoxaparin (LOVENOX) injection  40 mg Subcutaneous Q24H   . levETIRAcetam  750 mg Oral BID  . melatonin  3 mg Oral QHS  . multivitamin with minerals  1 tablet Oral Daily  . sodium chloride flush  3 mL Intravenous Once   Continuous Infusions:    LOS: 16 days   [name]  [facility]  10/17/2020, 1:30 PM

## 2020-10-17 NOTE — Care Management Important Message (Signed)
Important Message  Patient Details  Name: Kristy Cabrera MRN: [id number] Date of Birth: [birth year]   Medicare Important Message Given:  Yes     Yachet Mattson 10/17/2020, 9:22 AM

## 2020-10-17 NOTE — TOC Progression Note (Signed)
Transition of Care [facility]) - Progression Note    Patient Details  Name: Kristy Cabrera MRN: [id number] Date of Birth: 1951/01/03  Transition of [facility]) CM/SW [place], RN Phone Number: [phone]  10/17/2020, 1:38 PM  Clinical Narrative:   TOC following for LTC placement. Per financial counselors notes, medicaid application is pending in [place] with Lewie Loron [phone] as of 3/25. CM has reached out to [facility] supervisor to determine if TOC is able to assist in providing LOG assistance. TOC will continue to follow.        Expected Discharge Plan and Services           Expected Discharge Date: 10/16/20                                     Social Determinants of Health (SDOH) Interventions    Readmission Risk Interventions Readmission Risk Prevention Plan 10/29/2018  Post Dischage Appt Complete  Medication Screening Complete  Transportation Screening Complete  Some recent data might be hidden

## 2020-10-17 NOTE — Progress Notes (Signed)
Nutrition Follow-up  DOCUMENTATION CODES:   Non-severe (moderate) malnutrition in context of chronic illness  INTERVENTION:  -d/c Ensure, not proper consistency -Continue Vital Cuisine Shake po BID, each supplement provides 520 kcal and 22 grams of protein -Continue Mighty Shake po daily, each supplement provides 330 kcal and 9 grams of protein -Continue Magic cup TID with meals, each supplement provides 290 kcal and 9 grams of protein -Continue MVI with minerals daily  NUTRITION DIAGNOSIS:  Moderate Malnutrition related to chronic illness (advanced dementia; s/p R MCA in 2020, declined since) as evidenced by moderate muscle depletion,mild muscle depletion,mild fat depletion. - ongoing  GOAL:  Patient will meet greater than or equal to 90% of their needs - progressing  MONITOR:  PO intake,Supplement acceptance  REASON FOR ASSESSMENT:  Consult Assessment of nutrition requirement/status  ASSESSMENT:  70 yo female with a PMH of advanced dementia, HTN, meningioma s/p resection, seizure, left MCA stroke s/p L-sided endarterectomy in 2020, and HLD who presents with acute R-sided stroke.  3/14 dysphagia 1 diet with thins ordered 3/15 diet advanced to regular with thin liquids 3/21 diet downgraded to regular with nectar thick liquids   Discussed pt with RN.   Per MD, pt continues to be medically stable for discharge, though placement remains difficult.   Pt's appetite has been good since last RD assessment -- 7 meals documented with 50-80% meal completion (69% average meal intake). Pt with orders for Ensure TID that were supposed to be discontinued due to being the incorrect consistency (can be thickened, but it is more labor intensive on staffing). Pt also receiving pre-thickened supplements given need for nectar thick consistency. Recommend continuation of pre-thickened supplements and discontinuation of Ensure.   UOP: 358ml documented x24 hours  Labs and medications reviewed.    Diet Order:   Diet Order            Diet - low sodium heart healthy           Diet regular Room service appropriate? No; Fluid consistency: Nectar Thick  Diet effective now                 EDUCATION NEEDS:   No education needs have been identified at this time  Skin:  Skin Assessment: Reviewed RN Assessment  Last BM:  3/26  Height:   Ht Readings from Last 1 Encounters:  10/01/20 5\' 4"  (1.626 m)    Weight:   Wt Readings from Last 1 Encounters:  10/01/20 64 kg    Ideal Body Weight:  54.5 kg  BMI:  Body mass index is 24.2 kg/m.  Estimated Nutritional Needs:   Kcal:  1600-1800  Protein:  80-95 grams  Fluid:  >1.6 L    Larkin Ina, MS, RD, LDN RD pager number and weekend/on-call pager number located in [place].

## 2020-10-18 DIAGNOSIS — I639 Cerebral infarction, unspecified: Secondary | ICD-10-CM

## 2020-10-18 NOTE — Progress Notes (Signed)
Physical Therapy Treatment Patient Details Name: Kristy Cabrera MRN: [id number] DOB: [birth year] Today's Date: 10/18/2020    History of Present Illness 70 y.o. female presented 3/13 with new onset of left facial droop, left-sided weakness and slurred speech.  CT of the head showed acute stroke on right insula and operculum PMH:  advanced dementia, HTN, meningioma s/p resection, seizure, unspecified left MCA stroke s/p left-sided endarterectomy in 2020, HLD, currently enrolled in home hospice    PT Comments    Pt admitted with above diagnosis. Pt was able to ambulate in room very slowly and shuffle gait with constant verbal cues and tactile assist. Pt takes incr time to ambulate short distances.  Pt may be close to baseline.   Pt currently with functional limitations due to balance and endurance deficits. Pt will benefit from skilled PT to increase their independence and safety with mobility to allow discharge to the venue listed below.    Follow Up Recommendations  SNF;Supervision/Assistance - 24 hour     Equipment Recommendations  None recommended by PT    Recommendations for Other Services       Precautions / Restrictions Precautions Precautions: Fall Precaution Comments: advanced dementia Restrictions Weight Bearing Restrictions: No    Mobility  Bed Mobility Overal bed mobility: Needs Assistance   Rolling: Min assist   Supine to sit: Min assist     General bed mobility comments: Min A for rolling, tactile/verbal cueing for handplacement on rail, min A to come into sitting and scoot forward to EOB    Transfers Overall transfer level: Needs assistance Equipment used: Rolling walker (2 wheeled) Transfers: Sit to/from [facility] Sit to Stand: Min assist Stand pivot transfers: Min assist       General transfer comment: Repeated cueing to come into stand, pt with poor handplacement pulling on RW, min A for steadying and navigating RW to  chair  Ambulation/Gait Ambulation/Gait assistance: Min assist Gait Distance (Feet): 28 Feet Assistive device: Rolling walker (2 wheeled) Gait Pattern/deviations: Shuffle;Trunk flexed Gait velocity: decreased Gait velocity interpretation: <1.31 ft/sec, indicative of household ambulator General Gait Details: Pt very slow gait to walk 28 feet, took incr time and pt needs constant cues.  Needs assist to weight shift as well.   Stairs             Wheelchair Mobility    Modified Rankin (Stroke Patients Only) Modified Rankin (Stroke Patients Only) Pre-Morbid Rankin Score: Moderate disability Modified Rankin: Moderately severe disability     Balance Overall balance assessment: Needs assistance Sitting-balance support: Feet supported;No upper extremity supported Sitting balance-Leahy Scale: Fair Sitting balance - Comments: EOB and in chair without UE support Postural control: Right lateral lean;Posterior lean Standing balance support: Bilateral upper extremity supported Standing balance-Leahy Scale: Poor Standing balance comment: Reliant on B UE support and external assistance               High Level Balance Comments: Difficulty with backward walking            Cognition Arousal/Alertness: Awake/alert Behavior During Therapy: WFL for tasks assessed/performed Overall Cognitive Status: No family/caregiver present to determine baseline cognitive functioning                                 General Comments: pt following one step commands with increased time and increased/repeated cueing. Was able to state "I'm doing okay" when asked "how are you"  Exercises General Exercises - Lower Extremity Long Arc Quad: AROM;Both;10 reps;Seated Hip Flexion/Marching: AROM;Both;10 reps;Seated    General Comments        Pertinent Vitals/Pain Pain Assessment: No/denies pain    Home Living                      Prior Function            PT  Goals (current goals can now be found in the care plan section) Progress towards PT goals: Progressing toward goals    Frequency    Min 3X/week      PT Plan Current plan remains appropriate    Co-evaluation              AM-PAC PT "6 Clicks" Mobility   Outcome Measure  Help needed turning from your back to your side while in a flat bed without using bedrails?: A Little Help needed moving from lying on your back to sitting on the side of a flat bed without using bedrails?: A Little Help needed moving to and from a bed to a chair (including a wheelchair)?: A Little Help needed standing up from a chair using your arms (e.g., wheelchair or bedside chair)?: A Little Help needed to walk in hospital room?: A Little Help needed climbing 3-5 steps with a railing? : Total 6 Click Score: 16    End of Session Equipment Utilized During Treatment: Gait belt Activity Tolerance: Patient tolerated treatment well Patient left: in chair;with call bell/phone within reach;with chair alarm set Nurse Communication: Mobility status PT Visit Diagnosis: Muscle weakness (generalized) (M62.81);Difficulty in walking, not elsewhere classified (R26.2);Other abnormalities of gait and mobility (R26.89)     Time: 7262-0355 PT Time Calculation (min) (ACUTE ONLY): 18 min  Charges:  $Gait Training: 8-22 mins                     Lilley Hubble M,PT Acute Rehab Services [phone] (pager)   [name] 10/18/2020, 2:23 PM

## 2020-10-18 NOTE — Progress Notes (Signed)
PROGRESS NOTE    Kristy Cabrera  YWV:[id number] DOB: 1951/03/02 DOA: 10/01/2020 PCP: [name], NP   Brief Narrative:  70 y.o. female with medical history significant of advanced dementia, HTN, meningioma s/p resection, seizure, unspecified left MCA stroke s/p left-sided endarterectomy in 2020, HLD, currently enrolled in home hospice presented with new onset of left facial droop, left-sided weakness and slurred speech.  On presentation code stroke was called.  CT of the head showed acute stroke on right insula and operculum.  Neurology was consulted.  PT recommended SNF  -Medically stable for discharge, awaiting safe discharge   Assessment & Plan:   Acute right-sided stroke, right MCA territory -Continues to have slurred speech and left-sided facial droop. CT of the head was showing acute stroke in right insula and operculum.  MRI of brain showed multiple small acute right MCA territory infarcts.  CTA head showed new proximal right M2 occlusion with distal reconstitution with severe intracranial atherosclerosis and unchanged 50% mid right common carotid artery stenosis and patent left carotid endarterectomy without recurrent stenosis -Aspirin statin Plavix per neurology - Echo shows EF of 55 to 60% with grade 1 diastolic dysfunction. -A1c 5.7 -LDL 153 and cholesterol 220 -PT/OT - SNF recommended, difficult to place, TOC following   Advanced dementia -Continue Aricept. -Fall precautions.  Delirium precautions.   Seizure disorder -Continue p.o. Keppra   Hyperlipidemia -Continue statin  Moderate protein calorie malnutrition -Continue supplements  DVT prophylaxis: Lovenox Code Status: Full Code Family Communication: No family at bedside  Status is: Inpatient  Remains inpatient appropriate because:Unsafe d/c plan   Dispo: The patient is from: Home              Anticipated d/c is to: TBD              Patient currently is medically stable to d/c.  Awaiting safe dispo    Difficult to place patient No    Body mass index is 24.2 kg/m.   Subjective:  Sitting up in bed, pleasantly confused,  Examination:  General: Awake alert oriented to self only, no distress, significant cognitive deficits CVS: S1-S2, regular rate rhythm Lungs: Decreased breath sounds bases otherwise clear Abdomen: Soft, nontender, bowel sounds present Extremities: No edema Neurologic: left sided facial droop, slurred speech. Left sided weakness.  Psychiatric: Poor judgment and insight.  Awake and alert   Objective: Vitals:   10/16/20 1113 10/16/20 2031 10/17/20 2136 10/18/20 0403  BP: (!) 122/56 (!) 133/94 (!) 155/64 (!) 154/82  Pulse: 79 78 67 66  Resp: 20 18 20    Temp: 98.3 F (36.8 C) 97.6 F (36.4 C) 98.2 F (36.8 C) 98.1 F (36.7 C)  TempSrc: Oral Oral Oral   SpO2: 93% 94% 96% 93%  Weight:      Height:       No intake or output data in the 24 hours ending 10/18/20 1106 Filed Weights   10/01/20 1338  Weight: 64 kg     Data Reviewed:   CBC: No results for input(s): WBC, NEUTROABS, HGB, HCT, MCV, PLT in the last 168 hours. Basic Metabolic Panel: No results for input(s): NA, K, CL, CO2, GLUCOSE, BUN, CREATININE, CALCIUM, MG, PHOS in the last 168 hours. GFR: Estimated Creatinine Clearance: 56.5 mL/min (by C-G formula based on SCr of 0.78 mg/dL). Liver Function Tests: No results for input(s): AST, ALT, ALKPHOS, BILITOT, PROT, ALBUMIN in the last 168 hours. No results for input(s): LIPASE, AMYLASE in the last 168 hours. No results for input(s):  AMMONIA in the last 168 hours. Coagulation Profile: No results for input(s): INR, PROTIME in the last 168 hours. Cardiac Enzymes: No results for input(s): CKTOTAL, CKMB, CKMBINDEX, TROPONINI in the last 168 hours. BNP (last 3 results) No results for input(s): PROBNP in the last 8760 hours. HbA1C: No results for input(s): HGBA1C in the last 72 hours. CBG: Recent Labs  Lab 10/16/20 1607  GLUCAP 146*   Lipid  Profile: No results for input(s): CHOL, HDL, LDLCALC, TRIG, CHOLHDL, LDLDIRECT in the last 72 hours. Thyroid Function Tests: No results for input(s): TSH, T4TOTAL, FREET4, T3FREE, THYROIDAB in the last 72 hours. Anemia Panel: No results for input(s): VITAMINB12, FOLATE, FERRITIN, TIBC, IRON, RETICCTPCT in the last 72 hours. Sepsis Labs: No results for input(s): PROCALCITON, LATICACIDVEN in the last 168 hours.  No results found for this or any previous visit (from the past 240 hour(s)).    Scheduled Meds: . aspirin EC  81 mg Oral Daily  . atorvastatin  80 mg Oral q1800  . clopidogrel  75 mg Oral Daily  . donepezil  5 mg Oral QHS  . enoxaparin (LOVENOX) injection  40 mg Subcutaneous Q24H  . levETIRAcetam  750 mg Oral BID  . melatonin  3 mg Oral QHS  . multivitamin with minerals  1 tablet Oral Daily  . sodium chloride flush  3 mL Intravenous Once   Continuous Infusions:   LOS: 17 days   Time spent= 15 mins    Domenic Polite, MD [facility]3/30/2022, 11:06 AM

## 2020-10-18 NOTE — Progress Notes (Signed)
  Speech Language Pathology Treatment: Dysphagia  Patient Details Name: Kristy Cabrera MRN: [id number] DOB: 06/03/1951 Today's Date: 10/18/2020 Time: 9675-9163 SLP Time Calculation (min) (ACUTE ONLY): 23.1 min  Assessment / Plan / Recommendation Clinical Impression  Pt was seen for treatment. She was alert and cooperative. Michaella, RN repoertred that the pt demonstrated frequent coughing with breakfast this morning. The severity of dysarthria has improved further and clarification requests were needed less frequently. Pt independently recalled compensatory strategies for speech intelligibility stating, "I need to talk louder and say each syllabus clear." Pt was able to use these strategies during structured speech tasks with 70% accuracy increasing to 100% with cues. Pt's husband was contacted via phone per pt's request so that the pt could speak with him. Pt was reminded to use compensatory strategies. Clarification was occasionally required by her husband, but speech intelligibility appeared adequate for >50% of questions/statements. Frequent coughing was noted at baseline and inconsistent coughing was also observed with the magic cup and nectar thick liquids. Pt stated that she did not want any foods from her lunch tray that was recently delivered. Pt did demonstrate some increased oral holding during this session. Pt's current diet will be continued at this time since pt's coughing is also noted in the absence of p.o. trials and is not reduced with modified consistencies. SLP will continue to follow pt.    HPI HPI: 70 y.o. female with medical history significant for advanced dementia, HTN, meningioma s/p resection, seizure, unspecified left MCA stroke s/p left-sided endarterectomy in 2020, HLD, currently enrolled in home hospice presented with new onset of left facial droop, left-sided weakness and slurred speech.  CT of the head showed acute stroke on right insula and operculum. PTA pt fed herself  a regular diet. MBSS 10/09/20 indicated intermittent aspiratin with thin liquids that was inconsistently sensed with diet recommendations of nectar thick and regular textures.      SLP Plan  Continue with current plan of care       Recommendations  Diet recommendations: Regular;Nectar-thick liquid Liquids provided via: Cup;Straw Medication Administration: Whole meds with puree Supervision: Staff to assist with self feeding;Full supervision/cueing for compensatory strategies Compensations: Slow rate;Small sips/bites Postural Changes and/or Swallow Maneuvers: Seated upright 90 degrees                Oral Care Recommendations: Oral care BID;Oral care prior to ice chip/H20 Follow up Recommendations: Skilled Nursing facility SLP Visit Diagnosis: Dysphagia, oropharyngeal phase (R13.12);Dysarthria and anarthria (R47.1) Plan: Continue with current plan of care       Torry Istre I. Hardin Negus, [place], [place] Office number [phone] Pager [place] 10/18/2020, 3:20 PM

## 2020-10-19 DIAGNOSIS — E440 Moderate protein-calorie malnutrition: Secondary | ICD-10-CM

## 2020-10-19 DIAGNOSIS — R5381 Other malaise: Secondary | ICD-10-CM

## 2020-10-19 NOTE — TOC Progression Note (Signed)
Transition of Care [facility]) - Progression Note    Patient Details  Name: Kristy Cabrera MRN: [id number] Date of Birth: 07-11-1951  Transition of Care [facility]) CM/SW [place], RN Phone Number: [phone]  10/19/2020, 11:14 AM  Clinical Narrative:    Per [place] TOC supervisor, patient is eligible for LOG. TOC should touch basis with the husband just to make sure he has no significant assets.        Expected Discharge Plan and Services           Expected Discharge Date: 10/16/20                                     Social Determinants of Health (SDOH) Interventions    Readmission Risk Interventions Readmission Risk Prevention Plan 10/29/2018  Post Dischage Appt Complete  Medication Screening Complete  Transportation Screening Complete  Some recent data might be hidden

## 2020-10-19 NOTE — Progress Notes (Signed)
[facility] PROGRESS NOTE  Kristy Cabrera HCW:[id number] DOB: 1951-05-26 DOA: 10/01/2020 PCP: [name], NP  Status: Remains inpatient appropriate because:Altered mental status, Unsafe d/c plan and Inpatient level of care appropriate due to severity of illness   Dispo: The patient is from: Home              Anticipated d/c is to: SNF; still lacks appropriate funding.  Medicaid application pending in [facility] with [name] [phone]; we also are waiting to determine if LOG assistance would be available.  This is likely facility dependent.              Patient currently is medically stable to d/c.   Difficult to place patient Yes   Level of care: Med-Surg  Code Status: DNR Family Communication: Patient only DVT prophylaxis: Lovenox Vaccination status: Unknown  Foley catheter: Purewick  HPI: 70 y.o.femalewith medical history significant ofadvanced dementia, HTN, meningioma s/p resection, seizure, unspecified left MCA stroke s/pleft-sided endarterectomy in 2020, HLD, currently enrolled in home hospice presented with new onset of left facial droop, left-sided weakness and slurred speech. On presentation code stroke was called. CT of the head showed acute stroke on right insula and operculum. Neurology was consulted.  PT recommended SNF   Subjective: .  Patient continues to have issues with dysarthria but is able to motion and nod appropriately.  Toward the end of our conversation she was able to whisper 1 audible and intelligible sentence.  Objective: Vitals:   10/18/20 0403 10/18/20 2001  BP: (!) 154/82 (!) 145/89  Pulse: 66 64  Resp:  18  Temp: 98.1 F (36.7 C) 98 F (36.7 C)  SpO2: 93% 95%    Intake/Output Summary (Last 24 hours) at 10/19/2020 0802 Last data filed at 10/18/2020 2059 Gross per 24 hour  Intake --  Output 600 ml  Net -600 ml   Filed Weights   10/01/20 1338  Weight: 64 kg    Exam: General: Awakened, pleasant, quite  interactive and in no acute distress Respiratory: clear to auscultation bilaterally, no wheezing, no crackles. Normal respiratory effort. No accessory muscle use.  Room air Cardiovascular: Regular rate and rhythm, no murmurs / rubs / gallops. No extremity edema. 2+ pedal pulses Abdomen: no tenderness, no masses palpated. No hepatosplenomegaly. Bowel sounds positive.  Has eaten about 50% of her breakfast tray.  States she has not finished yet and wants to complete her full breakfast tray. Neurologic: CN 2-12 grossly intact except for L facial droop- able to whisper some intelligible sentences although clearly affected by longstanding aphasia that developed during stroke that occurred prior to this hospitalization.  Left upper extremity strength about 1-2/5 with right upper extremity strength about 3-4/5.  Sensation intact. Psychiatric: Orientation difficult to accurately assess given dysarthria and aphasia but appears to be oriented x name and place.  Very interactive and pleasant.  Assessment/Plan: Acute problems: Acute right-sided stroke, right MCA territory -Continues to have slurred speech and left-sided facial droop.CT of the head was showing acute stroke in right insula and operculum. MRI of brain showed multiple small acute right MCA territory infarcts. CTA head showed new proximal right M2 occlusion with distal reconstitution with severe intracranial atherosclerosis and unchanged 50% mid right common carotid artery stenosis and patent left carotid endarterectomy without recurrent stenosis -Had stroke in April 2020 prior to carotid endarterectomy that resulted in severe aphasia which apparently had improved prior to current stroke. -Aspirin statin Plavix per neurology -Echo shows EF of 55 to 60%  with grade 1 diastolic dysfunction. -A1c 5.7 -LDL 153 and cholesterol 220 -PT/OT- SNF recommended, difficult to place, TOC following  Advanced dementia -Continue Aricept. -Fall precautions.   -Delirium precautions.  Moderate protein calorie malnutrition  Nutrition Problem: Moderate Malnutrition Etiology: chronic illness (advanced dementia; s/p R MCA in 2020, declined since) Signs/Symptoms: moderate muscle depletion,mild muscle depletion,mild fat depletion Interventions: Ensure Enlive (each supplement provides 350kcal and 20 grams of protein),Magic cup,MVI Estimated body mass index is 24.2 kg/m as calculated from the following:   Height as of this encounter: 5\' 4"  (1.626 m).   Weight as of this encounter: 64 kg. -Continue supplements    Other problems:  Seizure disorder -Continue p.o. Keppra  Hyperlipidemia -Continue statin   Data Reviewed: Basic Metabolic Panel: No results for input(s): NA, K, CL, CO2, GLUCOSE, BUN, CREATININE, CALCIUM, MG, PHOS in the last 168 hours. Liver Function Tests: No results for input(s): AST, ALT, ALKPHOS, BILITOT, PROT, ALBUMIN in the last 168 hours. No results for input(s): LIPASE, AMYLASE in the last 168 hours. No results for input(s): AMMONIA in the last 168 hours. CBC: No results for input(s): WBC, NEUTROABS, HGB, HCT, MCV, PLT in the last 168 hours. Cardiac Enzymes: No results for input(s): CKTOTAL, CKMB, CKMBINDEX, TROPONINI in the last 168 hours. BNP (last 3 results) No results for input(s): BNP in the last 8760 hours.  ProBNP (last 3 results) No results for input(s): PROBNP in the last 8760 hours.  CBG: Recent Labs  Lab 10/16/20 1607  GLUCAP 146*    No results found for this or any previous visit (from the past 240 hour(s)).   Studies: No results found.  Scheduled Meds: . aspirin EC  81 mg Oral Daily  . atorvastatin  80 mg Oral q1800  . clopidogrel  75 mg Oral Daily  . donepezil  5 mg Oral QHS  . enoxaparin (LOVENOX) injection  40 mg Subcutaneous Q24H  . levETIRAcetam  750 mg Oral BID  . melatonin  3 mg Oral QHS  . multivitamin with minerals  1 tablet Oral Daily  . sodium chloride flush  3 mL  Intravenous Once   Continuous Infusions:  Active Problems:   COPD (chronic obstructive pulmonary disease) (HCC)   Acute ischemic stroke (HCC)   Stroke (cerebrum) (HCC)   Malnutrition of moderate degree   Hospice care patient   Consultants:  Neurology  Palliative medicine  Procedures:  2D echocardiogram  Antibiotics:   Anti-infectives (From admission, onward)   None        Time spent: 35 minutes    [name] ANP  [facility] 7 am - 330 pm/M-F for direct patient care and secure chat Please refer to Amion for contact info 18  days

## 2020-10-19 NOTE — Progress Notes (Signed)
Occupational Therapy Treatment Patient Details Name: Kristy Cabrera MRN: [id number] DOB: Jul 04, 1951 Today's Date: 10/19/2020    History of present illness 70 y.o. female presented 3/13 with new onset of left facial droop, left-sided weakness and slurred speech.  CT of the head showed acute stroke on right insula and operculum PMH:  advanced dementia, HTN, meningioma s/p resection, seizure, unspecified left MCA stroke s/p left-sided endarterectomy in 2020, HLD, currently enrolled in home hospice   OT comments  Pt.was able to follow1 step directions 80 percent.Pt.did well feeding herself at S with cues to increase intake.Pt. was Min A with bed mobility and sit to stand transfers. Pt. Min A with stand pivot transfer with rw. Pt. Needs more help  Than husband can provide. Still recommending SNF. Acute OT to follow. Pt. Goals updated.   Follow Up Recommendations  SNF;Supervision/Assistance - 24 hour (Pt. needs SNF placement secondary to husband being disable but insurance has denied placement.)    Equipment Recommendations  3 in 1 bedside commode    Recommendations for Other Services      Precautions / Restrictions Precautions Precautions: Fall Precaution Comments: advanced dementia Restrictions Weight Bearing Restrictions: No       Mobility Bed Mobility Overal bed mobility: Needs Assistance Bed Mobility: Rolling;Supine to Sit Rolling: Min assist   Supine to sit: Min assist          Transfers Overall transfer level: Needs assistance Equipment used: Rolling walker (2 wheeled)   Sit to Stand: Min assist Stand pivot transfers: Min assist       General transfer comment: needs cues for proper hand placement    Balance     Sitting balance-Leahy Scale: Fair                                     ADL either performed or assessed with clinical judgement   ADL Overall ADL's : Needs assistance/impaired Eating/Feeding: Set up;Sitting   Grooming: Wash/dry  hands;Wash/dry face;Set up;Sitting                               Functional mobility during ADLs: Minimal assistance;Rolling walker General ADL Comments: Pt. has  improved performance with self feeding task.     Vision   Vision Assessment?: No apparent visual deficits   Perception     Praxis      Cognition Arousal/Alertness: Awake/alert Behavior During Therapy: WFL for tasks assessed/performed Overall Cognitive Status: No family/caregiver present to determine baseline cognitive functioning                                          Exercises     Shoulder Instructions       General Comments      Pertinent Vitals/ Pain       Pain Assessment: No/denies pain Pain Score: 0-No pain  Home Living                                          Prior Functioning/Environment              Frequency  Min 2X/week        Progress Toward Goals  OT Goals(current goals can now be found in the care plan section)  Progress towards OT goals: Progressing toward goals  Acute Rehab OT Goals Patient Stated Goal: none stated OT Goal Formulation: Patient unable to participate in goal setting Time For Goal Achievement: 10/30/20 Potential to Achieve Goals: Good ADL Goals Pt Will Perform Eating: with modified independence;sitting Pt Will Perform Grooming: with supervision;standing;with set-up Pt Will Transfer to Toilet: with supervision;bedside commode;ambulating Additional ADL Goal #1: Pt. to follow 1  step directions  100 percent of time  Plan Discharge plan remains appropriate    Co-evaluation                 AM-PAC OT "6 Clicks" Daily Activity     Outcome Measure   Help from another person eating meals?: A Little Help from another person taking care of personal grooming?: A Lot Help from another person toileting, which includes using toliet, bedpan, or urinal?: A Lot Help from another person bathing (including washing,  rinsing, drying)?: A Lot Help from another person to put on and taking off regular upper body clothing?: A Lot Help from another person to put on and taking off regular lower body clothing?: A Lot 6 Click Score: 13    End of Session Equipment Utilized During Treatment: Rolling walker  OT Visit Diagnosis: Unsteadiness on feet (R26.81);Muscle weakness (generalized) (M62.81);Cognitive communication deficit (R41.841)   Activity Tolerance Patient tolerated treatment well   Patient Left in bed;with call bell/phone within reach;with bed alarm set   Nurse Communication  (ok therapy)      Reece Packer OT/L   [place] 10/19/2020, 9:22 AM

## 2020-10-20 ENCOUNTER — Inpatient Hospital Stay (HOSPITAL_COMMUNITY)

## 2020-10-20 NOTE — Progress Notes (Signed)
[facility] PROGRESS NOTE  Kristy Cabrera QVZ:[id number] DOB: [birth year] DOA: 10/01/2020 PCP: [name], NP  Status: Remains inpatient appropriate because:Altered mental status, Unsafe d/c plan and Inpatient level of care appropriate due to severity of illness   Dispo: The patient is from: Home              Anticipated d/c is to: SNF; still lacks appropriate funding.  Medicaid application pending in [place] with [name] [phone]; we also are waiting to determine if LOG assistance would be available.  This is likely facility dependent.              Patient currently is medically stable to d/c.   Difficult to place patient Yes   Level of care: Med-Surg  Code Status: DNR Family Communication: Patient only DVT prophylaxis: Lovenox Vaccination status: Unknown  Foley catheter: Purewick  HPI: 70 y.o.femalewith medical history significant ofadvanced dementia, HTN, meningioma s/p resection, seizure, unspecified left MCA stroke s/pleft-sided endarterectomy in 2020, HLD, currently enrolled in home hospice presented with new onset of left facial droop, left-sided weakness and slurred speech. On presentation code stroke was called. CT of the head showed acute stroke on right insula and operculum. Neurology was consulted.  PT recommended SNF   Subjective: Awake-trying to eat breakfast but having difficulty eating-nodded vigorously when I asked if she like her egg,potatoes and bacon fixed as a snadwick  Objective: Vitals:   10/19/20 0828 10/19/20 2203  BP: (!) 147/68 (!) 160/78  Pulse: (!) 51 76  Resp: 18 20  Temp: 98 F (36.7 C) 98.2 F (36.8 C)  SpO2: 97% 98%    Intake/Output Summary (Last 24 hours) at 10/20/2020 6063 Last data filed at 10/20/2020 0000 Gross per 24 hour  Intake 600 ml  Output 700 ml  Net -100 ml   Filed Weights   10/01/20 1338  Weight: 64 kg    Exam: General: Calm, no acute distress Respiratory: Lungs CTA, RA Cardiovascular:  S1S2, regular, no peripheral edema Abdomen: soft, NT, BS+ LBM 3/31 Neurologic: CN 2-12 grossly intact except for L facial droop- able to whisper some intelligible sentences although clearly affected by longstanding aphasia that developed during stroke that occurred prior to this hospitalization.  Left upper extremity strength about 1-2/5 with right upper extremity strength about 3-4/5.  Sensation intact. Psychiatric: Awake, non verbal 2/2 aphasia, nods appropriately to questions asked  Assessment/Plan: Acute problems: Acute right-sided stroke, right MCA territory -Continues to have slurred speech and left-sided facial droop.CT of the head was showing acute stroke in right insula and operculum. MRI of brain showed multiple small acute right MCA territory infarcts. CTA head showed new proximal right M2 occlusion with distal reconstitution with severe intracranial atherosclerosis and unchanged 50% mid right common carotid artery stenosis and patent left carotid endarterectomy without recurrent stenosis -Had stroke in April 2020 prior to carotid endarterectomy that resulted in severe aphasia which apparently had improved prior to current stroke. -Aspirin statin Plavix per neurology -Echo shows EF of 55 to 60% with grade 1 diastolic dysfunction. -A1c 5.7 -LDL 153 and cholesterol 220 -PT/OT- SNF recommended, difficult to place, TOC following  Advanced dementia -Continue Aricept. -Fall precautions.  -Delirium precautions.  Moderate protein calorie malnutrition  Nutrition Problem: Moderate Malnutrition Etiology: chronic illness (advanced dementia; s/p R MCA in 2020, declined since) Signs/Symptoms: moderate muscle depletion,mild muscle depletion,mild fat depletion Interventions: Ensure Enlive (each supplement provides 350kcal and 20 grams of protein),Magic cup,MVI Estimated body mass index is 24.2 kg/m as calculated  from the following:   Height as of this encounter: 5\' 4"  (1.626 m).    Weight as of this encounter: 64 kg. -Continue supplements -unit secretary updated to order finger foods and breakfast sandwiches to allow for more independent eating    Other problems:  Seizure disorder -Continue p.o. Keppra  Hyperlipidemia -Continue statin   Data Reviewed: Basic Metabolic Panel: No results for input(s): NA, K, CL, CO2, GLUCOSE, BUN, CREATININE, CALCIUM, MG, PHOS in the last 168 hours. Liver Function Tests: No results for input(s): AST, ALT, ALKPHOS, BILITOT, PROT, ALBUMIN in the last 168 hours. No results for input(s): LIPASE, AMYLASE in the last 168 hours. No results for input(s): AMMONIA in the last 168 hours. CBC: No results for input(s): WBC, NEUTROABS, HGB, HCT, MCV, PLT in the last 168 hours. Cardiac Enzymes: No results for input(s): CKTOTAL, CKMB, CKMBINDEX, TROPONINI in the last 168 hours. BNP (last 3 results) No results for input(s): BNP in the last 8760 hours.  ProBNP (last 3 results) No results for input(s): PROBNP in the last 8760 hours.  CBG: Recent Labs  Lab 10/16/20 1607  GLUCAP 146*    No results found for this or any previous visit (from the past 240 hour(s)).   Studies: No results found.  Scheduled Meds: . aspirin EC  81 mg Oral Daily  . atorvastatin  80 mg Oral q1800  . clopidogrel  75 mg Oral Daily  . donepezil  5 mg Oral QHS  . enoxaparin (LOVENOX) injection  40 mg Subcutaneous Q24H  . levETIRAcetam  750 mg Oral BID  . melatonin  3 mg Oral QHS  . multivitamin with minerals  1 tablet Oral Daily  . sodium chloride flush  3 mL Intravenous Once   Continuous Infusions:  Active Problems:   COPD (chronic obstructive pulmonary disease) (HCC)   Acute ischemic stroke (HCC)   Stroke (cerebrum) (HCC)   Malnutrition of moderate degree   Hospice care patient   Physical deconditioning   Consultants:  Neurology  Palliative medicine  Procedures:  2D echocardiogram  Antibiotics:   Anti-infectives (From admission,  onward)   None       Time spent: 20 minutes    [name] ANP  [facility] 7 am - 330 pm/M-F for direct patient care and secure chat Please refer to Amion for contact info 19  days

## 2020-10-20 NOTE — Progress Notes (Addendum)
1:30pm: CSW spoke with [place] at [facility] - the most recent request for information was sent to patient's husband on 10/13/20.   CSW spoke with patient's husband [name] to encourage him to send information back to DSS as soon as possible. CSW encouraged [name] to reach out to [facility] if additional needs or questions arise - he is agreeable.  11am: CSW received handoff from unit CSW - patient's SNF authorization was denied by [employer] due to being at her baseline. Patient will need LTC care and her Medicaid application is pending.  Madilyn Fireman, MSW, LCSW Transitions of Care  Clinical Social Worker II [phone]

## 2020-10-20 NOTE — Progress Notes (Signed)
Physical Therapy Treatment Patient Details Name: Kristy Cabrera MRN: [id number] DOB: [birth year] Today's Date: 10/20/2020    History of Present Illness 70 y.o. female presented 3/13 with new onset of left facial droop, left-sided weakness and slurred speech. CT of the head showed acute stroke on right insula and operculum PMH:  advanced dementia, HTN, meningioma s/p resection, seizure, unspecified left MCA stroke s/p left-sided endarterectomy in 2020, HLD, currently enrolled in home hospice    PT Comments    Pt received in supine, agreeable to therapy session and with good participation as able. Pt needed increased assist for bed mobility this date and seemed to indicate fear of falls (dysarthric/low speech at baseline and difficult to parse some words). Once seated, pt with fair balance and able to stand with minA on second attempt, then performed gait trial at bedside with modA, needing max cues and physical assist to manage RW and weight shift. Of note, pt with wet sounding cough during session, RN/MD notified. VSS on RA. Pt continues to benefit from PT services to progress toward functional mobility goals. Continue to recommend SNF.  Follow Up Recommendations  SNF;Supervision/Assistance - 24 hour     Equipment Recommendations  None recommended by PT    Recommendations for Other Services       Precautions / Restrictions Precautions Precautions: Fall Precaution Comments: advanced dementia Restrictions Weight Bearing Restrictions: No    Mobility  Bed Mobility Overal bed mobility: Needs Assistance Bed Mobility: Rolling;Sidelying to Sit;Sit to Sidelying Rolling: Min assist Sidelying to sit: Max assist     Sit to sidelying: Min assist General bed mobility comments: Min A for rolling, tactile/verbal cueing for hand placement on rail, maxA to come into sitting and scoot forward to EOB; minA to return to supine    Transfers Overall transfer level: Needs assistance Equipment used:  Rolling walker (2 wheeled)   Sit to Stand: Min assist         General transfer comment: needs cues for proper hand placement and x2 attempts to come to standing with use of momentum strategy  Ambulation/Gait Ambulation/Gait assistance: Mod assist Gait Distance (Feet): 12 Feet Assistive device: Rolling walker (2 wheeled) Gait Pattern/deviations: Shuffle;Trunk flexed Gait velocity: decreased   General Gait Details: Very slow, small shuffled steps near-festinating pattern, took increased time and pt needs constant cues. Needs assist to weight shift and for RW management   Stairs             Wheelchair Mobility    Modified Rankin (Stroke Patients Only)       Balance Overall balance assessment: Needs assistance Sitting-balance support: Feet supported;No upper extremity supported Sitting balance-Leahy Scale: Fair     Standing balance support: Bilateral upper extremity supported Standing balance-Leahy Scale: Poor Standing balance comment: Reliant on B UE support and external assistance                            Cognition Arousal/Alertness: Awake/alert Behavior During Therapy: WFL for tasks assessed/performed;Anxious Overall Cognitive Status: No family/caregiver present to determine baseline cognitive functioning                                 General Comments: pt following one step commands with increased time and increased/repeated cueing. pt minimally verbal but able to indicate fear of falling with bed mobility and fatigue level during gait.      Exercises General  Exercises - Lower Extremity Ankle Circles/Pumps: AROM;Both;10 reps;Seated Long Arc Quad: AROM;Both;10 reps;Seated Hip Flexion/Marching: AROM;Both;10 reps;Seated    General Comments General comments (skin integrity, edema, etc.): SpO2 98% on RA during mobility and HR WNL; BP not assessed      Pertinent Vitals/Pain Pain Assessment: No/denies pain Faces Pain Scale: Hurts  a little bit Pain Location: grimacing during bed mobility Pain Descriptors / Indicators: Grimacing Pain Intervention(s): Monitored during session;Repositioned    Home Living                      Prior Function            PT Goals (current goals can now be found in the care plan section) Acute Rehab PT Goals Patient Stated Goal: none stated PT Goal Formulation: Patient unable to participate in goal setting Time For Goal Achievement: 10/26/20 Potential to Achieve Goals: Fair Progress towards PT goals: Progressing toward goals    Frequency    Min 3X/week      PT Plan Current plan remains appropriate    Co-evaluation              AM-PAC PT "6 Clicks" Mobility   Outcome Measure  Help needed turning from your back to your side while in a flat bed without using bedrails?: A Little Help needed moving from lying on your back to sitting on the side of a flat bed without using bedrails?: A Lot Help needed moving to and from a bed to a chair (including a wheelchair)?: A Little Help needed standing up from a chair using your arms (e.g., wheelchair or bedside chair)?: A Little Help needed to walk in hospital room?: A Lot Help needed climbing 3-5 steps with a railing? : Total 6 Click Score: 14    End of Session Equipment Utilized During Treatment: Gait belt Activity Tolerance: Patient tolerated treatment well Patient left: in bed;with call bell/phone within reach;with bed alarm set (RN notified pt coughing frequently with wet sounding cough) Nurse Communication: Mobility status PT Visit Diagnosis: Muscle weakness (generalized) (M62.81);Difficulty in walking, not elsewhere classified (R26.2);Other abnormalities of gait and mobility (R26.89)     Time: 6945-0388 PT Time Calculation (min) (ACUTE ONLY): 24 min  Charges:  $Gait Training: 8-22 mins $Therapeutic Exercise: 8-22 mins                     Annleigh Knueppel P., PTA Acute Rehabilitation Services Pager:  [phone] Office: [facility] 10/20/2020, 5:03 PM

## 2020-10-20 NOTE — Progress Notes (Signed)
  Speech Language Pathology Treatment: Dysphagia;Cognitive-Linquistic  Patient Details Name: Kristy Cabrera MRN: [id number] DOB: 08-Dec-1950 Today's Date: 10/20/2020 Time: 8280-0349 SLP Time Calculation (min) (ACUTE ONLY): 22.32 min  Assessment / Plan / Recommendation Clinical Impression  Pt was seen for treatment with her husband present. Pt's husband was educated regarding the pt's performance, the nature of dysarthria and the compensatory strategies for speech intelligbility. Pt's husband verbalized understanding and he reported that her speech is notably different from her baseline, but that her vocal intensity has always been reduced. Per the husband, he typically has to stand very close to her to hear. Pt used compensatory strategies at the phrase level during spontaneous speech with intermittent cues for vocal intensity. Pt tolerated 4/5 boluses of thin liquids via cup without overt s/sx of aspiration and exhibited coughing with one bolus of banana. Oral holding continues to be noted and coughing appeared to correlated at least some times with prolonged oral holding. Pt's current diet will be continued. Full supervision is still recommended and will likely be beneficial to reduce length of oral holding. SLP will continue to follow pt.   HPI HPI: 70 y.o. female with medical history significant for advanced dementia, HTN, meningioma s/p resection, seizure, unspecified left MCA stroke s/p left-sided endarterectomy in 2020, HLD, currently enrolled in home hospice presented with new onset of left facial droop, left-sided weakness and slurred speech.  CT of the head showed acute stroke on right insula and operculum. PTA pt fed herself a regular diet. MBSS 10/09/20 indicated intermittent aspiratin with thin liquids that was inconsistently sensed with diet recommendations of nectar thick and regular textures.      SLP Plan  Continue with current plan of care       Recommendations  Diet  recommendations: Regular;Nectar-thick liquid Liquids provided via: Cup;Straw Medication Administration: Whole meds with puree Supervision: Staff to assist with self feeding;Full supervision/cueing for compensatory strategies Compensations: Slow rate;Small sips/bites Postural Changes and/or Swallow Maneuvers: Seated upright 90 degrees                Oral Care Recommendations: Oral care BID;Oral care prior to ice chip/H20 Follow up Recommendations: Skilled Nursing facility SLP Visit Diagnosis: Dysphagia, oropharyngeal phase (R13.12);Dysarthria and anarthria (R47.1) Plan: Continue with current plan of care       Yenifer Saccente I. Hardin Negus, [place], [place] Office number [phone] Pager [place] 10/20/2020, 2:31 PM

## 2020-10-21 DIAGNOSIS — I639 Cerebral infarction, unspecified: Secondary | ICD-10-CM

## 2020-10-21 LAB — COMPREHENSIVE METABOLIC PANEL
ALT: 25 U/L (ref 0–44)
AST: 24 U/L (ref 15–41)
Albumin: 3.2 g/dL — ABNORMAL LOW (ref 3.5–5.0)
Alkaline Phosphatase: 87 U/L (ref 38–126)
Anion gap: 7 (ref 5–15)
BUN: 29 mg/dL — ABNORMAL HIGH (ref 8–23)
CO2: 24 mmol/L (ref 22–32)
Calcium: 9.4 mg/dL (ref 8.9–10.3)
Chloride: 111 mmol/L (ref 98–111)
Creatinine, Ser: 0.93 mg/dL (ref 0.44–1.00)
GFR, Estimated: >60 mL/min (ref >60)
Glucose, Bld: 122 mg/dL — ABNORMAL HIGH (ref 70–99)
Potassium: 3.7 mmol/L (ref 3.5–5.1)
Sodium: 142 mmol/L (ref 135–145)
Total Bilirubin: 0.3 mg/dL (ref 0.3–1.2)
Total Protein: 6.4 g/dL — ABNORMAL LOW (ref 6.5–8.1)

## 2020-10-21 LAB — CBC WITH DIFFERENTIAL/PLATELET
Abs Immature Granulocytes: 0.04 10*3/uL (ref 0.00–0.07)
Basophils Absolute: 0.1 10*3/uL (ref 0.0–0.1)
Basophils Relative: 1 %
Eosinophils Absolute: 0.2 10*3/uL (ref 0.0–0.5)
Eosinophils Relative: 2 %
HCT: 39.3 % (ref 36.0–46.0)
Hemoglobin: 13 g/dL (ref 12.0–15.0)
Immature Granulocytes: 0 %
Lymphocytes Relative: 30 %
Lymphs Abs: 3.1 10*3/uL (ref 0.7–4.0)
MCH: 29.5 pg (ref 26.0–34.0)
MCHC: 33.1 g/dL (ref 30.0–36.0)
MCV: 89.3 fL (ref 80.0–100.0)
Monocytes Absolute: 0.8 10*3/uL (ref 0.1–1.0)
Monocytes Relative: 8 %
Neutro Abs: 6.2 10*3/uL (ref 1.7–7.7)
Neutrophils Relative %: 59 %
Platelets: 333 10*3/uL (ref 150–400)
RBC: 4.4 MIL/uL (ref 3.87–5.11)
RDW: 12.8 % (ref 11.5–15.5)
WBC: 10.5 10*3/uL (ref 4.0–10.5)
nRBC: 0 % (ref 0.0–0.2)

## 2020-10-21 NOTE — Progress Notes (Signed)
Patient seen and examined, 70 year old female with advanced dementia admitted with acute CVA, awaiting SNF, difficult placement -Remains pleasantly confused  Domenic Polite MD

## 2020-10-21 NOTE — Plan of Care (Signed)
  Problem: Education: Goal: Knowledge of General Education information will improve Description Including pain rating scale, medication(s)/side effects and non-pharmacologic comfort measures Outcome: Progressing   

## 2020-10-22 NOTE — Plan of Care (Signed)
   Problem: Education: Goal: Knowledge of General Education information will improve Description: Including pain rating scale, medication(s)/side effects and non-pharmacologic comfort measures 10/22/2020 1112 by Bennie Pierini I, LPN Outcome: Progressing

## 2020-10-22 NOTE — Progress Notes (Signed)
Patient seen and examined -Very pleasant 69/female with advanced dementia on hospice admitted with acute CVA -Remains stable awaiting SNF  Domenic Polite, MD

## 2020-10-22 NOTE — Plan of Care (Incomplete)
  Problem: Education: Goal: Knowledge of General Education information will improve Description Including pain rating scale, medication(s)/side effects and non-pharmacologic comfort measures Outcome: Progressing   

## 2020-10-23 NOTE — Progress Notes (Signed)
[facility] PROGRESS NOTE  Kristy Cabrera RCV:[id number] DOB: 02-Jul-1951 DOA: 10/01/2020 PCP: [name], NP  Status: Remains inpatient appropriate because:Altered mental status, Unsafe d/c plan and Inpatient level of care appropriate due to severity of illness   Dispo: The patient is from: Home              Anticipated d/c is to: SNF; still lacks appropriate funding.  Medicaid application pending in [facility] with [name] [phone]; we also are waiting to determine if LOG assistance would be available.  This is likely facility dependent.              Patient currently is medically stable to d/c.   Difficult to place patient Yes   Level of care: Med-Surg  Code Status: DNR Family Communication: Patient only DVT prophylaxis: Lovenox Vaccination status: Unknown  Foley catheter: Purewick  HPI: 70 y.o.femalewith medical history significant ofadvanced dementia, HTN, meningioma s/p resection, seizure, unspecified left MCA stroke s/pleft-sided endarterectomy in 2020, HLD, currently enrolled in home hospice presented with new onset of left facial droop, left-sided weakness and slurred speech. On presentation code stroke was called. CT of the head showed acute stroke on right insula and operculum. Neurology was consulted.  PT recommended SNF   Subjective: Awakened from sleep.  Alert.  When asked nodded and affirmation that she wanted to eat.  Offered to take her pancakes and sausage and make into sandwich form for ease of self administration of food.  She agreed.  Tray set up prior to exit from room  Objective: Vitals:   10/22/20 1413 10/22/20 2030  BP: (!) 150/80 (!) 158/88  Pulse: 75 87  Resp: 17 20  Temp: 98 F (36.7 C) 97.9 F (36.6 C)  SpO2: 92% 92%   No intake or output data in the 24 hours ending 10/23/20 0732 Filed Weights   10/01/20 1338  Weight: 64 kg    Exam: General: Awakened.  Calm, no acute distress Respiratory: Lung sounds are clear  anteriorly.  Stable on room air Cardiovascular: Normal heart sounds, pulse regular, normotensive, skin warm and dry Abdomen:  LBM 3/31, soft nontender nondistended.  Eats best with finger type foods and with set up of tray.  Normoactive bowel sounds Neurologic: CN 2-12 grossly intact except for L facial droop-continues with aphasia.  Left upper extremity strength about 1-2/5 with right upper extremity strength about 3-4/5.  Sensation intact. Psychiatric: Awake.  Interactive.  Able to communicate via nods with simple close ended questions.  Given aphasia very difficult to assess orientation but appears to be oriented at least to name.  Assessment/Plan: Acute problems: Acute right-sided stroke, right MCA territory -Continues to have slurred speech and left-sided facial droop.CT of the head was showing acute stroke in right insula and operculum. MRI of brain showed multiple small acute right MCA territory infarcts. CTA head showed new proximal right M2 occlusion with distal reconstitution with severe intracranial atherosclerosis and unchanged 50% mid right common carotid artery stenosis and patent left carotid endarterectomy without recurrent stenosis -Had stroke in April 2020 prior to carotid endarterectomy that resulted in severe aphasia which apparently had improved prior to current stroke. -Aspirin statin Plavix per neurology -Echo shows EF of 55 to 60% with grade 1 diastolic dysfunction. -A1c 5.7 -LDL 153 and cholesterol 220 -PT/OT- SNF recommended  Advanced dementia -Continue Aricept. -Fall precautions.  -Delirium precautions.  Moderate protein calorie malnutrition  Nutrition Problem: Moderate Malnutrition Etiology: chronic illness (advanced dementia; s/p R MCA in 2020, declined  since) Signs/Symptoms: moderate muscle depletion,mild muscle depletion,mild fat depletion Interventions: Ensure Enlive (each supplement provides 350kcal and 20 grams of protein),Magic cup,MVI Estimated  body mass index is 24.2 kg/m as calculated from the following:   Height as of this encounter: 5\' 4"  (1.626 m).   Weight as of this encounter: 64 kg. -Continue supplements -unit secretary updated to order finger foods and sandwiches to allow for more independent eating    Other problems:  Seizure disorder -Continue p.o. Keppra  Hyperlipidemia -Continue statin   Data Reviewed: Basic Metabolic Panel: Recent Labs  Lab 10/21/20 0206  NA 142  K 3.7  CL 111  CO2 24  GLUCOSE 122*  BUN 29*  CREATININE 0.93  CALCIUM 9.4   Liver Function Tests: Recent Labs  Lab 10/21/20 0206  AST 24  ALT 25  ALKPHOS 87  BILITOT 0.3  PROT 6.4*  ALBUMIN 3.2*   No results for input(s): LIPASE, AMYLASE in the last 168 hours. No results for input(s): AMMONIA in the last 168 hours. CBC: Recent Labs  Lab 10/21/20 0206  WBC 10.5  NEUTROABS 6.2  HGB 13.0  HCT 39.3  MCV 89.3  PLT 333   Cardiac Enzymes: No results for input(s): CKTOTAL, CKMB, CKMBINDEX, TROPONINI in the last 168 hours. BNP (last 3 results) No results for input(s): BNP in the last 8760 hours.  ProBNP (last 3 results) No results for input(s): PROBNP in the last 8760 hours.  CBG: Recent Labs  Lab 10/16/20 1607  GLUCAP 146*    No results found for this or any previous visit (from the past 240 hour(s)).   Studies: No results found.  Scheduled Meds: . aspirin EC  81 mg Oral Daily  . atorvastatin  80 mg Oral q1800  . clopidogrel  75 mg Oral Daily  . donepezil  5 mg Oral QHS  . enoxaparin (LOVENOX) injection  40 mg Subcutaneous Q24H  . levETIRAcetam  750 mg Oral BID  . melatonin  3 mg Oral QHS  . multivitamin with minerals  1 tablet Oral Daily  . sodium chloride flush  3 mL Intravenous Once   Continuous Infusions:  Active Problems:   COPD (chronic obstructive pulmonary disease) (HCC)   Acute ischemic stroke (HCC)   Stroke (cerebrum) (HCC)   Malnutrition of moderate degree   Hospice care patient    Physical deconditioning   Consultants:  Neurology  Palliative medicine  Procedures:  2D echocardiogram  Antibiotics:   Anti-infectives (From admission, onward)   None       Time spent: 20 minutes    [name] ANP  [facility] 7 am - 330 pm/M-F for direct patient care and secure chat Please refer to Amion for contact info 22  days

## 2020-10-23 NOTE — Progress Notes (Signed)
Nutrition Follow-up  DOCUMENTATION CODES:   Non-severe (moderate) malnutrition in context of chronic illness  INTERVENTION:  -d/c Vital Cuisine shakes -d/c Magic Cup -Double protein portions with meals -Increase to Mighty Shake po TID, each supplement provides 330 kcal and 9 grams of protein -Continue MVI with minerals daily  NUTRITION DIAGNOSIS:  Moderate Malnutrition related to chronic illness (advanced dementia; s/p R MCA in 2020, declined since) as evidenced by moderate muscle depletion,mild muscle depletion,mild fat depletion. - ongoing  GOAL:  Patient will meet greater than or equal to 90% of their needs - progressing  MONITOR:  PO intake,Supplement acceptance  REASON FOR ASSESSMENT:  Consult Assessment of nutrition requirement/status  ASSESSMENT:  70 yo female with a PMH of advanced dementia, HTN, meningioma s/p resection, seizure, left MCA stroke s/p L-sided endarterectomy in 2020, and HLD who presents with acute R-sided stroke.  3/14 dysphagia 1 diet with thins ordered 3/15 diet advanced to regular with thin liquids 3/21 diet downgraded to regular with nectar thick liquids   Per MD, pt continues to be medically stable for discharge and is awaiting placement.   Discussed pt with RN who reports pt is eating well, but reports pt is not enjoying/receiving the majority of her supplements. Pt has requested Magic Cup be discontinued, but RN states the [employer] and Vital Cuisine shakes have not been coming to the patient's room. RN feels pt will do best with vanilla mighty shake. Will adjust regimen.   PO Intake: 50-75% x 4 recorded meals since last RD assessment (56% average meal intake)  No UOP documented.   Labs and medications reviewed.   Diet Order:   Diet Order            Diet - low sodium heart healthy           Diet regular Room service appropriate? No; Fluid consistency: Nectar Thick  Diet effective now                 EDUCATION NEEDS:   No  education needs have been identified at this time  Skin:  Skin Assessment: Reviewed RN Assessment  Last BM:  10/19/20  Height:   Ht Readings from Last 1 Encounters:  10/01/20 5\' 4"  (1.626 m)    Weight:   Wt Readings from Last 1 Encounters:  10/01/20 64 kg    Ideal Body Weight:  54.5 kg  BMI:  Body mass index is 24.2 kg/m.  Estimated Nutritional Needs:   Kcal:  1600-1800  Protein:  80-95 grams  Fluid:  >1.6 L    Larkin Ina, MS, RD, LDN RD pager number and weekend/on-call pager number located in [place].

## 2020-10-23 NOTE — Progress Notes (Signed)
Physical Therapy Treatment Patient Details Name: Kristy Cabrera MRN: [id number] DOB: 12/02/1950 Today's Date: 10/23/2020    History of Present Illness 70 y.o. female presented 3/13 with new onset of left facial droop, left-sided weakness and slurred speech. CT of the head showed acute stroke on right insula and operculum PMH:  advanced dementia, HTN, meningioma s/p resection, seizure, unspecified left MCA stroke s/p left-sided endarterectomy in 2020, HLD, currently enrolled in home hospice    PT Comments    Pt admitted with above diagnosis. Pt was able to ambulate in room a short distance with shuffle gait with RW.  No real change from previous visit. Pt did have to stand a good bit to be cleaned as she had had a large BM in the bed and took incr time to clean pt.  Pt currently with functional limitations due to balance and endurance deficits. Pt will benefit from skilled PT to increase their independence and safety with mobility to allow discharge to the venue listed below.     Follow Up Recommendations  SNF;Supervision/Assistance - 24 hour     Equipment Recommendations  None recommended by PT    Recommendations for Other Services       Precautions / Restrictions Precautions Precautions: Fall Precaution Comments: advanced dementia    Mobility  Bed Mobility Overal bed mobility: Needs Assistance Bed Mobility: Rolling;Sidelying to Sit;Sit to Sidelying Rolling: Min assist Sidelying to sit: Max assist Supine to sit: Min assist Sit to supine: Mod assist;HOB elevated Sit to sidelying: Min assist General bed mobility comments: Min A for rolling, tactile/verbal cueing for hand placement on rail, maxA to come into sitting and scoot forward to EOB; noted large BM once pt to EOB.    Transfers Overall transfer level: Needs assistance Equipment used: Rolling walker (2 wheeled) Transfers: Sit to/from [facility] Sit to Stand: Min assist Stand pivot transfers: Min  assist       General transfer comment: needs cues for proper hand placement and x2 attempts to come to standing with use of momentum strategy.  Took incr time for pt to stand to be cleaned as she had large BM and appeared to still be going intiially.  Cleaned pt and changed all linens.  Pt is able to stand with min gaurd assist with RW to be cleaned.  Ambulation/Gait Ambulation/Gait assistance: Mod assist Gait Distance (Feet): 10 Feet Assistive device: Rolling walker (2 wheeled) Gait Pattern/deviations: Shuffle;Trunk flexed Gait velocity: decreased Gait velocity interpretation: <1.31 ft/sec, indicative of household ambulator General Gait Details: Very slow, small shuffled steps near-festinating pattern, took increased time and pt needs constant cues. Needs assist to weight shift and for RW management   Stairs             Wheelchair Mobility    Modified Rankin (Stroke Patients Only) Modified Rankin (Stroke Patients Only) Pre-Morbid Rankin Score: Moderate disability Modified Rankin: Moderately severe disability     Balance Overall balance assessment: Needs assistance Sitting-balance support: Feet supported;No upper extremity supported Sitting balance-Leahy Scale: Fair Sitting balance - Comments: EOB and in chair without UE support Postural control: Right lateral lean;Posterior lean Standing balance support: Bilateral upper extremity supported Standing balance-Leahy Scale: Poor Standing balance comment: Reliant on B UE support and external assistance               High Level Balance Comments: Difficulty with backward walking            Cognition Arousal/Alertness: Awake/alert Behavior During Therapy: WFL for tasks assessed/performed;Anxious  Overall Cognitive Status: No family/caregiver present to determine baseline cognitive functioning                                 General Comments: pt following one step commands with increased time and  increased/repeated cueing. pt minimally verbal but able to indicate fear of falling with bed mobility and fatigue level during gait.      Exercises General Exercises - Lower Extremity Ankle Circles/Pumps: AROM;Both;10 reps;Seated Long Arc Quad: AROM;Both;10 reps;Seated Other Exercises Other Exercises: sit to stands x3, standing tolerance    General Comments General comments (skin integrity, edema, etc.): VSS      Pertinent Vitals/Pain Pain Assessment: No/denies pain    Home Living                      Prior Function            PT Goals (current goals can now be found in the care plan section) Acute Rehab PT Goals Patient Stated Goal: none stated Progress towards PT goals: Progressing toward goals    Frequency    Min 3X/week      PT Plan Current plan remains appropriate    Co-evaluation              AM-PAC PT "6 Clicks" Mobility   Outcome Measure  Help needed turning from your back to your side while in a flat bed without using bedrails?: A Little Help needed moving from lying on your back to sitting on the side of a flat bed without using bedrails?: A Lot Help needed moving to and from a bed to a chair (including a wheelchair)?: A Little Help needed standing up from a chair using your arms (e.g., wheelchair or bedside chair)?: A Little Help needed to walk in hospital room?: A Lot Help needed climbing 3-5 steps with a railing? : Total 6 Click Score: 14    End of Session Equipment Utilized During Treatment: Gait belt Activity Tolerance: Patient tolerated treatment well Patient left: with call bell/phone within reach;in chair;with chair alarm set Nurse Communication: Mobility status PT Visit Diagnosis: Muscle weakness (generalized) (M62.81);Difficulty in walking, not elsewhere classified (R26.2);Other abnormalities of gait and mobility (R26.89)     Time: 3785-8850 PT Time Calculation (min) (ACUTE ONLY): 18 min  Charges:  $Gait Training: 8-22  mins                     Kristy Cabrera M,PT Acute Rehab Services [phone] (pager)   Kristy Cabrera 10/23/2020, 4:37 PM

## 2020-10-23 NOTE — TOC Progression Note (Signed)
Transition of Care [facility]) - Progression Note    Patient Details  Name: Kristy Cabrera MRN: [id number] Date of Birth: [birth year]  Transition of Care [facility]) CM/SW [place], RN Phone Number: 10/23/2020, 10:27 AM  Clinical Narrative:    Case management called and left a message with Arbie Cookey, [place] with [facility] in [place] and the facility has no available LTC at this time and was unable to offer placement to the patient.  The patient's spouse is currently working with DSS to establish if patient is able to qualify for Medicaid to cover costs for LTC placement.  CM and MSW will continue to explore options for LTC placement in a nursing home.   Expected Discharge Plan: Long Term Nursing Home Barriers to Discharge: Financial Resources,Inadequate or no insurance,SNF Pending payor source - LOG  Expected Discharge Plan and Services Expected Discharge Plan: Long Term Nursing Home In-house Referral: Clinical Social Work,Hospice / Palliative Care Discharge Planning Services: CM Consult Post Acute Care Choice: Nursing Home Living arrangements for the past 2 months: Single Family Home Expected Discharge Date: 10/16/20                                     Social Determinants of Health (SDOH) Interventions    Readmission Risk Interventions Readmission Risk Prevention Plan 10/29/2018  Post Dischage Appt Complete  Medication Screening Complete  Transportation Screening Complete  Some recent data might be hidden

## 2020-10-23 NOTE — Plan of Care (Signed)
  Problem: Education: Goal: Knowledge of General Education information will improve Description Including pain rating scale, medication(s)/side effects and non-pharmacologic comfort measures Outcome: Progressing   

## 2020-10-24 NOTE — Progress Notes (Signed)
Occupational Therapy Treatment Patient Details Name: Kristy Cabrera MRN: [id number] DOB: 04-08-1951 Today's Date: 10/24/2020    History of present illness 70 y.o. female presented 3/13 with new onset of left facial droop, left-sided weakness and slurred speech. CT of the head showed acute stroke on right insula and operculum PMH:  advanced dementia, HTN, meningioma s/p resection, seizure, unspecified left MCA stroke s/p left-sided endarterectomy in 2020, HLD, currently enrolled in home hospice   OT comments  Pt continues to slowly progress. Pt has improved performance with self feeding tasks, but movement still very limited due to shuffled gait and advanced dementia. Pt unable to properly care for self and spouse is unable as he is w/c bound. Pt continues to require continued OT skilled services. OT following acutely.   Follow Up Recommendations  SNF;Supervision/Assistance - 24 hour    Equipment Recommendations  3 in 1 bedside commode    Recommendations for Other Services      Precautions / Restrictions Precautions Precautions: Fall Precaution Comments: advanced dementia Restrictions Weight Bearing Restrictions: No       Mobility Bed Mobility Overal bed mobility: Needs Assistance Bed Mobility: Rolling;Sidelying to Sit Rolling: Min assist Sidelying to sit: Max assist       General bed mobility comments: cues for hand placement    Transfers Overall transfer level: Needs assistance Equipment used: Rolling walker (2 wheeled) Transfers: Sit to/from [facility] Sit to Stand: Min assist Stand pivot transfers: Min assist       General transfer comment: Stand x2 from bed and from [facility] for pericare; pt transferred to recliner with a few steps; pt nearly unable to take steps backwards.    Balance Overall balance assessment: Needs assistance Sitting-balance support: Feet supported;No upper extremity supported Sitting balance-Leahy Scale: Fair Sitting balance  - Comments: R lateral lean today Postural control: Right lateral lean;Posterior lean Standing balance support: Bilateral upper extremity supported Standing balance-Leahy Scale: Poor Standing balance comment: Reliant on B UE support and external assistance                           ADL either performed or assessed with clinical judgement   ADL Overall ADL's : Needs assistance/impaired Eating/Feeding: Set up;Sitting Eating/Feeding Details (indicate cue type and reason): Pt feeding self today after set-upA with drink containers. pt using fork and all utensils appropriately.                 Lower Body Dressing: Maximal assistance;Sitting/lateral leans Lower Body Dressing Details (indicate cue type and reason): donning socks             Functional mobility during ADLs: Minimal assistance;Rolling walker General ADL Comments: Pt. has  improved performance with self feeding tasks and movement still very limited due to shuffled gait and advanced dementia.     Vision       Perception     Praxis      Cognition Arousal/Alertness: Awake/alert Behavior During Therapy: WFL for tasks assessed/performed;Anxious Overall Cognitive Status: No family/caregiver present to determine baseline cognitive functioning                                 General Comments: Frequent mutlimodal cues required for 75% command follow. Pt appeared to gesture for needs like bring her food tray closer to her to eat. Pt was able to eat breakfast with only set-upA for drink containers (thickened).  Exercises     Shoulder Instructions       General Comments VSS    Pertinent Vitals/ Pain       Pain Assessment: Faces Faces Pain Scale: No hurt Pain Intervention(s): Monitored during session  Home Living                                          Prior Functioning/Environment              Frequency  Min 2X/week        Progress Toward  Goals  OT Goals(current goals can now be found in the care plan section)  Progress towards OT goals: Progressing toward goals  Acute Rehab OT Goals Patient Stated Goal: none stated OT Goal Formulation: Patient unable to participate in goal setting Time For Goal Achievement: 10/30/20 Potential to Achieve Goals: Good ADL Goals Pt Will Perform Eating: with modified independence;sitting Pt Will Perform Grooming: with supervision;standing;with set-up Pt Will Transfer to Toilet: with supervision;bedside commode;ambulating Additional ADL Goal #1: Pt. to follow 1  step directions  100 percent of time  Plan Discharge plan remains appropriate    Co-evaluation                 AM-PAC OT "6 Clicks" Daily Activity     Outcome Measure   Help from another person eating meals?: A Little Help from another person taking care of personal grooming?: A Lot Help from another person toileting, which includes using toliet, bedpan, or urinal?: A Lot Help from another person bathing (including washing, rinsing, drying)?: A Lot Help from another person to put on and taking off regular upper body clothing?: A Lot Help from another person to put on and taking off regular lower body clothing?: A Lot 6 Click Score: 13    End of Session Equipment Utilized During Treatment: Gait belt;Rolling walker  OT Visit Diagnosis: Unsteadiness on feet (R26.81);Muscle weakness (generalized) (M62.81);Cognitive communication deficit (R41.841) Symptoms and signs involving cognitive functions: Other cerebrovascular disease   Activity Tolerance Patient tolerated treatment well   Patient Left in chair;with call bell/phone within reach;with chair alarm set   Nurse Communication Mobility status        Time: 9480-1655 OT Time Calculation (min): 23 min  Charges: OT General Charges $OT Visit: 1 Visit OT Treatments $Self Care/Home Management : 23-37 mins  Jefferey Pica, OTR/L Acute Rehabilitation Services Pager:  [phone] Office: [phone]    [name] 10/24/2020, 5:22 PM

## 2020-10-24 NOTE — Progress Notes (Signed)
[facility] PROGRESS NOTE  Kristy Cabrera QIW:[id number] DOB: 07-Feb-1951 DOA: 10/01/2020 PCP: Kristy Iba, NP  Status: Remains inpatient appropriate because:Altered mental status, Unsafe d/c plan and Inpatient level of care appropriate due to severity of illness   Dispo: The patient is from: Home              Anticipated d/c is to: SNF; still lacks appropriate funding.  Medicaid application pending in [facility] with Kristy Cabrera [phone]; we also are waiting to determine if LOG assistance would be available.  This is likely facility dependent.              Patient currently is medically stable to d/c.   Difficult to place patient Yes   Level of care: Med-Surg  Code Status: DNR Family Communication: Patient only DVT prophylaxis: Lovenox Vaccination status: Unknown  Foley catheter: Purewick  HPI: 70 y.o.femalewith medical history significant ofadvanced dementia, HTN, meningioma s/p resection, seizure, unspecified left MCA stroke s/pleft-sided endarterectomy in 2020, HLD, currently enrolled in home hospice presented with new onset of left facial droop, left-sided weakness and slurred speech. On presentation code stroke was called. CT of the head showed acute stroke on right insula and operculum. Neurology was consulted.  PT recommended SNF   Subjective: Awake.  Sitting up in chair trying this.  Notable that she does much better when eating foods with a spoon out of a bowl.  Food on plate transfer to bowl to make a "breakfast bowl".  Patient able to eat much more easily.  Nods appropriately to questions asked.  Pleasant mood.  Objective: Vitals:   10/24/20 0010 10/24/20 0400  BP: (!) 158/99 (!) 154/89  Pulse: 75   Resp: 18   Temp: 98.5 F (36.9 C)   SpO2: 94%     Intake/Output Summary (Last 24 hours) at 10/24/2020 0753 Last data filed at 10/23/2020 1254 Gross per 24 hour  Intake --  Output 2 ml  Net -2 ml   Filed Weights   10/01/20 1338  Weight: 64  kg    Exam: General: Awake, sitting up in chair, calm, no acute distress Respiratory: Anterior lung sounds are clear to auscultation, no increased work of breathing, no coughing noted while eating.  Room air. Cardiovascular: S1-S2, no peripheral edema, no JVD, pulse regular Abdomen:  LBM 4/1, soft nontender.  Eating well when food set up appropriately.  Normoactive bowel sounds Neurologic: CN 2-12 grossly intact except for L facial droop-continues with aphasia.  Left upper extremity strength about 1-2/5 with right upper extremity strength about 3-4/5.  Sensation intact. Psychiatric: Alert, nods appropriately.  Has significant aphasia so unable to accurately assess orientation.  Pleasant affect.  Assessment/Plan: Acute problems: Acute right-sided stroke, right MCA territory -Continues to have slurred speech and left-sided facial droop.CT of the head was showing acute stroke in right insula and operculum. MRI of brain showed multiple small acute right MCA territory infarcts. CTA head showed new proximal right M2 occlusion with distal reconstitution with severe intracranial atherosclerosis and unchanged 50% mid right common carotid artery stenosis and patent left carotid endarterectomy without recurrent stenosis -Had stroke in April 2020 prior to carotid endarterectomy that resulted in severe aphasia which apparently had improved prior to current stroke. -Aspirin statin Plavix per neurology -Echo shows EF of 55 to 60% with grade 1 diastolic dysfunction. -A1c 5.7 -LDL 153 and cholesterol 220 -PT/OT- SNF recommended  Advanced dementia -Continue Aricept. -Fall precautions.  -Delirium precautions.  Moderate protein calorie malnutrition  Nutrition Problem:  Moderate Malnutrition Etiology: chronic illness (advanced dementia; s/p R MCA in 2020, declined since) Signs/Symptoms: moderate muscle depletion,mild muscle depletion,mild fat depletion Interventions: Ensure Enlive (each supplement  provides 350kcal and 20 grams of protein),Magic cup,MVI Estimated body mass index is 24.2 kg/m as calculated from the following:   Height as of this encounter: 5\' 4"  (1.626 m).   Weight as of this encounter: 64 kg. -Continue supplements -unit secretary updated to order finger foods and sandwiches to allow for more independent eating; in addition patient able to eat better if foods are placed in a bowl where she can use a spoon to scoop up more easily    Other problems:  Seizure disorder -Continue p.o. Keppra  Hyperlipidemia -Continue statin   Data Reviewed: Basic Metabolic Panel: Recent Labs  Lab 10/21/20 0206  NA 142  K 3.7  CL 111  CO2 24  GLUCOSE 122*  BUN 29*  CREATININE 0.93  CALCIUM 9.4   Liver Function Tests: Recent Labs  Lab 10/21/20 0206  AST 24  ALT 25  ALKPHOS 87  BILITOT 0.3  PROT 6.4*  ALBUMIN 3.2*   No results for input(s): LIPASE, AMYLASE in the last 168 hours. No results for input(s): AMMONIA in the last 168 hours. CBC: Recent Labs  Lab 10/21/20 0206  WBC 10.5  NEUTROABS 6.2  HGB 13.0  HCT 39.3  MCV 89.3  PLT 333   Cardiac Enzymes: No results for input(s): CKTOTAL, CKMB, CKMBINDEX, TROPONINI in the last 168 hours. BNP (last 3 results) No results for input(s): BNP in the last 8760 hours.  ProBNP (last 3 results) No results for input(s): PROBNP in the last 8760 hours.  CBG: No results for input(s): GLUCAP in the last 168 hours.  No results found for this or any previous visit (from the past 240 hour(s)).   Studies: No results found.  Scheduled Meds: . aspirin EC  81 mg Oral Daily  . atorvastatin  80 mg Oral q1800  . clopidogrel  75 mg Oral Daily  . donepezil  5 mg Oral QHS  . enoxaparin (LOVENOX) injection  40 mg Subcutaneous Q24H  . levETIRAcetam  750 mg Oral BID  . melatonin  3 mg Oral QHS  . multivitamin with minerals  1 tablet Oral Daily  . sodium chloride flush  3 mL Intravenous Once   Continuous  Infusions:  Active Problems:   COPD (chronic obstructive pulmonary disease) (HCC)   Acute ischemic stroke (HCC)   Stroke (cerebrum) (HCC)   Malnutrition of moderate degree   Hospice care patient   Physical deconditioning   Consultants:  Neurology  Palliative medicine  Procedures:  2D echocardiogram  Antibiotics:   Anti-infectives (From admission, onward)   None       Time spent: 20 minutes    [name] ANP  [facility] 7 am - 330 pm/M-F for direct patient care and secure chat Please refer to Amion for contact info 23  days

## 2020-10-25 DIAGNOSIS — R1314 Dysphagia, pharyngoesophageal phase: Secondary | ICD-10-CM

## 2020-10-25 NOTE — Progress Notes (Signed)
  Speech Language Pathology Treatment: Dysphagia  Patient Details Name: Kristy Cabrera MRN: [id number] DOB: [birth year] Today's Date: 10/25/2020 Time: 0174-9449 SLP Time Calculation (min) (ACUTE ONLY): 23.9 min  Assessment / Plan / Recommendation Clinical Impression  Pt was seen for dysphagia treatment. She was alert and cooperative during the session without c/o pain. Pt's RN reported that the pt did not eat breakfast and that today is her first day with the pt. Pt was seen during lunch with a meal of spaghetti with beef, nectar thick liquids and trials of thin liquids. She demonstrated symptoms of oropharyngeal dysphagia characterized by reduced bolus awareness, minimal mastication, oral residue, and signs of aspiration with solids which required mastication and when she used a liquid possibly to assist with mastication, bolus formation, and/or bolus propulsion. Length of oral holding was increased compared to prior sessions and cueing was frequently needed for mastication and/or for swallowing. Pt demonstrated impulsive tendencies throughout the session and frequently requested additional boluses while still holding boluses or during mastication. Pt remained symptomatic of aspiration despite use of swallowing precautions. Pt's diet will downgraded to dysphagia 1 solids and nectar thick liquids. A modified barium swallow study is recommended to further assess swallow function and it will be planned for 4/7.   HPI HPI: 70 y.o. female with medical history significant for advanced dementia, HTN, meningioma s/p resection, seizure, unspecified left MCA stroke s/p left-sided endarterectomy in 2020, HLD, currently enrolled in home hospice presented with new onset of left facial droop, left-sided weakness and slurred speech.  CT of the head showed acute stroke on right insula and operculum. PTA pt fed herself a regular diet. MBSS 10/09/20 indicated intermittent aspiratin with thin liquids that was inconsistently  sensed with diet recommendations of nectar thick and regular textures.      SLP Plan  MBS       Recommendations  Diet recommendations: Dysphagia 1 (puree);Nectar-thick liquid Liquids provided via: Cup;Straw Medication Administration: Whole meds with puree Supervision: Staff to assist with self feeding;Full supervision/cueing for compensatory strategies Compensations: Slow rate;Small sips/bites Postural Changes and/or Swallow Maneuvers: Seated upright 90 degrees                Oral Care Recommendations: Oral care BID;Oral care prior to ice chip/H20 Follow up Recommendations: Skilled Nursing facility SLP Visit Diagnosis: Dysphagia, oropharyngeal phase (R13.12);Dysarthria and anarthria (R47.1) Plan: MBS       Ibn Stief I. Hardin Negus, [place], [place] Office number [phone] Pager [phone]                 Horton Marshall 10/25/2020, 3:13 PM

## 2020-10-25 NOTE — Progress Notes (Signed)
Physical Therapy Treatment Patient Details Name: Kristy Cabrera MRN: [id number] DOB: [birth year] Today's Date: 10/25/2020    History of Present Illness 70 y.o. female presented 3/13 with new onset of left facial droop, left-sided weakness and slurred speech. CT of the head showed acute stroke on right insula and operculum PMH:  advanced dementia, HTN, meningioma s/p resection, seizure, unspecified left MCA stroke s/p left-sided endarterectomy in 2020, HLD, currently enrolled in home hospice    PT Comments    Pt was seen for mobility on side of bed with mod assist to get there and some active resistance from pt.  After attaining side of bed, she was able to attempt to stand but did not exert a force through LE's to actually get to stand.  Follow up with her to get better follow through, to increase L side awareness and to increase her control of sit to stand transfer.  Pt is seemingly less able to stand up and move than her last PT visit, possibly just tired.  Encourage OOB to chair as well with PT and staff.  Follow Up Recommendations  SNF     Equipment Recommendations  None recommended by PT    Recommendations for Other Services       Precautions / Restrictions Precautions Precautions: Fall Precaution Comments: advanced dementia Restrictions Weight Bearing Restrictions: No    Mobility  Bed Mobility Overal bed mobility: Needs Assistance Bed Mobility: Rolling;Supine to Sit;Sit to Supine Rolling: Mod assist   Supine to sit: Mod assist Sit to supine: Max assist   General bed mobility comments: cued hand placement and cued sequence    Transfers Overall transfer level: Needs assistance Equipment used: Rolling walker (2 wheeled) Transfers: Sit to/from Stand Sit to Stand: Max assist;From elevated surface         General transfer comment: attempts to stand but pt is not doing well with follow through  Ambulation/Gait             General Gait Details: unable to stand  to try it   Stairs             Wheelchair Mobility    Modified Rankin (Stroke Patients Only)       Balance Overall balance assessment: Needs assistance Sitting-balance support: Feet supported Sitting balance-Leahy Scale: Fair Sitting balance - Comments: R gaze preference and could not get her to attend to L side     Standing balance-Leahy Scale: Zero                              Cognition Arousal/Alertness: Awake/alert Behavior During Therapy: Flat affect Overall Cognitive Status: No family/caregiver present to determine baseline cognitive functioning                                 General Comments: multi modal cues but follow through is low, cannot roll her to the L side      Exercises      General Comments General comments (skin integrity, edema, etc.): Pt is on side of bed with poor control of balance skills, cannot get her to organize transfers and hand placement, no push off on feet to stand      Pertinent Vitals/Pain Pain Assessment: Faces Faces Pain Scale: Hurts a little bit Pain Location: grimacing during bed mobility Pain Descriptors / Indicators: Grimacing Pain Intervention(s): Monitored during session;Repositioned    Home  Living                      Prior Function            PT Goals (current goals can now be found in the care plan section) Acute Rehab PT Goals Patient Stated Goal: not verbalizing    Frequency    Min 3X/week      PT Plan Current plan remains appropriate    Co-evaluation              AM-PAC PT "6 Clicks" Mobility   Outcome Measure  Help needed turning from your back to your side while in a flat bed without using bedrails?: A Lot Help needed moving from lying on your back to sitting on the side of a flat bed without using bedrails?: A Lot Help needed moving to and from a bed to a chair (including a wheelchair)?: A Lot Help needed standing up from a chair using your arms  (e.g., wheelchair or bedside chair)?: A Lot Help needed to walk in hospital room?: Total Help needed climbing 3-5 steps with a railing? : Total 6 Click Score: 10    End of Session Equipment Utilized During Treatment: Gait belt Activity Tolerance: Patient tolerated treatment well Patient left: with call bell/phone within reach;in bed;with bed alarm set Nurse Communication: Mobility status PT Visit Diagnosis: Muscle weakness (generalized) (M62.81);Difficulty in walking, not elsewhere classified (R26.2);Other abnormalities of gait and mobility (R26.89)     Time: 1550-1605 PT Time Calculation (min) (ACUTE ONLY): 15 min  Charges:  $Therapeutic Activity: 8-22 mins Ramond Dial 10/25/2020, 4:36 PM  Mee Hives, PT MS Acute Rehab Dept. Number: [place] and [place]

## 2020-10-25 NOTE — Progress Notes (Signed)
[facility] PROGRESS NOTE  Evah Rashid GQQ:[id number] DOB: 1950-09-28 DOA: 10/01/2020 PCP: [name], NP  Status: Remains inpatient appropriate because:Altered mental status, Unsafe d/c plan and Inpatient level of care appropriate due to severity of illness   Dispo: The patient is from: Home              Anticipated d/c is to: SNF; still lacks appropriate funding.  Medicaid application pending in [facility] with [name] [phone]; we also are waiting to determine if LOG assistance would be available.  This is likely facility dependent.              Patient currently is medically stable to d/c.   Difficult to place patient Yes   Level of care: Med-Surg  Code Status: DNR Family Communication: Patient only DVT prophylaxis: Lovenox Vaccination status: Unknown  Foley catheter: Purewick  HPI: 70 y.o.femalewith medical history significant ofadvanced dementia, HTN, meningioma s/p resection, seizure, unspecified left MCA stroke s/pleft-sided endarterectomy in 2020, HLD, currently enrolled in home hospice presented with new onset of left facial droop, left-sided weakness and slurred speech. On presentation code stroke was called. CT of the head showed acute stroke on right insula and operculum. Neurology was consulted.  PT recommended SNF   Subjective: Sleeping but awakened during exam.  Not ready to eat yet.  Went back to sleep.  Objective: Vitals:   10/24/20 2132 10/25/20 0606  BP: (!) 164/77 (!) 172/75  Pulse: 69 75  Resp: 17 17  Temp: 98.9 F (37.2 C) 99 F (37.2 C)  SpO2: 95% 93%   No intake or output data in the 24 hours ending 10/25/20 0741 Filed Weights   10/01/20 1338  Weight: 64 kg    Exam: General: Sleeping but awakened easily.,  No acute distress Respiratory: Anterior lung sounds remain clear, stable on room air Cardiovascular: Heart sounds are normal, no peripheral edema detected, pulse regular, skin warm and dry Abdomen:  LBM  4/1, soft and nontender, normoactive bowel Neurologic: CN 2-12 grossly intact except for L facial droop-continues with aphasia.  Left upper extremity strength about 1-2/5 with right upper extremity strength about 3-4/5.  Sensation intact. Psychiatric: Awake is alert and at least oriented to name.  Responds appropriately to questions by nodding.  Occasionally can verbalize responses but has significant aphasia.  Pleasant affect when awake.  Assessment/Plan: Acute problems: Acute right-sided stroke, right MCA territory -Continues to have slurred speech and left-sided facial droop.CT of the head positve for CVA andMRI of brain showed multiple small acute right MCA territory infarcts. CTA w/ new proximal right M2 occlusion with distal reconstitution, severe intracranial atherosclerosis, unchanged 50% mid right common carotid artery stenosis.Left carotid endarterectomy patent without recurrent stenosis -Had stroke in April 2020 prior to carotid endarterectomy that resulted in severe aphasia which apparently had improved prior to current stroke. -Aspirin, statin, Plavix per neurology -Echo shows EF of 55 to 60% with grade 1 diastolic dysfunction. -A1c 5.7 -LDL 153 and cholesterol 220 -PT/OT- SNF recommended  Advanced dementia -Continue Aricept. -Fall and delirium precautions.   Moderate protein calorie malnutrition  Nutrition Problem: Moderate Malnutrition Etiology: chronic illness (advanced dementia; s/p R MCA in 2020, declined since) Signs/Symptoms: moderate muscle depletion,mild muscle depletion,mild fat depletion Interventions: Ensure Enlive (each supplement provides 350kcal and 20 grams of protein),Magic cup,MVI Estimated body mass index is 24.2 kg/m as calculated from the following:   Height as of this encounter: 5\' 4"  (1.626 m).   Weight as of this encounter: 64 kg. -  Continue supplements -Eats better if foods are placed in a bowl where she can use a spoon to scoop up more  easily    Other problems:  Seizure disorder -Continue p.o. Keppra  Hyperlipidemia -Continue statin   Data Reviewed: Basic Metabolic Panel: Recent Labs  Lab 10/21/20 0206  NA 142  K 3.7  CL 111  CO2 24  GLUCOSE 122*  BUN 29*  CREATININE 0.93  CALCIUM 9.4   Liver Function Tests: Recent Labs  Lab 10/21/20 0206  AST 24  ALT 25  ALKPHOS 87  BILITOT 0.3  PROT 6.4*  ALBUMIN 3.2*   No results for input(s): LIPASE, AMYLASE in the last 168 hours. No results for input(s): AMMONIA in the last 168 hours. CBC: Recent Labs  Lab 10/21/20 0206  WBC 10.5  NEUTROABS 6.2  HGB 13.0  HCT 39.3  MCV 89.3  PLT 333   Cardiac Enzymes: No results for input(s): CKTOTAL, CKMB, CKMBINDEX, TROPONINI in the last 168 hours. BNP (last 3 results) No results for input(s): BNP in the last 8760 hours.  ProBNP (last 3 results) No results for input(s): PROBNP in the last 8760 hours.  CBG: No results for input(s): GLUCAP in the last 168 hours.  No results found for this or any previous visit (from the past 240 hour(s)).   Studies: No results found.  Scheduled Meds: . aspirin EC  81 mg Oral Daily  . atorvastatin  80 mg Oral q1800  . clopidogrel  75 mg Oral Daily  . donepezil  5 mg Oral QHS  . enoxaparin (LOVENOX) injection  40 mg Subcutaneous Q24H  . levETIRAcetam  750 mg Oral BID  . melatonin  3 mg Oral QHS  . multivitamin with minerals  1 tablet Oral Daily  . sodium chloride flush  3 mL Intravenous Once   Continuous Infusions:  Active Problems:   COPD (chronic obstructive pulmonary disease) (HCC)   Acute ischemic stroke (HCC)   Stroke (cerebrum) (HCC)   Malnutrition of moderate degree   Hospice care patient   Physical deconditioning   Consultants:  Neurology  Palliative medicine  Procedures:  2D echocardiogram  Antibiotics:   Anti-infectives (From admission, onward)   None       Time spent: 20 minutes    [name] ANP  [facility] 7  am - 330 pm/M-F for direct patient care and secure chat Please refer to Amion for contact info 24  days

## 2020-10-26 ENCOUNTER — Inpatient Hospital Stay (HOSPITAL_COMMUNITY)

## 2020-10-26 NOTE — Plan of Care (Signed)
Patient resting comfortably without any distress. Patient went for the barium swallow eval this morning. Patient had no complaints. Bed in low position and locked. Call bell in reach. All needs met at this time.  Problem: Education: Goal: Knowledge of General Education information will improve Description: Including pain rating scale, medication(s)/side effects and non-pharmacologic comfort measures Outcome: Progressing   Problem: Health Behavior/Discharge Planning: Goal: Ability to manage health-related needs will improve Outcome: Progressing   Problem: Clinical Measurements: Goal: Ability to maintain clinical measurements within normal limits will improve Outcome: Progressing Goal: Will remain free from infection Outcome: Progressing Goal: Diagnostic test results will improve Outcome: Progressing Goal: Respiratory complications will improve Outcome: Progressing Goal: Cardiovascular complication will be avoided Outcome: Progressing   Problem: Activity: Goal: Risk for activity intolerance will decrease Outcome: Progressing   Problem: Nutrition: Goal: Adequate nutrition will be maintained Outcome: Progressing   Problem: Coping: Goal: Level of anxiety will decrease Outcome: Progressing   Problem: Elimination: Goal: Will not experience complications related to bowel motility Outcome: Progressing Goal: Will not experience complications related to urinary retention Outcome: Progressing   Problem: Pain Managment: Goal: General experience of comfort will improve Outcome: Progressing   Problem: Safety: Goal: Ability to remain free from injury will improve Outcome: Progressing   Problem: Skin Integrity: Goal: Risk for impaired skin integrity will decrease Outcome: Progressing   Problem: Education: Goal: Knowledge of disease or condition will improve Outcome: Progressing Goal: Knowledge of secondary prevention will improve Outcome: Progressing Goal: Knowledge of patient  specific risk factors addressed and post discharge goals established will improve Outcome: Progressing Goal: Individualized Educational Video(s) Outcome: Progressing   Problem: Coping: Goal: Will verbalize positive feelings about self Outcome: Progressing Goal: Will identify appropriate support needs Outcome: Progressing   Problem: Health Behavior/Discharge Planning: Goal: Ability to manage health-related needs will improve Outcome: Progressing   Problem: Self-Care: Goal: Ability to participate in self-care as condition permits will improve Outcome: Progressing Goal: Verbalization of feelings and concerns over difficulty with self-care will improve Outcome: Progressing Goal: Ability to communicate needs accurately will improve Outcome: Progressing   Problem: Nutrition: Goal: Risk of aspiration will decrease Outcome: Progressing Goal: Dietary intake will improve Outcome: Progressing   Problem: Ischemic Stroke/TIA Tissue Perfusion: Goal: Complications of ischemic stroke/TIA will be minimized Outcome: Progressing

## 2020-10-26 NOTE — Progress Notes (Signed)
Modified Barium Swallow Progress Note  Patient Details  Name: Kristy Cabrera MRN: [id number] Date of Birth: 09-Apr-1951  Today's Date: 10/26/2020  Modified Barium Swallow completed.  Full report located under Chart Review in the Imaging Section.  Brief recommendations include the following:  Clinical Impression  Pt continues to present with oropharyngeal dysphagia characterized by reduced bolus awareness, impaired mastication, impaired posterior bolus propulsion, and a pharyngeal delay. Pt demonstrated oral holding accross boluses which persisted despite verbal and tactile cues. Lingual pumping was intermittently observed during attempts at posterior bolus propulsion. Mastication was inadequate for bolus size and consistency with regular texture solids and oral holding was most significant with purees. Pt repeatedly requested thin liquids to assist with mastication and A-P transport. Transient penetration (PAS 2) was noted with thin liquids via straw, but this is considered to be WNL and no aspiration was noted.Overall, the pharyngeal phase of the pt's swallow is improved compared to the last study on 10/09/20; however, she has demonstrated more significant oral phase deficits which are likely cognitively based. Considering her presentation at bedside, SLP anticipates that depth of laryngeal invasion increases with suboptimal positioning and when pt is less able to attend to p.o. intake. Pt's diet will be advanced to dysphagia 2 solids and thin liquids via cup. SLP will continue to follow for dysphagia treatment.    Swallow Evaluation Recommendations       SLP Diet Recommendations: Dysphagia 2 (Fine chop) solids;Thin liquid   Liquid Administration via: Cup;No straw   Medication Administration: Crushed with puree   Supervision: Staff to assist with self feeding   Compensations: Slow rate;Small sips/bites   Postural Changes: Seated upright at 90 degrees   Oral Care Recommendations: Oral care  BID      [name] I. Hardin Negus, [place], [place] Office number [phone] Pager [phone]   Horton Marshall 10/26/2020,12:07 PM

## 2020-10-26 NOTE — Progress Notes (Addendum)
[facility] PROGRESS NOTE  Kristy Cabrera YQM:[id number] DOB: [birth year] DOA: 10/01/2020 PCP: Kristy Iba, NP  Status: Remains inpatient appropriate because:Altered mental status, Unsafe d/c plan and Inpatient level of care appropriate due to severity of illness   Dispo: The patient is from: Home              Anticipated d/c is to: SNF; still lacks appropriate funding.  Medicaid application pending in [facility] with Kristy Cabrera [id number]; we also are waiting to determine if LOG assistance would be available.  This is likely facility dependent.              Patient currently is medically stable to d/c.   Difficult to place patient Yes   Level of care: Med-Surg  Code Status: DNR Family Communication: 4/7 Husband Kristy Cabrera updated DVT prophylaxis: Lovenox Vaccination status: Unknown  Foley catheter: Purewick  HPI: 70 y.o.femalewith medical history significant ofadvanced dementia, HTN, meningioma s/p resection, seizure, unspecified left MCA stroke s/pleft-sided endarterectomy in 2020, HLD, currently enrolled in home hospice presented with new onset of left facial droop, left-sided weakness and slurred speech. On presentation code stroke was called. CT of the head showed acute stroke on right insula and operculum. Neurology was consulted.  PT recommended SNF   Subjective: Awakened from sleep.  Pleasant.  Still having difficulty communicating secondary to aphasia as well as appropriately.  Objective: Vitals:   10/25/20 2115 10/26/20 0500  BP: 105/74 (!) 142/88  Pulse: 64 72  Resp: 18 18  Temp: 98 F (36.7 C) 98.5 F (36.9 C)  SpO2: 96% 98%   No intake or output data in the 24 hours ending 10/26/20 0748 Filed Weights   10/01/20 1338  Weight: 64 kg    Exam: General: Pleasant, appears to be comfortable, no acute distress Respiratory: Anterior lung sounds are clear to auscultation, bilateral Cardiovascular: S1-S2, no peripheral edema, pulse regular no  peripheral edema, pulse regular Abdomen:  LBM 4/1,  Neurologic: CN 2-12 grossly intact except for L facial droop-continues with aphasia.  Left upper extremity strength about 1-2/5 with right upper extremity strength about 3-4/5.  Sensation intact. Psychiatric: Awakens, oriented times name only.  Given degree of aphasia difficult to determine actual orientation.  Pleasant affect.  Assessment/Plan: Acute problems: Acute right-sided stroke, right MCA territory -Continues to have slurred speech and left-sided facial droop.CT of the head positve for CVA andMRI of brain showed multiple small acute right MCA territory infarcts. CTA w/ new proximal right M2 occlusion with distal reconstitution, severe intracranial atherosclerosis, unchanged 50% mid right common carotid artery stenosis.Left carotid endarterectomy patent without recurrent stenosis -Had stroke in April 2020 prior to carotid endarterectomy that resulted in severe aphasia which apparently had improved prior to current stroke. -Aspirin, statin, Plavix per neurology -Echo shows EF of 55 to 60% with grade 1 diastolic dysfunction. -A1c 5.7 -LDL 153 and cholesterol 220 -PT/OT- SNF recommended  Advanced dementia -Continue Aricept. -Fall and delirium precautions.   Moderate protein calorie malnutrition  Nutrition Problem: Moderate Malnutrition Etiology: chronic illness (advanced dementia; s/p R MCA in 2020, declined since) Signs/Symptoms: moderate muscle depletion,mild muscle depletion,mild fat depletion Interventions: Ensure Enlive (each supplement provides 350kcal and 20 grams of protein),Magic cup,MVI Estimated body mass index is 24.2 kg/m as calculated from the following:   Height as of this encounter: 5\' 4"  (1.626 m).   Weight as of this encounter: 64 kg. -Continue supplements -Eats better if foods are placed in a bowl where she can use a spoon to  scoop up more easily -Reevaluated by SLP on 4/6 and recommended downgraded diet  to D2    Other problems:  Seizure disorder -Continue p.o. Keppra  Hyperlipidemia -Continue statin   Data Reviewed: Basic Metabolic Panel: Recent Labs  Lab 10/21/20 0206  NA 142  K 3.7  CL 111  CO2 24  GLUCOSE 122*  BUN 29*  CREATININE 0.93  CALCIUM 9.4   Liver Function Tests: Recent Labs  Lab 10/21/20 0206  AST 24  ALT 25  ALKPHOS 87  BILITOT 0.3  PROT 6.4*  ALBUMIN 3.2*   No results for input(s): LIPASE, AMYLASE in the last 168 hours. No results for input(s): AMMONIA in the last 168 hours. CBC: Recent Labs  Lab 10/21/20 0206  WBC 10.5  NEUTROABS 6.2  HGB 13.0  HCT 39.3  MCV 89.3  PLT 333   Cardiac Enzymes: No results for input(s): CKTOTAL, CKMB, CKMBINDEX, TROPONINI in the last 168 hours. BNP (last 3 results) No results for input(s): BNP in the last 8760 hours.  ProBNP (last 3 results) No results for input(s): PROBNP in the last 8760 hours.  CBG: No results for input(s): GLUCAP in the last 168 hours.  No results found for this or any previous visit (from the past 240 hour(s)).   Studies: No results found.  Scheduled Meds: . aspirin EC  81 mg Oral Daily  . atorvastatin  80 mg Oral q1800  . clopidogrel  75 mg Oral Daily  . donepezil  5 mg Oral QHS  . enoxaparin (LOVENOX) injection  40 mg Subcutaneous Q24H  . levETIRAcetam  750 mg Oral BID  . melatonin  3 mg Oral QHS  . multivitamin with minerals  1 tablet Oral Daily  . sodium chloride flush  3 mL Intravenous Once   Continuous Infusions:  Active Problems:   COPD (chronic obstructive pulmonary disease) (HCC)   Acute ischemic stroke (HCC)   Stroke (cerebrum) (HCC)   Malnutrition of moderate degree   Hospice care patient   Physical deconditioning   Dysphagia, pharyngoesophageal phase   Consultants:  Neurology  Palliative medicine  Procedures:  2D echocardiogram  Antibiotics:   Anti-infectives (From admission, onward)   None       Time spent: 20  minutes    Kristy Cabrera ANP  [facility] 7 am - 330 pm/M-F for direct patient care and secure chat Please refer to Amion for contact info 25  days

## 2020-10-26 NOTE — Progress Notes (Signed)
CSW attempted to reach patient's Medicaid worker [place] [phone]) at [place] without success - a voicemail was left requesting a return call.  Madilyn Fireman, MSW, LCSW Transitions of Care  Clinical Social Worker II [phone]

## 2020-10-27 MED ORDER — HYDRALAZINE HCL 20 MG/ML IJ SOLN: 2.0000 mg | Freq: Four times a day (QID) | INTRAMUSCULAR | Status: DC | PRN

## 2020-10-27 MED ORDER — AMLODIPINE BESYLATE 5 MG PO TABS
5.0000 mg | ORAL_TABLET | Freq: Every day | ORAL | Status: DC
  Administered 2020-10-27 – 2020-11-04 (×8): 5 mg via ORAL
  Filled 2020-10-27 (×9): qty 1

## 2020-10-27 MED ORDER — MEMANTINE HCL 10 MG PO TABS
10.0000 mg | ORAL_TABLET | Freq: Every day | ORAL | Status: DC
  Administered 2020-10-27 – 2020-11-10 (×15): 10 mg via ORAL
  Filled 2020-10-27 (×15): qty 1

## 2020-10-27 MED ORDER — LOSARTAN POTASSIUM 25 MG PO TABS
25.0000 mg | ORAL_TABLET | Freq: Every day | ORAL | Status: DC
  Administered 2020-10-27 – 2020-11-10 (×15): 25 mg via ORAL
  Filled 2020-10-27 (×15): qty 1

## 2020-10-27 NOTE — Progress Notes (Signed)
   This pt continues to be active with Hospice services at residential level of care in hospital. I have spoke to Joe her husband about if he has heard from [facility] office and he reports that Tuesday of this week he submitted some additional back records and things that they had requested. We continue to follow pt while awaiting to hear if she will qualify for Medicaid to move forward with plan of LTC facility.  He will reach out to [place] if he hears any additional news and I can update  Case manager.   Joe continues to state that he can not take care of his wife in the condition he is in and has had several days of not being well himself which has impacted him even being able to come to hospital to see his wife. He appreciates any updates that we or hospital staff can give him on how she is doing.   Webb Silversmith RN [phone]

## 2020-10-27 NOTE — Progress Notes (Signed)
[facility] PROGRESS NOTE  Kristy Cabrera WCH:[id number] DOB: 02/24/1951 DOA: 10/01/2020 PCP: [name], NP  Status: Remains inpatient appropriate because:Altered mental status, Unsafe d/c plan and Inpatient level of care appropriate due to severity of illness   Dispo: The patient is from: Home              Anticipated d/c is to: SNF; still lacks appropriate funding.  Medicaid application pending in [facility] with Kristy Cabrera [phone]; we also are waiting to determine if LOG assistance would be available.  This is likely facility dependent.              Patient currently is medically stable to d/c.   Difficult to place patient Yes   Level of care: Med-Surg  Code Status: DNR Family Communication: 4/7 Husband Kristy Cabrera updated DVT prophylaxis: Lovenox Vaccination status: Unknown  Foley catheter: Purewick  HPI: 70 y.o.femalewith medical history significant ofadvanced dementia, HTN, meningioma s/p resection, seizure, unspecified left MCA stroke s/pleft-sided endarterectomy in 2020, HLD, currently enrolled in home hospice presented with new onset of left facial droop, left-sided weakness and slurred speech. On presentation code stroke was called. CT of the head showed acute stroke on right insula and operculum. Neurology was consulted.  PT recommended SNF   Subjective: In bed trying to feed self breakfast but is having some difficulty utilizing a fork.  Nurse at bedside and has made me aware BP significantly elevated today and parameters for as needed meds very high/initial stroke range  Objective: Vitals:   10/26/20 1944 10/27/20 0548  BP: (!) 153/70 (!) 148/68  Pulse: 86 78  Resp: 20 18  Temp: 98.6 F (37 C) 98.2 F (36.8 C)  SpO2: 98% 98%   No intake or output data in the 24 hours ending 10/27/20 0732 Filed Weights   10/01/20 1338  Weight: 64 kg    Exam: General: Awake and alert and in no acute distress, appears to be comfortable Respiratory:  Anterior lung sounds are clear, room air Cardiovascular: Heart sounds are normal, BP elevated with SBP in the 180-1 90 range.  No tachycardia, no peripheral edema. Abdomen:  LBM 4/4, abdomen soft nontender.  Difficulty unloading secondary to mechanical issues.  Normoactive bowel sounds Neurologic: CN 2-12 grossly intact except for L facial droop-continues with aphasia.  Left upper extremity strength about 1-2/5 with right upper extremity strength about 3-4/5.  Sensation intact. Psychiatric: Awake and oriented times name and place.  Was able to speak a little bit better with improvement and aphasia still has difficulty with word finding and fluent conversation  Assessment/Plan: Acute problems: Acute right-sided stroke, right MCA territory -Continues to have slurred speech and left-sided facial droop.CT of the head positve for CVA andMRI of brain showed multiple small acute right MCA territory infarcts. CTA w/ new proximal right M2 occlusion with distal reconstitution, severe intracranial atherosclerosis, unchanged 50% mid right common carotid artery stenosis.Left carotid endarterectomy patent without recurrent stenosis -Had stroke in April 2020 prior to carotid endarterectomy that resulted in severe aphasia which apparently had improved prior to current stroke. -Aspirin, statin, Plavix per neurology -Echo shows EF of 55 to 60% with grade 1 diastolic dysfunction. -A1c 5.7 -LDL 153 and cholesterol 220 -PT/OT- SNF recommended  Uncontrolled hypertension -SBP in the 180-1 90 range today -Was on Cozaar and Norvasc at home-these medications will be resumed as of 4/8 -Also has as needed hydralazine available if needed for breakthrough hypertension as meds are being adjusted.  Parameters decreased to administer for  SBP >/= 180 noting previous parameters for in the initial stroke admission ranges  Advanced dementia -Continue Aricept. -Resume home Namenda -Fall and delirium precautions.    Moderate protein calorie malnutrition  Nutrition Problem: Moderate Malnutrition Etiology: chronic illness (advanced dementia; s/p R MCA in 2020, declined since) Signs/Symptoms: moderate muscle depletion,mild muscle depletion,mild fat depletion Interventions: Ensure Enlive (each supplement provides 350kcal and 20 grams of protein),Magic cup,MVI Estimated body mass index is 24.2 kg/m as calculated from the following:   Height as of this encounter: 5\' 4"  (1.626 m).   Weight as of this encounter: 64 kg. -Continue supplements -Eats better if foods are placed in a bowl where she can use a spoon to scoop up more easily -Reevaluated by SLP on 4/7 w/ MBSS  recommended contine D2 diet and thin liquids    Other problems:  Seizure disorder -Continue p.o. Keppra  Hyperlipidemia -Continue statin   Data Reviewed: Basic Metabolic Panel: Recent Labs  Lab 10/21/20 0206  NA 142  K 3.7  CL 111  CO2 24  GLUCOSE 122*  BUN 29*  CREATININE 0.93  CALCIUM 9.4   Liver Function Tests: Recent Labs  Lab 10/21/20 0206  AST 24  ALT 25  ALKPHOS 87  BILITOT 0.3  PROT 6.4*  ALBUMIN 3.2*   No results for input(s): LIPASE, AMYLASE in the last 168 hours. No results for input(s): AMMONIA in the last 168 hours. CBC: Recent Labs  Lab 10/21/20 0206  WBC 10.5  NEUTROABS 6.2  HGB 13.0  HCT 39.3  MCV 89.3  PLT 333   Cardiac Enzymes: No results for input(s): CKTOTAL, CKMB, CKMBINDEX, TROPONINI in the last 168 hours. BNP (last 3 results) No results for input(s): BNP in the last 8760 hours.  ProBNP (last 3 results) No results for input(s): PROBNP in the last 8760 hours.  CBG: No results for input(s): GLUCAP in the last 168 hours.  No results found for this or any previous visit (from the past 240 hour(s)).   Studies: DG Swallowing Func-Speech Pathology  Result Date: 10/26/2020 Objective Swallowing Evaluation: Type of Study: MBS-Modified Barium Swallow Study  Patient Details Name:  Kristy Cabrera MRN: [id number] Date of Birth: [birth year] Today's Date: 10/26/2020 Time: SLP Start Time (ACUTE ONLY): 1035 -SLP Stop Time (ACUTE ONLY): 1055 SLP Time Calculation (min) (ACUTE ONLY): 20 min Past Medical History: Past Medical History: Diagnosis Date . Brain tumor (benign) ([place])  . Dementia ([place])  . Episodic mood disorder ([place])  . Hyperlipidemia  . Hypertension  . Insomnia  . Osteopenia  . Weakness of both legs  Past Surgical History: Past Surgical History: Procedure Laterality Date . Brain tumor removal   . ENDARTERECTOMY Left 10/28/2018  Procedure: Left Carotid ENDARTERECTOMY;  Surgeon: [name], MD;  Location: [facility];  Service: Vascular;  Laterality: Left; [facility] MELANOMA EXCISION  1987  BACK HPI: 70 y.o. female with medical history significant for advanced dementia, HTN, meningioma s/p resection, seizure, unspecified left MCA stroke s/p left-sided endarterectomy in 2020, HLD, currently enrolled in home hospice presented with new onset of left facial droop, left-sided weakness and slurred speech.  CT of the head showed acute stroke on right insula and operculum. PTA pt fed herself a regular diet. MBSS 10/09/20 indicated intermittent aspiration with thin liquids that was inconsistently sensed with diet recommendations of nectar thick and regular textures.  Subjective: alert, attempting to communicate Assessment / Plan / Recommendation CHL IP CLINICAL IMPRESSIONS 10/26/2020 Clinical Impression Pt continues to present with oropharyngeal dysphagia characterized by  reduced bolus awareness, impaired mastication, impaired posterior bolus propulsion, and a pharyngeal delay. Pt demonstrated oral holding accross boluses which persisted despite verbal and tactile cues. Lingual pumping was intermittently observed during attempts at posterior bolus propulsion. Mastication was inadequate for bolus size and consistency with regular texture solids and oral holding was most significant with purees. Pt repeatedly requested thin  liquids to assist with mastication and A-P transport. Transient penetration (PAS 2) was noted with thin liquids via straw, but this is considered to be WNL and no aspiration was noted.Overall, the pharyngeal phase of the pt's swallow is improved compared to the last study on 10/09/20; however, she has demonstrated more significant oral phase deficits which are likely cognitively based. Considering her presentation at bedside, SLP anticipates that depth of laryngeal invasion increases with suboptimal positioning and when pt is less able to attend to p.o. intake. Pt's diet will be advanced to dysphagia 2 solids and thin liquids via cup. SLP will continue to follow for dysphagia treatment. SLP Visit Diagnosis Dysphagia, oropharyngeal phase (R13.12) Attention and concentration deficit following -- Frontal lobe and executive function deficit following -- Impact on safety and function Mild aspiration risk;Moderate aspiration risk   CHL IP TREATMENT RECOMMENDATION 10/26/2020 Treatment Recommendations Therapy as outlined in treatment plan below   Prognosis 10/26/2020 Prognosis for Safe Diet Advancement Good Barriers to Reach Goals Cognitive deficits Barriers/Prognosis Comment -- CHL IP DIET RECOMMENDATION 10/26/2020 SLP Diet Recommendations Dysphagia 2 (Fine chop) solids;Thin liquid Liquid Administration via Cup;No straw Medication Administration Crushed with puree Compensations Slow rate;Small sips/bites Postural Changes Seated upright at 90 degrees   CHL IP OTHER RECOMMENDATIONS 10/26/2020 Recommended Consults -- Oral Care Recommendations Oral care BID Other Recommendations --   CHL IP FOLLOW UP RECOMMENDATIONS 10/26/2020 Follow up Recommendations Skilled Nursing facility   [facility] IP FREQUENCY AND DURATION 10/26/2020 Speech Therapy Frequency (ACUTE ONLY) min 2x/week Treatment Duration 2 weeks      CHL IP ORAL PHASE 10/26/2020 Oral Phase Impaired Oral - Pudding Teaspoon -- Oral - Pudding Cup -- Oral - Honey Teaspoon -- Oral - Honey Cup --  Oral - Nectar Teaspoon -- Oral - Nectar Cup -- Oral - Nectar Straw Reduced posterior propulsion;Holding of bolus;Lingual pumping Oral - Thin Teaspoon -- Oral - Thin Cup Reduced posterior propulsion;Holding of bolus;Lingual pumping;Delayed oral transit Oral - Thin Straw Reduced posterior propulsion;Holding of bolus;Lingual pumping;Delayed oral transit Oral - Puree Reduced posterior propulsion;Holding of bolus;Lingual pumping;Delayed oral transit Oral - Mech Soft -- Oral - Regular Reduced posterior propulsion;Holding of bolus;Lingual pumping;Delayed oral transit Oral - Multi-Consistency -- Oral - Pill Reduced posterior propulsion;Holding of bolus;Lingual pumping;Delayed oral transit Oral Phase - Comment --  CHL IP PHARYNGEAL PHASE 10/26/2020 Pharyngeal Phase Impaired Pharyngeal- Pudding Teaspoon -- Pharyngeal -- Pharyngeal- Pudding Cup -- Pharyngeal -- Pharyngeal- Honey Teaspoon -- Pharyngeal -- Pharyngeal- Honey Cup -- Pharyngeal -- Pharyngeal- Nectar Teaspoon -- Pharyngeal -- Pharyngeal- Nectar Cup -- Pharyngeal -- Pharyngeal- Nectar Straw Delayed swallow initiation-vallecula;Delayed swallow initiation-pyriform sinuses Pharyngeal -- Pharyngeal- Thin Teaspoon -- Pharyngeal -- Pharyngeal- Thin Cup Delayed swallow initiation-vallecula;Penetration/Aspiration during swallow Pharyngeal Material enters airway, remains ABOVE vocal cords then ejected out Pharyngeal- Thin Straw Delayed swallow initiation-vallecula;Penetration/Aspiration during swallow Pharyngeal Material enters airway, remains ABOVE vocal cords then ejected out Pharyngeal- Puree Delayed swallow initiation-vallecula Pharyngeal -- Pharyngeal- Mechanical Soft -- Pharyngeal -- Pharyngeal- Regular Delayed swallow initiation-vallecula Pharyngeal -- Pharyngeal- Multi-consistency -- Pharyngeal -- Pharyngeal- Pill Delayed swallow initiation-vallecula Pharyngeal -- Pharyngeal Comment --  CHL IP CERVICAL ESOPHAGEAL PHASE 10/26/2020 Cervical Esophageal Phase  WFL Pudding  Teaspoon -- Pudding Cup -- Honey Teaspoon -- Honey Cup -- Nectar Teaspoon -- Nectar Cup -- Nectar Straw -- Thin Teaspoon -- Thin Cup -- Thin Straw -- Puree -- Mechanical Soft -- Regular -- Multi-consistency -- Pill -- Cervical Esophageal Comment -- Shanika I. Hardin Negus, [place], [place] Office number [phone] Pager [phone] Horton Marshall 10/26/2020, 1:30 PM               Scheduled Meds: . aspirin EC  81 mg Oral Daily  . atorvastatin  80 mg Oral q1800  . clopidogrel  75 mg Oral Daily  . donepezil  5 mg Oral QHS  . enoxaparin (LOVENOX) injection  40 mg Subcutaneous Q24H  . levETIRAcetam  750 mg Oral BID  . melatonin  3 mg Oral QHS  . multivitamin with minerals  1 tablet Oral Daily  . sodium chloride flush  3 mL Intravenous Once   Continuous Infusions:  Active Problems:   COPD (chronic obstructive pulmonary disease) (HCC)   Acute ischemic stroke (HCC)   Stroke (cerebrum) (HCC)   Malnutrition of moderate degree   Hospice care patient   Physical deconditioning   Dysphagia, pharyngoesophageal phase   Consultants:  Neurology  Palliative medicine  Procedures:  2D echocardiogram  Antibiotics:   Anti-infectives (From admission, onward)   None       Time spent: 20 minutes    [name] ANP  [facility] 7 am - 330 pm/M-F for direct patient care and secure chat Please refer to Amion for contact info 26  days

## 2020-10-27 NOTE — Progress Notes (Signed)
Physical Therapy Treatment Patient Details Name: Kristy Cabrera MRN: [id number] DOB: [birth year] Today's Date: 10/27/2020    History of Present Illness 70 y.o. female presented 3/13 with new onset of left facial droop, left-sided weakness and slurred speech. CT of the head showed acute stroke on right insula and operculum PMH:  advanced dementia, HTN, meningioma s/p resection, seizure, unspecified left MCA stroke s/p left-sided endarterectomy in 2020, HLD, currently enrolled in home hospice    PT Comments    Pt tolerates treatment well but continues to demonstrate significant deficits in strength, power, balance, endurance, safety awareness. Pt requires significant assistance to stand due to LE weakness, and is reliant on PT facilitation to guide mobility and safely transfer and ambulate. Pt will benefit from continued acute PT services to reduce falls risk and caregiver burden. PT continues to recommend SNF, however pt has been denied SNF by insurance. Pt will then benefit from HHPT services, a hospital bed, manual wheelchair, and mechanical lift, as well as significant physical assistance for all OOB mobility other than when transferring with mechanical lift.  Follow Up Recommendations  SNF;Home health PT (pt denied SNF placement by insurance. If D/C home pt will benefit from HHPT)     Equipment Recommendations  Wheelchair (measurements PT);Wheelchair cushion (measurements PT);Hospital bed (mechanical lift)    Recommendations for Other Services       Precautions / Restrictions Precautions Precautions: Fall Precaution Comments: advanced dementia Restrictions Weight Bearing Restrictions: No    Mobility  Bed Mobility Overal bed mobility: Needs Assistance Bed Mobility: Supine to Sit     Supine to sit: Mod assist;HOB elevated     General bed mobility comments: PT assists LE to edge of bed and provides BUE support for pt to elevate trunk into sitting    Transfers Overall transfer  level: Needs assistance Equipment used: 1 person hand held assist;Rolling walker (2 wheeled) Transfers: Sit to/from Stand Sit to Stand: Max assist Stand pivot transfers: Max assist;Mod assist (maxA to elevate into standing, modA once standing to pivot with tactile cues to facilitate direction of steps)          Ambulation/Gait Ambulation/Gait assistance: Mod assist Gait Distance (Feet): 10 Feet (additional trial of 4') Assistive device: Rolling walker (2 wheeled) Gait Pattern/deviations: Shuffle;Decreased step length - left;Decreased dorsiflexion - right;Decreased dorsiflexion - left;Decreased weight shift to left;Decreased weight shift to right Gait velocity: reduced Gait velocity interpretation: <1.31 ft/sec, indicative of household ambulator General Gait Details: pt with shuffling steps, reduced foot clearance, PT attempts to provide weight shift to aide in foot clearance and improve step length.   Stairs             Wheelchair Mobility    Modified Rankin (Stroke Patients Only) Modified Rankin (Stroke Patients Only) Pre-Morbid Rankin Score: Moderate disability Modified Rankin: Moderately severe disability     Balance Overall balance assessment: Needs assistance Sitting-balance support: Feet supported;No upper extremity supported Sitting balance-Leahy Scale: Fair     Standing balance support: Bilateral upper extremity supported Standing balance-Leahy Scale: Poor Standing balance comment: minA with BUE support of RW                            Cognition Arousal/Alertness: Awake/alert Behavior During Therapy: Impulsive Overall Cognitive Status: No family/caregiver present to determine baseline cognitive functioning  General Comments: pt with increased time to follow simple one step commands. Pt is inconsistent at times when following commands. Pt with impaired safety awareness and reduced knowledge of  deficits      Exercises      General Comments General comments (skin integrity, edema, etc.): VSS on RA, pt reports need to urinate and is assisted to bathroom, when sitting on toilet pt does not urinate and reports completion of urination.      Pertinent Vitals/Pain Pain Assessment: Faces Pain Score: 2  Pain Location: intermittent grimacing with transfers and bed mobility Pain Descriptors / Indicators: Grimacing Pain Intervention(s): Monitored during session    Home Living                      Prior Function            PT Goals (current goals can now be found in the care plan section) Acute Rehab PT Goals Patient Stated Goal: to go to bathroom PT Goal Formulation: Patient unable to participate in goal setting Time For Goal Achievement: 11/10/20 Potential to Achieve Goals: Fair Progress towards PT goals: Progressing toward goals (very slowly)    Frequency    Min 3X/week      PT Plan Current plan remains appropriate    Co-evaluation              AM-PAC PT "6 Clicks" Mobility   Outcome Measure  Help needed turning from your back to your side while in a flat bed without using bedrails?: A Lot Help needed moving from lying on your back to sitting on the side of a flat bed without using bedrails?: A Lot Help needed moving to and from a bed to a chair (including a wheelchair)?: A Lot Help needed standing up from a chair using your arms (e.g., wheelchair or bedside chair)?: A Lot Help needed to walk in hospital room?: A Lot Help needed climbing 3-5 steps with a railing? : Total 6 Click Score: 11    End of Session Equipment Utilized During Treatment: Gait belt Activity Tolerance: Patient tolerated treatment well Patient left: in chair;with call bell/phone within reach;with chair alarm set Nurse Communication: Mobility status PT Visit Diagnosis: Muscle weakness (generalized) (M62.81);Difficulty in walking, not elsewhere classified (R26.2);Other  abnormalities of gait and mobility (R26.89)     Time: 5038-8828 PT Time Calculation (min) (ACUTE ONLY): 45 min  Charges:  $Gait Training: 8-22 mins $Therapeutic Activity: 23-37 mins                     Zenaida Niece, PT, DPT Acute Rehabilitation Pager: [phone]    Zenaida Niece 10/27/2020, 3:59 PM

## 2020-10-27 NOTE — Plan of Care (Signed)
Patient resting comfortably without any distress. Worked with PT. Up and in chair for couple of hours. No CO pain or discomfort. Medication for hypertension started. Bed kept in low position and locked. Call bell in reach. No further complains at this time.  Problem: Education: Goal: Knowledge of General Education information will improve Description: Including pain rating scale, medication(s)/side effects and non-pharmacologic comfort measures Outcome: Progressing   Problem: Health Behavior/Discharge Planning: Goal: Ability to manage health-related needs will improve Outcome: Progressing   Problem: Clinical Measurements: Goal: Ability to maintain clinical measurements within normal limits will improve Outcome: Progressing Goal: Will remain free from infection Outcome: Progressing Goal: Diagnostic test results will improve Outcome: Progressing Goal: Respiratory complications will improve Outcome: Progressing Goal: Cardiovascular complication will be avoided Outcome: Progressing   Problem: Activity: Goal: Risk for activity intolerance will decrease Outcome: Progressing   Problem: Nutrition: Goal: Adequate nutrition will be maintained Outcome: Progressing   Problem: Coping: Goal: Level of anxiety will decrease Outcome: Progressing   Problem: Elimination: Goal: Will not experience complications related to bowel motility Outcome: Progressing Goal: Will not experience complications related to urinary retention Outcome: Progressing   Problem: Pain Managment: Goal: General experience of comfort will improve Outcome: Progressing   Problem: Safety: Goal: Ability to remain free from injury will improve Outcome: Progressing   Problem: Skin Integrity: Goal: Risk for impaired skin integrity will decrease Outcome: Progressing   Problem: Education: Goal: Knowledge of disease or condition will improve Outcome: Progressing Goal: Knowledge of secondary prevention will  improve Outcome: Progressing Goal: Knowledge of patient specific risk factors addressed and post discharge goals established will improve Outcome: Progressing Goal: Individualized Educational Video(s) Outcome: Progressing   Problem: Coping: Goal: Will verbalize positive feelings about self Outcome: Progressing Goal: Will identify appropriate support needs Outcome: Progressing   Problem: Health Behavior/Discharge Planning: Goal: Ability to manage health-related needs will improve Outcome: Progressing   Problem: Self-Care: Goal: Ability to participate in self-care as condition permits will improve Outcome: Progressing Goal: Verbalization of feelings and concerns over difficulty with self-care will improve Outcome: Progressing Goal: Ability to communicate needs accurately will improve Outcome: Progressing   Problem: Nutrition: Goal: Risk of aspiration will decrease Outcome: Progressing Goal: Dietary intake will improve Outcome: Progressing   Problem: Ischemic Stroke/TIA Tissue Perfusion: Goal: Complications of ischemic stroke/TIA will be minimized Outcome: Progressing

## 2020-10-28 LAB — BASIC METABOLIC PANEL
Anion gap: 7 (ref 5–15)
BUN: 23 mg/dL (ref 8–23)
CO2: 24 mmol/L (ref 22–32)
Calcium: 9.4 mg/dL (ref 8.9–10.3)
Chloride: 110 mmol/L (ref 98–111)
Creatinine, Ser: 0.7 mg/dL (ref 0.44–1.00)
GFR, Estimated: >60 mL/min (ref >60)
Glucose, Bld: 93 mg/dL (ref 70–99)
Potassium: 3.8 mmol/L (ref 3.5–5.1)
Sodium: 141 mmol/L (ref 135–145)

## 2020-10-28 LAB — CBC
HCT: 39.6 % (ref 36.0–46.0)
Hemoglobin: 13 g/dL (ref 12.0–15.0)
MCH: 29.7 pg (ref 26.0–34.0)
MCHC: 32.8 g/dL (ref 30.0–36.0)
MCV: 90.6 fL (ref 80.0–100.0)
Platelets: 344 10*3/uL (ref 150–400)
RBC: 4.37 MIL/uL (ref 3.87–5.11)
RDW: 12.5 % (ref 11.5–15.5)
WBC: 10.1 10*3/uL (ref 4.0–10.5)
nRBC: 0 % (ref 0.0–0.2)

## 2020-10-28 NOTE — Progress Notes (Signed)
[facility] PROGRESS NOTE  Kristy Cabrera VFI:[id number] DOB: [birth year] DOA: 10/01/2020 PCP: [name], NP  Status: Remains inpatient appropriate because:Altered mental status, Unsafe d/c plan and Inpatient level of care appropriate due to severity of illness   Dispo: The patient is from: Home              Anticipated d/c is to: SNF; still lacks appropriate funding.  Medicaid application pending in [facility] with [name] [phone]; we also are waiting to determine if LOG assistance would be available.  This is likely facility dependent.              Patient currently is medically stable to d/c.   Difficult to place patient Yes   Level of care: Med-Surg  Code Status: DNR Family Communication: None at bedside DVT prophylaxis: Lovenox Vaccination status: Unknown  Foley catheter: Purewick  HPI: 70 y.o.femalewith medical history significant ofadvanced dementia, HTN, meningioma s/p resection, seizure, unspecified left MCA stroke s/pleft-sided endarterectomy in 2020, HLD, currently enrolled in home hospice presented with new onset of left facial droop, left-sided weakness and slurred speech. On presentation code stroke was called. CT of the head showed acute stroke on right insula and operculum. Neurology was consulted.  PT recommended SNF   Subjective: Denies any complaints, no significant events overnight  Objective: Vitals:   10/27/20 2148 10/28/20 0622  BP: (!) 156/68 140/76  Pulse: 75 (!) 57  Resp: 16 16  Temp: 97.9 F (36.6 C) 98.3 F (36.8 C)  SpO2: 95% 98%    Intake/Output Summary (Last 24 hours) at 10/28/2020 1212 Last data filed at 10/28/2020 0109 Gross per 24 hour  Intake --  Output 200 ml  Net -200 ml   Filed Weights   10/01/20 1338  Weight: 64 kg    Exam:   Awake Alert, follow commands, cooperative, with significant aphasia symmetrical Chest wall movement, Good air movement bilaterally, CTAB RRR,No Gallops,Rubs or new  Murmurs, No Parasternal Heave +ve B.Sounds, Abd Soft, No tenderness, No rebound - guarding or rigidity. No Cyanosis, Clubbing or edema, No new Rash or bruise     Assessment/Plan: Acute problems: Acute right-sided stroke, right MCA territory -Continues to have slurred speech and left-sided facial droop.CT of the head positve for CVA andMRI of brain showed multiple small acute right MCA territory infarcts. CTA w/ new proximal right M2 occlusion with distal reconstitution, severe intracranial atherosclerosis, unchanged 50% mid right common carotid artery stenosis.Left carotid endarterectomy patent without recurrent stenosis -Had stroke in April 2020 prior to carotid endarterectomy that resulted in severe aphasia which apparently had improved prior to current stroke. -Aspirin, statin, Plavix per neurology -Echo shows EF of 55 to 60% with grade 1 diastolic dysfunction. -A1c 5.7 -LDL 153 and cholesterol 220 -PT/OT- SNF recommended  Uncontrolled hypertension -SBP in the 180-1 90 range today -Was on Cozaar and Norvasc at home-these medications will be resumed as of 4/8 -Also has as needed hydralazine available if needed for breakthrough hypertension as meds are being adjusted.  Parameters decreased to administer for SBP >/= 180 noting previous parameters for in the initial stroke admission ranges  Advanced dementia -Continue Aricept. -Resume home Namenda -Fall and delirium precautions.   Moderate protein calorie malnutrition  Nutrition Problem: Moderate Malnutrition Etiology: chronic illness (advanced dementia; s/p R MCA in 2020, declined since) Signs/Symptoms: moderate muscle depletion,mild muscle depletion,mild fat depletion Interventions: Ensure Enlive (each supplement provides 350kcal and 20 grams of protein),Magic cup,MVI Estimated body mass index is 24.2 kg/m as calculated  from the following:   Height as of this encounter: 5\' 4"  (1.626 m).   Weight as of this encounter: 64  kg. -Continue supplements -Eats better if foods are placed in a bowl where she can use a spoon to scoop up more easily -Reevaluated by SLP on 4/7 w/ MBSS  recommended contine D2 diet and thin liquids    Other problems:  Seizure disorder -Continue p.o. Keppra  Hyperlipidemia -Continue statin   Data Reviewed: Basic Metabolic Panel: Recent Labs  Lab 10/28/20 0221  NA 141  K 3.8  CL 110  CO2 24  GLUCOSE 93  BUN 23  CREATININE 0.70  CALCIUM 9.4   Liver Function Tests: No results for input(s): AST, ALT, ALKPHOS, BILITOT, PROT, ALBUMIN in the last 168 hours. No results for input(s): LIPASE, AMYLASE in the last 168 hours. No results for input(s): AMMONIA in the last 168 hours. CBC: Recent Labs  Lab 10/28/20 0221  WBC 10.1  HGB 13.0  HCT 39.6  MCV 90.6  PLT 344   Cardiac Enzymes: No results for input(s): CKTOTAL, CKMB, CKMBINDEX, TROPONINI in the last 168 hours. BNP (last 3 results) No results for input(s): BNP in the last 8760 hours.  ProBNP (last 3 results) No results for input(s): PROBNP in the last 8760 hours.  CBG: No results for input(s): GLUCAP in the last 168 hours.  No results found for this or any previous visit (from the past 240 hour(s)).   Studies: No results found.  Scheduled Meds: . amLODipine  5 mg Oral Daily  . aspirin EC  81 mg Oral Daily  . atorvastatin  80 mg Oral q1800  . clopidogrel  75 mg Oral Daily  . donepezil  5 mg Oral QHS  . enoxaparin (LOVENOX) injection  40 mg Subcutaneous Q24H  . levETIRAcetam  750 mg Oral BID  . losartan  25 mg Oral Daily  . melatonin  3 mg Oral QHS  . memantine  10 mg Oral Daily  . multivitamin with minerals  1 tablet Oral Daily  . sodium chloride flush  3 mL Intravenous Once   Continuous Infusions:  Active Problems:   COPD (chronic obstructive pulmonary disease) (HCC)   Acute ischemic stroke (HCC)   Stroke (cerebrum) (HCC)   Malnutrition of moderate degree   Hospice care patient   Physical  deconditioning   Dysphagia, pharyngoesophageal phase   Consultants:  Neurology  Palliative medicine  Procedures:  2D echocardiogram  Antibiotics:   Anti-infectives (From admission, onward)   None        Phillips Climes MD  [facility] 7 am - 330 pm/M-F for direct patient care and secure chat Please refer to Amion for contact info 27  days

## 2020-10-29 MED ORDER — ENSURE ENLIVE PO LIQD
237.0000 mL | Freq: Three times a day (TID) | ORAL | Status: DC
  Administered 2020-10-30 – 2020-11-10 (×23): 237 mL via ORAL
  Filled 2020-10-29 (×2): qty 237

## 2020-10-29 NOTE — Progress Notes (Signed)
[facility] PROGRESS NOTE  Kristy Cabrera ZOX:[id number] DOB: [birth year] DOA: 10/01/2020 PCP: [name], NP  Status: Remains inpatient appropriate because:Altered mental status, Unsafe d/c plan and Inpatient level of care appropriate due to severity of illness   Dispo: The patient is from: Home              Anticipated d/c is to: SNF; still lacks appropriate funding.  Medicaid application pending in [facility] with [name] [phone]; we also are waiting to determine if LOG assistance would be available.  This is likely facility dependent.              Patient currently is medically stable to d/c.   Difficult to place patient Yes   Level of care: Med-Surg  Code Status: DNR Family Communication: None at bedside DVT prophylaxis: Lovenox Vaccination status: Unknown  Foley catheter: Purewick  HPI: 70 y.o.femalewith medical history significant ofadvanced dementia, HTN, meningioma s/p resection, seizure, unspecified left MCA stroke s/pleft-sided endarterectomy in 2020, HLD, currently enrolled in home hospice presented with new onset of left facial droop, left-sided weakness and slurred speech. On presentation code stroke was called. CT of the head showed acute stroke on right insula and operculum. Neurology was consulted.  PT recommended SNF   Subjective: Denies any complaints, no significant events overnight  Objective: Vitals:   10/28/20 2136 10/29/20 0419  BP: 121/67 139/68  Pulse: 65 65  Resp: 18 16  Temp: 98.7 F (37.1 C) 98.3 F (36.8 C)  SpO2: 94% 100%    Intake/Output Summary (Last 24 hours) at 10/29/2020 1057 Last data filed at 10/29/2020 0600 Gross per 24 hour  Intake --  Output 200 ml  Net -200 ml   Filed Weights   10/01/20 1338  Weight: 64 kg    Exam:   Awake, alert, cooperative, pleasant, with significant aphasia  Entry bilaterally  Rhythm, no rubs or gallops  Abd  nontender No Cyanosis, Clubbing or edema, No new Rash or  bruise     Assessment/Plan: Acute problems: Acute right-sided stroke, right MCA territory -Continues to have slurred speech and left-sided facial droop.CT of the head positve for CVA andMRI of brain showed multiple small acute right MCA territory infarcts. CTA w/ new proximal right M2 occlusion with distal reconstitution, severe intracranial atherosclerosis, unchanged 50% mid right common carotid artery stenosis.Left carotid endarterectomy patent without recurrent stenosis -Had stroke in April 2020 prior to carotid endarterectomy that resulted in severe aphasia which apparently had improved prior to current stroke. -Aspirin, statin, Plavix per neurology -Echo shows EF of 55 to 60% with grade 1 diastolic dysfunction. -A1c 5.7 -LDL 153 and cholesterol 220 -PT/OT- SNF recommended  Uncontrolled hypertension -SBP in the 180-1 90 range today -Was on Cozaar and Norvasc at home-these medications will be resumed as of 4/8 -Also has as needed hydralazine available if needed for breakthrough hypertension as meds are being adjusted.  Parameters decreased to administer for SBP >/= 180 noting previous parameters for in the initial stroke admission ranges  Advanced dementia -Continue Aricept. -Resume home Namenda -Fall and delirium precautions.   Moderate protein calorie malnutrition  Nutrition Problem: Moderate Malnutrition Etiology: chronic illness (advanced dementia; s/p R MCA in 2020, declined since) Signs/Symptoms: moderate muscle depletion,mild muscle depletion,mild fat depletion Interventions: Ensure Enlive (each supplement provides 350kcal and 20 grams of protein),Magic cup,MVI Estimated body mass index is 24.2 kg/m as calculated from the following:   Height as of this encounter: 5\' 4"  (1.626 m).   Weight as of  this encounter: 64 kg. -Continue supplements -Eats better if foods are placed in a bowl where she can use a spoon to scoop up more easily -Reevaluated by SLP on 4/7 w/ MBSS   recommended contine D2 diet and thin liquids    Other problems:  Seizure disorder -Continue p.o. Keppra  Hyperlipidemia -Continue statin   Data Reviewed: Basic Metabolic Panel: Recent Labs  Lab 10/28/20 0221  NA 141  K 3.8  CL 110  CO2 24  GLUCOSE 93  BUN 23  CREATININE 0.70  CALCIUM 9.4   Liver Function Tests: No results for input(s): AST, ALT, ALKPHOS, BILITOT, PROT, ALBUMIN in the last 168 hours. No results for input(s): LIPASE, AMYLASE in the last 168 hours. No results for input(s): AMMONIA in the last 168 hours. CBC: Recent Labs  Lab 10/28/20 0221  WBC 10.1  HGB 13.0  HCT 39.6  MCV 90.6  PLT 344   Cardiac Enzymes: No results for input(s): CKTOTAL, CKMB, CKMBINDEX, TROPONINI in the last 168 hours. BNP (last 3 results) No results for input(s): BNP in the last 8760 hours.  ProBNP (last 3 results) No results for input(s): PROBNP in the last 8760 hours.  CBG: No results for input(s): GLUCAP in the last 168 hours.  No results found for this or any previous visit (from the past 240 hour(s)).   Studies: No results found.  Scheduled Meds: . amLODipine  5 mg Oral Daily  . aspirin EC  81 mg Oral Daily  . atorvastatin  80 mg Oral q1800  . clopidogrel  75 mg Oral Daily  . donepezil  5 mg Oral QHS  . enoxaparin (LOVENOX) injection  40 mg Subcutaneous Q24H  . levETIRAcetam  750 mg Oral BID  . losartan  25 mg Oral Daily  . melatonin  3 mg Oral QHS  . memantine  10 mg Oral Daily  . multivitamin with minerals  1 tablet Oral Daily  . sodium chloride flush  3 mL Intravenous Once   Continuous Infusions:  Active Problems:   COPD (chronic obstructive pulmonary disease) (HCC)   Acute ischemic stroke (HCC)   Stroke (cerebrum) (HCC)   Malnutrition of moderate degree   Hospice care patient   Physical deconditioning   Dysphagia, pharyngoesophageal phase   Consultants:  Neurology  Palliative medicine  Procedures:  2D  echocardiogram  Antibiotics:   Anti-infectives (From admission, onward)   None        Phillips Climes MD  [facility] 7 am - 330 pm/M-F for direct patient care and secure chat Please refer to Amion for contact info 28  days

## 2020-10-29 NOTE — Progress Notes (Addendum)
Nutrition Follow-up  DOCUMENTATION CODES:   Non-severe (moderate) malnutrition in context of chronic illness  INTERVENTION:  -Please obtain new weight -Requested bowl be sent with all meals  -Assist pt with meals  -Ensure Enlive po TID, each supplement provides 350 kcal and 20 grams of protein -d/c Mighty Shake -Continue double protein portions with meals -Continue MVI with minerals daily  NUTRITION DIAGNOSIS:  Moderate Malnutrition related to chronic illness (advanced dementia; s/p R MCA in 2020, declined since) as evidenced by moderate muscle depletion,mild muscle depletion,mild fat depletion. - ongoing  GOAL:  Patient will meet greater than or equal to 90% of their needs - progressing  MONITOR:  PO intake,Supplement acceptance  REASON FOR ASSESSMENT:  Consult Assessment of nutrition requirement/status  ASSESSMENT:  70 yo female with a PMH of advanced dementia, HTN, meningioma s/p resection, seizure, left MCA stroke s/p L-sided endarterectomy in 2020, and HLD who presents with acute R-sided stroke.  3/14 dysphagia 1 diet with thins ordered 3/15 diet advanced to regular with thin liquids 3/21 diet downgraded to regular with nectar thick liquids  4/06 diet downgraded to dysphagia 1 with nectar thick liquids  4/07 diet advanced to dysphagia 2 with thin liquids 4/08 pt developed uncontrolled HTN; home BP medications resumed w/ PRN hydralazine  Pt continues to have significant aphasia.   There has been limited meal documentation since last RD visit. Per RN, pt ate 25% of breakfast today and 100% of lunch. RN reports pt has been having some difficulty utilizing a fork and states that pt has done better with meals when placed in bowls. Pt doing well with supplements. Will transition pt to Ensure now that diet has been advanced to thin liquids given Ensure provides more kcals/protein than Mighty Shake. Will also order feeding assistance.   UOP: 213ml documented x24 hours   Pt  has not been weighed since admit. NEED NEW WEIGHT   Medications reviewed and include MVI with minerals daily. Labs reviewed.   Diet Order:   Diet Order            DIET DYS 2 Room service appropriate? No; Fluid consistency: Thin  Diet effective now           Diet - low sodium heart healthy                 EDUCATION NEEDS:   No education needs have been identified at this time  Skin:  Skin Assessment: Reviewed RN Assessment  Last BM:  4/4  Height:   Ht Readings from Last 1 Encounters:  10/01/20 5\' 4"  (1.626 m)    Weight:   Wt Readings from Last 1 Encounters:  10/01/20 64 kg    Ideal Body Weight:  54.5 kg  BMI:  Body mass index is 24.2 kg/m.  Estimated Nutritional Needs:   Kcal:  1600-1800  Protein:  80-95 grams  Fluid:  >1.6 L    Larkin Ina, MS, RD, LDN RD pager number and weekend/on-call pager number located in [place].

## 2020-10-30 DIAGNOSIS — I10 Essential (primary) hypertension: Secondary | ICD-10-CM

## 2020-10-30 LAB — URINALYSIS, ROUTINE W REFLEX MICROSCOPIC
Bilirubin Urine: NEGATIVE
Glucose, UA: NEGATIVE mg/dL
Hgb urine dipstick: NEGATIVE
Ketones, ur: NEGATIVE mg/dL
Nitrite: POSITIVE — AB
Protein, ur: NEGATIVE mg/dL
Specific Gravity, Urine: 1.025 (ref 1.005–1.030)
WBC, UA: >50 WBC/hpf — ABNORMAL HIGH (ref 0–5)
pH: 5 (ref 5.0–8.0)

## 2020-10-30 MED ORDER — SERTRALINE HCL 25 MG PO TABS
25.0000 mg | ORAL_TABLET | Freq: Every day | ORAL | Status: DC
  Administered 2020-10-30 – 2020-11-10 (×12): 25 mg via ORAL
  Filled 2020-10-30 (×12): qty 1

## 2020-10-30 NOTE — Progress Notes (Signed)
CSW attempted to reach patient's Medicaid worker [place] [phone]) at [place] without success - a voicemail was left requesting a return call.  Madilyn Fireman, MSW, LCSW Transitions of Care  Clinical Social Worker II [phone]

## 2020-10-30 NOTE — Progress Notes (Addendum)
[facility] PROGRESS NOTE  Kristy Cabrera OEV:[id number] DOB: Apr 11, 1951 DOA: 10/01/2020 PCP: [name], NP  Status: Remains inpatient appropriate because:Altered mental status, Unsafe d/c plan and Inpatient level of care appropriate due to severity of illness   Dispo: The patient is from: Home              Anticipated d/c is to: SNF; still lacks appropriate funding.  Medicaid application pending in [facility] with [name] [phone]; we also are waiting to determine if LOG assistance would be available.  This is likely facility dependent.              Patient currently is medically stable to d/c.   Difficult to place patient Yes   Level of care: Med-Surg  Code Status: DNR Family Communication: 4/7 Husband [name] updated DVT prophylaxis: Lovenox Vaccination status: Unknown  Foley catheter: Purewick  HPI: 70 y.o.femalewith medical history significant ofadvanced dementia, HTN, meningioma s/p resection, seizure, unspecified left MCA stroke s/pleft-sided endarterectomy in 2020, HLD, currently enrolled in home hospice presented with new onset of left facial droop, left-sided weakness and slurred speech. On presentation code stroke was called. CT of the head showed acute stroke on right insula and operculum. Neurology was consulted.  PT recommended SNF   Subjective: Confirms sleep.  Denies any complaints.  Able to confirm with patient that she does not like the texture of the D2 diet.  Prior to my examining patient she asked me if it was "okay if I sleep".  Objective: Vitals:   10/29/20 2012 10/30/20 0434  BP: (!) 127/45 106/61  Pulse: 66 (!) 59  Resp: 20 16  Temp: 97.6 F (36.4 C) 98.4 F (36.9 C)  SpO2: 94% 94%    Intake/Output Summary (Last 24 hours) at 10/30/2020 0736 Last data filed at 10/29/2020 1200 Gross per 24 hour  Intake 960 ml  Output --  Net 960 ml   Filed Weights   10/01/20 1338  Weight: 64 kg    Exam: General: Awakened,  pleasant, comfortable Respiratory: Anterior lung sounds clear, room air, no increased work of breathing or coughing Cardiovascular: S1-S2, no peripheral edema, pulse regular Abdomen:  LBM 4/4, soft nontender nondistended.  D2 diet. Neurologic: CN 2-12 grossly intact except for L facial droop-continues with aphasia.  Left upper extremity strength about 1-2/5 with right upper extremity strength about 3-4/5.  Sensation intact. Psychiatric: Awake.  Oriented to name and place.  Poor orientation difficult to ascertain given ongoing issues with aphasia.  Assessment/Plan: Acute problems: Acute right-sided stroke, right MCA territory -Continues to have slurred speech and left-sided facial droop.CT of the head positve for CVA andMRI of brain showed multiple small acute right MCA territory infarcts. CTA w/ new proximal right M2 occlusion with distal reconstitution, severe intracranial atherosclerosis, unchanged 50% mid right common carotid artery stenosis.Left carotid endarterectomy patent without recurrent stenosis -Had stroke in April 2020 prior to carotid endarterectomy that resulted in severe aphasia which apparently had improved prior to current stroke. -Aspirin, statin, Plavix per neurology -Echo shows EF of 55 to 60% with grade 1 diastolic dysfunction. -A1c 5.7 -LDL 153 and cholesterol 220 -PT/OT- SNF recommended -SLP documenting decline inpatient status today she asked me if she could sleep.  Suspect underlying depression from prolonged hospitalization.  We will begin low-dose Zoloft.  We will also check urinalysis and culture to rule out occult UTI.  Hypertension -SBP improved after resumption of home medications 4/8 -Continue Cozaar and Norvasc    Advanced dementia -Continue Aricept  and Namenda -Fall and delirium precautions.   Moderate protein calorie malnutrition  Nutrition Problem: Moderate Malnutrition Etiology: chronic illness (advanced dementia; s/p R MCA in 2020, declined  since) Signs/Symptoms: moderate muscle depletion,mild muscle depletion,mild fat depletion Interventions: Ensure Enlive (each supplement provides 350kcal and 20 grams of protein),Magic cup,MVI Estimated body mass index is 24.2 kg/m as calculated from the following:   Height as of this encounter: 5\' 4"  (1.626 m).   Weight as of this encounter: 64 kg. -Continue supplements -Eats better if foods are placed in a bowl where she can use a spoon to scoop up more easily -Reevaluated by SLP on 4/7 w/ MBSS  recommended contine D2 diet and thin liquids    Other problems:  Seizure disorder -Continue p.o. Keppra  Hyperlipidemia -Continue statin   Data Reviewed: Basic Metabolic Panel: Recent Labs  Lab 10/28/20 0221  NA 141  K 3.8  CL 110  CO2 24  GLUCOSE 93  BUN 23  CREATININE 0.70  CALCIUM 9.4   Liver Function Tests: No results for input(s): AST, ALT, ALKPHOS, BILITOT, PROT, ALBUMIN in the last 168 hours. No results for input(s): LIPASE, AMYLASE in the last 168 hours. No results for input(s): AMMONIA in the last 168 hours. CBC: Recent Labs  Lab 10/28/20 0221  WBC 10.1  HGB 13.0  HCT 39.6  MCV 90.6  PLT 344   Cardiac Enzymes: No results for input(s): CKTOTAL, CKMB, CKMBINDEX, TROPONINI in the last 168 hours. BNP (last 3 results) No results for input(s): BNP in the last 8760 hours.  ProBNP (last 3 results) No results for input(s): PROBNP in the last 8760 hours.  CBG: No results for input(s): GLUCAP in the last 168 hours.  No results found for this or any previous visit (from the past 240 hour(s)).   Studies: No results found.  Scheduled Meds: . amLODipine  5 mg Oral Daily  . aspirin EC  81 mg Oral Daily  . atorvastatin  80 mg Oral q1800  . clopidogrel  75 mg Oral Daily  . donepezil  5 mg Oral QHS  . enoxaparin (LOVENOX) injection  40 mg Subcutaneous Q24H  . feeding supplement  237 mL Oral TID BM  . levETIRAcetam  750 mg Oral BID  . losartan  25 mg Oral  Daily  . melatonin  3 mg Oral QHS  . memantine  10 mg Oral Daily  . multivitamin with minerals  1 tablet Oral Daily  . sodium chloride flush  3 mL Intravenous Once   Continuous Infusions:  Active Problems:   COPD (chronic obstructive pulmonary disease) (HCC)   Acute ischemic stroke (HCC)   Stroke (cerebrum) (HCC)   Malnutrition of moderate degree   Hospice care patient   Physical deconditioning   Dysphagia, pharyngoesophageal phase   Consultants:  Neurology  Palliative medicine  Procedures:  2D echocardiogram  Antibiotics:   Anti-infectives (From admission, onward)   None       Time spent: 20 minutes    [name] ANP  [facility] 7 am - 330 pm/M-F for direct patient care and secure chat Please refer to Amion for contact info 29  days

## 2020-10-30 NOTE — Progress Notes (Signed)
  Speech Language Pathology Treatment: Dysphagia  Patient Details Name: Kristy Cabrera MRN: [id number] DOB: September 12, 1950 Today's Date: 10/30/2020 Time: 0922-0939 SLP Time Calculation (min) (ACUTE ONLY): 17.8 min  Assessment / Plan / Recommendation Clinical Impression  Pt was seen for dysphagia treatment. She was alert and cooperative during the session. Verbal output was limited and multiple open-ended questions were responded to with vocalization only. Pt was seen during breakfast with a meal which included applesauce, ground sausage, scrambled eggs and thin liquids via cup.  Pt continues to exhibit oral holding with solids and liquids. Pt required frequent cueing for mastication and for oral clearance. Pt demonstrated coughing once when she attempted to speak with a bolus of thin liquids and partially masticated solids, but no other signs of aspiration were noted. Pt's oral hold with puree and dysphagia 2 were similar, but slightly reduced with puree. Per EMR, pt consumed 100% of lunch yesterday. Pt's cognitive impairments have become more significant since admission with reduced attention and current inability to actively participate in dysarthria treatment. SLP considers the potential impact of prolonged admission on pt's baseline dementia. However, the possibility of other medical etiology is also questioned. Dr. Waldron Labs, Ebony Hail, NP and Joellen Jersey, LPN were advised of SLP's concerns; Ebony Hail indicated that she will follow up. Pt's current diet will be continued; full supervision is still neccessary. SLP will continue to follow.    HPI HPI: 70 y.o. female with medical history significant for advanced dementia, HTN, meningioma s/p resection, seizure, unspecified left MCA stroke s/p left-sided endarterectomy in 2020, HLD, currently enrolled in home hospice presented with new onset of left facial droop, left-sided weakness and slurred speech.  CT of the head showed acute stroke on right insula and operculum.  PTA pt fed herself a regular diet. MBSS 10/09/20 indicated intermittent aspiratin with thin liquids that was inconsistently sensed with diet recommendations of nectar thick and regular textures.      SLP Plan  Continue with current plan of care       Recommendations  Diet recommendations: Dysphagia 2 (fine chop);Thin liquid Liquids provided via: Cup;Straw Medication Administration: Whole meds with puree Supervision: Staff to assist with self feeding;Full supervision/cueing for compensatory strategies Compensations: Slow rate;Small sips/bites Postural Changes and/or Swallow Maneuvers: Seated upright 90 degrees                Oral Care Recommendations: Oral care BID;Oral care prior to ice chip/H20 Follow up Recommendations: Skilled Nursing facility SLP Visit Diagnosis: Dysphagia, oropharyngeal phase (R13.12);Dysarthria and anarthria (R47.1) Plan: Continue with current plan of care       Jenita Rayfield I. Hardin Negus, [place], [place] Office number [phone] Pager [place] 10/30/2020, 10:58 AM

## 2020-10-30 NOTE — Progress Notes (Signed)
Physical Therapy Treatment Patient Details Name: Kristy Cabrera MRN: [id number] DOB: 06/25/1951 Today's Date: 10/30/2020    History of Present Illness 70 y.o. female presented 3/13 with new onset of left facial droop, left-sided weakness and slurred speech. CT of the head showed acute stroke on right insula and operculum PMH:  advanced dementia, HTN, meningioma s/p resection, seizure, unspecified left MCA stroke s/p left-sided endarterectomy in 2020, HLD, currently enrolled in home hospice    PT Comments    Continuing work on functional mobility and activity tolerance;  Participating well, lots of encouragement to practice ambulating; Tried with RW today, and pt with short, wide steps; decr lateral weight shift into single limb stance both R and L LEs -- without full weight acceptance into single limb stance, she is unable to advance the swing LE; this results in short, festinating steps, inefficient gait and high fall risk;   Will continue rehab efforts to facilitate dc, and decr caregiver burden at home  Follow Up Recommendations  SNF;Home health PT (pt denied SNF placement by insurance. If D/C home pt will benefit from HHPT)     Equipment Recommendations  Wheelchair (measurements PT);Wheelchair cushion (measurements PT);Hospital bed  Consider mechanical lift and non-emergent ambulance ride home  Recommendations for Other Services       Precautions / Restrictions Precautions Precautions: Fall Precaution Comments: advanced dementia    Mobility  Bed Mobility Overal bed mobility: Needs Assistance Bed Mobility: Supine to Sit Rolling: Mod assist         General bed mobility comments: Multimodal cueing for techqniue to get up to sitting EOB; good initaiion with cues; hesitant to lean very far forward at EOB; Seems to be fearful of falling    Transfers Overall transfer level: Needs assistance Equipment used: 2 person hand held assist Transfers: Sit to/from Stand Sit to Stand:  Max assist         General transfer comment: Difficulty with aneroir lean and weight shift to initiate sit to stand; suspect due to fear of falling  Ambulation/Gait Ambulation/Gait assistance: Mod assist Gait Distance (Feet): 15 Feet Assistive device: 2 person hand held assist;Rolling walker (2 wheeled) Gait Pattern/deviations: Shuffle;Decreased step length - left;Decreased dorsiflexion - right;Decreased dorsiflexion - left;Decreased weight shift to left;Decreased weight shift to right;Wide base of support     General Gait Details: pt with very wide, shuffling steps, reduced foot clearance, resembling festination;  PT attempts to provide weight shift to aide in foot clearance and improve step length.   Stairs             Wheelchair Mobility    Modified Rankin (Stroke Patients Only) Modified Rankin (Stroke Patients Only) Pre-Morbid Rankin Score: Moderate disability Modified Rankin: Moderately severe disability     Balance Overall balance assessment: Needs assistance   Sitting balance-Leahy Scale: Fair       Standing balance-Leahy Scale: Poor                              Cognition Arousal/Alertness: Awake/alert Behavior During Therapy: Impulsive Overall Cognitive Status: No family/caregiver present to determine baseline cognitive functioning                                 General Comments: pt with increased time to follow simple one step commands. Pt is inconsistent at times when following commands. Pt with impaired safety awareness and reduced knowledge  of deficits      Exercises      General Comments        Pertinent Vitals/Pain Pain Assessment: Faces Faces Pain Scale: No hurt Pain Intervention(s): Monitored during session    Home Living                      Prior Function            PT Goals (current goals can now be found in the care plan section) Acute Rehab PT Goals Patient Stated Goal: Agreeable to  getting up PT Goal Formulation: Patient unable to participate in goal setting Time For Goal Achievement: 11/10/20 Potential to Achieve Goals: Fair Progress towards PT goals: Progressing toward goals (slowly)    Frequency    Min 3X/week      PT Plan Current plan remains appropriate    Co-evaluation              AM-PAC PT "6 Clicks" Mobility   Outcome Measure  Help needed turning from your back to your side while in a flat bed without using bedrails?: A Lot Help needed moving from lying on your back to sitting on the side of a flat bed without using bedrails?: A Lot Help needed moving to and from a bed to a chair (including a wheelchair)?: A Lot Help needed standing up from a chair using your arms (e.g., wheelchair or bedside chair)?: A Lot Help needed to walk in hospital room?: A Lot Help needed climbing 3-5 steps with a railing? : A Lot 6 Click Score: 12    End of Session Equipment Utilized During Treatment: Gait belt Activity Tolerance: Patient tolerated treatment well Patient left: in chair;with call bell/phone within reach;with chair alarm set Nurse Communication: Mobility status PT Visit Diagnosis: Muscle weakness (generalized) (M62.81);Difficulty in walking, not elsewhere classified (R26.2);Other abnormalities of gait and mobility (R26.89)     Time: 4270-6237 PT Time Calculation (min) (ACUTE ONLY): 16 min  Charges:  $Gait Training: 8-22 mins                     Roney Marion, [place]  Acute Rehabilitation Services Pager [phone] Office [place] 10/30/2020, 2:22 PM

## 2020-10-30 NOTE — Plan of Care (Signed)

## 2020-10-31 DIAGNOSIS — F329 Major depressive disorder, single episode, unspecified: Secondary | ICD-10-CM

## 2020-10-31 DIAGNOSIS — R8290 Unspecified abnormal findings in urine: Secondary | ICD-10-CM

## 2020-10-31 MED ORDER — SODIUM CHLORIDE 0.9 % IV SOLN
1.0000 g | INTRAVENOUS | Status: DC
  Administered 2020-10-31: 1 g via INTRAVENOUS
  Filled 2020-10-31: qty 10
  Filled 2020-10-31: qty 1

## 2020-10-31 NOTE — Plan of Care (Signed)

## 2020-10-31 NOTE — Plan of Care (Signed)
  Problem: Education: Goal: Knowledge of General Education information will improve Description: Including pain rating scale, medication(s)/side effects and non-pharmacologic comfort measures Outcome: Progressing   Problem: Health Behavior/Discharge Planning: Goal: Ability to manage health-related needs will improve Outcome: Progressing   Problem: Clinical Measurements: Goal: Ability to maintain clinical measurements within normal limits will improve Outcome: Progressing Goal: Will remain free from infection Outcome: Progressing Goal: Diagnostic test results will improve Outcome: Progressing Goal: Respiratory complications will improve Outcome: Progressing Goal: Cardiovascular complication will be avoided Outcome: Progressing   Problem: Activity: Goal: Risk for activity intolerance will decrease Outcome: Progressing   Problem: Nutrition: Goal: Adequate nutrition will be maintained Outcome: Progressing   Problem: Coping: Goal: Level of anxiety will decrease Outcome: Progressing   Problem: Elimination: Goal: Will not experience complications related to bowel motility Outcome: Progressing Goal: Will not experience complications related to urinary retention Outcome: Progressing   Problem: Pain Managment: Goal: General experience of comfort will improve Outcome: Progressing   Problem: Safety: Goal: Ability to remain free from injury will improve Outcome: Progressing   Problem: Skin Integrity: Goal: Risk for impaired skin integrity will decrease Outcome: Progressing   Problem: Education: Goal: Knowledge of disease or condition will improve Outcome: Progressing Goal: Knowledge of secondary prevention will improve Outcome: Progressing Goal: Knowledge of patient specific risk factors addressed and post discharge goals established will improve Outcome: Progressing Goal: Individualized Educational Video(s) Outcome: Progressing   Problem: Coping: Goal: Will verbalize  positive feelings about self Outcome: Progressing Goal: Will identify appropriate support needs Outcome: Progressing   Problem: Health Behavior/Discharge Planning: Goal: Ability to manage health-related needs will improve Outcome: Progressing   Problem: Nutrition: Goal: Risk of aspiration will decrease Outcome: Progressing Goal: Dietary intake will improve Outcome: Progressing   Problem: Ischemic Stroke/TIA Tissue Perfusion: Goal: Complications of ischemic stroke/TIA will be minimized Outcome: Progressing

## 2020-10-31 NOTE — Plan of Care (Signed)
  Problem: Health Behavior/Discharge Planning: Goal: Ability to manage health-related needs will improve Outcome: Progressing   Problem: Nutrition: Goal: Adequate nutrition will be maintained Outcome: Progressing   Problem: Pain Managment: Goal: General experience of comfort will improve Outcome: Progressing   Problem: Safety: Goal: Ability to remain free from injury will improve Outcome: Progressing   Problem: Skin Integrity: Goal: Risk for impaired skin integrity will decrease Outcome: Progressing   

## 2020-10-31 NOTE — Progress Notes (Signed)
Occupational Therapy Treatment Patient Details Name: Kristy Cabrera MRN: [id number] DOB: July 28, 1950 Today's Date: 10/31/2020    History of present illness 70 y.o. female presented 3/13 with new onset of left facial droop, left-sided weakness and slurred speech. CT of the head showed acute stroke on right insula and operculum PMH:  advanced dementia, HTN, meningioma s/p resection, seizure, unspecified left MCA stroke s/p left-sided endarterectomy in 2020, HLD, currently enrolled in home hospice   OT comments  Pt's goals updated. Pt continues to progress, but with advanced dementia, pt can be difficult to see progress. Pt continues to require modA for mobility with RW and set-upA to max assist for ADL. Pt seated for grooming; unmotivated in standing. Pt takes larger steps with pressure to her leading her forward. Pt would benefit from continued OT skilled services. OT following acutely.    Follow Up Recommendations  SNF;Supervision/Assistance - 24 hour    Equipment Recommendations  3 in 1 bedside commode    Recommendations for Other Services      Precautions / Restrictions Precautions Precautions: Fall Precaution Comments: advanced dementia Restrictions Weight Bearing Restrictions: No       Mobility Bed Mobility Overal bed mobility: Needs Assistance Bed Mobility: Supine to Sit Rolling: Mod assist Sidelying to sit: Mod assist       General bed mobility comments: cues to turn to L side for rolling and move trunk with BLEs.    Transfers Overall transfer level: Needs assistance Equipment used: Rolling walker (2 wheeled) Transfers: Sit to/from Stand Sit to Stand: Mod assist Stand pivot transfers: Mod assist       General transfer comment: Difficulty steering with RW and walks too far away from it. Pt requiring constant cues.    Balance Overall balance assessment: Needs assistance   Sitting balance-Leahy Scale: Fair       Standing balance-Leahy Scale:  Poor Standing balance comment: holds RW too far out in front                           ADL either performed or assessed with clinical judgement   ADL Overall ADL's : Needs assistance/impaired     Grooming: Wash/dry hands;Wash/dry face;Set up;Sitting Grooming Details (indicate cue type and reason): Pt will not seem to complete any tasks in standing to progress patient. pt must be seated. pt requiring multimodal cues.                                     Vision   Vision Assessment?: No apparent visual deficits   Perception     Praxis      Cognition Arousal/Alertness: Awake/alert Behavior During Therapy: Impulsive Overall Cognitive Status: No family/caregiver present to determine baseline cognitive functioning                                 General Comments: Pt with inconsistencies regarding her command follow and mutlimodal cues required. Pt requires max tactile cues to complete tasks. Still nonverbal mostly. Occasionally this OTR hears pt stating swear words.        Exercises     Shoulder Instructions       General Comments      Pertinent Vitals/ Pain       Pain Assessment: Faces Faces Pain Scale: No hurt Pain Intervention(s): Monitored during session  Home Living                                          Prior Functioning/Environment              Frequency  Min 2X/week        Progress Toward Goals  OT Goals(current goals can now be found in the care plan section)  Progress towards OT goals: Progressing toward goals  Acute Rehab OT Goals Patient Stated Goal: Agreeable to getting up OT Goal Formulation: Patient unable to participate in goal setting Time For Goal Achievement: 11/14/20 Potential to Achieve Goals: Good ADL Goals Pt Will Perform Eating: with modified independence;sitting Pt Will Perform Grooming: with supervision;standing;with set-up Pt Will Transfer to Toilet: with  supervision;bedside commode;ambulating Additional ADL Goal #1: Pt. to follow 1  step directions  100 percent of time Additional ADL Goal #2: Pt will increase to minA for ADL functional mobility and transfers to increase independence.  Plan Discharge plan remains appropriate    Co-evaluation                 AM-PAC OT "6 Clicks" Daily Activity     Outcome Measure   Help from another person eating meals?: A Little Help from another person taking care of personal grooming?: A Lot Help from another person toileting, which includes using toliet, bedpan, or urinal?: A Lot Help from another person bathing (including washing, rinsing, drying)?: A Lot Help from another person to put on and taking off regular upper body clothing?: A Lot Help from another person to put on and taking off regular lower body clothing?: A Lot 6 Click Score: 13    End of Session Equipment Utilized During Treatment: Gait belt;Rolling walker  OT Visit Diagnosis: Unsteadiness on feet (R26.81);Muscle weakness (generalized) (M62.81);Cognitive communication deficit (R41.841) Symptoms and signs involving cognitive functions: Other cerebrovascular disease   Activity Tolerance Patient tolerated treatment well   Patient Left in chair;with call bell/phone within reach;with chair alarm set   Nurse Communication Mobility status        Time: 1211-1228 OT Time Calculation (min): 17 min  Charges: OT General Charges $OT Visit: 1 Visit OT Treatments $Therapeutic Activity: 8-22 mins  Jefferey Pica, OTR/L Acute Rehabilitation Services Pager: [phone] Office: [phone]    Kristy Cabrera  C 10/31/2020, 4:58 PM

## 2020-10-31 NOTE — Progress Notes (Addendum)
[facility] PROGRESS NOTE  Kristy Cabrera QQI:[id number] DOB: Sep 10, 1950 DOA: 10/01/2020 PCP: [name], NP  Status: Remains inpatient appropriate because:Altered mental status, Unsafe d/c plan and Inpatient level of care appropriate due to severity of illness   Dispo: The patient is from: Home              Anticipated d/c is to: SNF; still lacks appropriate funding.  Medicaid application pending in [facility] with [name] [phone]; we also are waiting to determine if LOG assistance would be available.  This is likely facility dependent.              Patient currently is medically stable to d/c.   Difficult to place patient Yes   Level of care: Med-Surg  Code Status: DNR Family Communication: 4/7 Husband [name] updated DVT prophylaxis: Lovenox Vaccination status: Unknown  Foley catheter: Purewick  HPI: 70 y.o.femalewith medical history significant ofadvanced dementia, HTN, meningioma s/p resection, seizure, unspecified left MCA stroke s/pleft-sided endarterectomy in 2020, HLD, currently enrolled in home hospice presented with new onset of left facial droop, left-sided weakness and slurred speech. On presentation code stroke was called. CT of the head showed acute stroke on right insula and operculum. Neurology was consulted.  PT recommended SNF   Subjective: Awakened.  Communicates primarily through nodding with occasional fluent speech.  Admitted to not liking texture but D2 diet.  Patient also admitted to feeling sad over the prolonged hospitalization.  Discussed initiation of Zoloft to help treat reactive depression as well as expected depression status post stroke  Objective: Vitals:   10/30/20 1943 10/31/20 0448  BP: 120/62 (!) 142/67  Pulse: 68 67  Resp: 18 18  Temp: 97.9 F (36.6 C) 97.6 F (36.4 C)  SpO2: 95% 93%   No intake or output data in the 24 hours ending 10/31/20 0742 Filed Weights   10/01/20 1338  Weight: 64 kg     Exam: General: Awakens, alert when awakened.  Bland affect but will smile at times.  No acute distress Respiratory: Anterior lung sounds are clear.  Stable on room air. Cardiovascular: Normal heart sounds.  Regular pulse.  No peripheral edema. Abdomen:  LBM 4/4, soft nontender nondistended.  Poor oral intake secondary to ongoing depression as well as dislike of textures of dysphagia 2 diet Neurologic: CN 2-12 grossly intact except for L facial droop-continues with aphasia.  Left upper extremity strength about 1-2/5 with right upper extremity strength about 3-4/5.  Sensation intact. Psychiatric: Awakens easily.  Oriented times name and is aware she is not in hospital.  Due to ongoing aphasia and inconsistent confluent speech orientation difficult to accurately assess.  Assessment/Plan: Acute problems: Acute right-sided stroke, right MCA territory -Continues to have slurred speech and left-sided facial droop.CT of the head positve for CVA andMRI of brain showed multiple small acute right MCA territory infarcts. CTA w/ new proximal right M2 occlusion with distal reconstitution, severe intracranial atherosclerosis, unchanged 50% mid right common carotid artery stenosis.Left carotid endarterectomy patent without recurrent stenosis -Had stroke in April 2020 prior to carotid endarterectomy that resulted in severe aphasia which apparently had improved prior to current stroke. -Aspirin, statin, Plavix per neurology -Echo shows EF of 55 to 60% with grade 1 diastolic dysfunction. -A1c 5.7 -LDL 153 and cholesterol 220 -PT/OT- SNF recommended -SLP documenting decline inpatient status today she asked me if she could sleep.  Suspect underlying depression from prolonged hospitalization.  We will begin low-dose Zoloft.  We will also check urinalysis and  culture to rule out occult UTI.  GNR UTI -Patient with large amount of leukocytes, positive nitrite, many bacteria and greater than 50 WBCs -Urine  culture w/ 100,000 colonies GNRs-final organism ID and sensitivities pending -Continue Rocephin -Patient has pure wick in place   Hypertension -SBP improved after resumption of home medications 4/8 -Continue Cozaar and Norvasc    Advanced dementia -Continue Aricept and Namenda -Fall and delirium precautions.   Moderate protein calorie malnutrition  Nutrition Problem: Moderate Malnutrition Etiology: chronic illness (advanced dementia; s/p R MCA in 2020, declined since) Signs/Symptoms: moderate muscle depletion,mild muscle depletion,mild fat depletion Interventions: Ensure Enlive (each supplement provides 350kcal and 20 grams of protein),Magic cup,MVI Estimated body mass index is 24.2 kg/m as calculated from the following:   Height as of this encounter: 5\' 4"  (1.626 m).   Weight as of this encounter: 64 kg. -Continue supplements -Eats better if foods are placed in a bowl where she can use a spoon to scoop up more easily -Reevaluated by SLP on 4/7 w/ MBSS  recommended contine D2 diet and thin liquids    Other problems:  Seizure disorder -Continue p.o. Keppra  Hyperlipidemia -Continue statin   Data Reviewed: Basic Metabolic Panel: Recent Labs  Lab 10/28/20 0221  NA 141  K 3.8  CL 110  CO2 24  GLUCOSE 93  BUN 23  CREATININE 0.70  CALCIUM 9.4   Liver Function Tests: No results for input(s): AST, ALT, ALKPHOS, BILITOT, PROT, ALBUMIN in the last 168 hours. No results for input(s): LIPASE, AMYLASE in the last 168 hours. No results for input(s): AMMONIA in the last 168 hours. CBC: Recent Labs  Lab 10/28/20 0221  WBC 10.1  HGB 13.0  HCT 39.6  MCV 90.6  PLT 344   Cardiac Enzymes: No results for input(s): CKTOTAL, CKMB, CKMBINDEX, TROPONINI in the last 168 hours. BNP (last 3 results) No results for input(s): BNP in the last 8760 hours.  ProBNP (last 3 results) No results for input(s): PROBNP in the last 8760 hours.  CBG: No results for input(s): GLUCAP  in the last 168 hours.  No results found for this or any previous visit (from the past 240 hour(s)).   Studies: No results found.  Scheduled Meds: . amLODipine  5 mg Oral Daily  . aspirin EC  81 mg Oral Daily  . atorvastatin  80 mg Oral q1800  . clopidogrel  75 mg Oral Daily  . donepezil  5 mg Oral QHS  . enoxaparin (LOVENOX) injection  40 mg Subcutaneous Q24H  . feeding supplement  237 mL Oral TID BM  . levETIRAcetam  750 mg Oral BID  . losartan  25 mg Oral Daily  . melatonin  3 mg Oral QHS  . memantine  10 mg Oral Daily  . multivitamin with minerals  1 tablet Oral Daily  . sertraline  25 mg Oral Daily  . sodium chloride flush  3 mL Intravenous Once   Continuous Infusions:  Active Problems:   COPD (chronic obstructive pulmonary disease) (HCC)   Acute ischemic stroke (HCC)   Stroke (cerebrum) (HCC)   Malnutrition of moderate degree   Hospice care patient   Physical deconditioning   Dysphagia, pharyngoesophageal phase   Consultants:  Neurology  Palliative medicine  Procedures:  2D echocardiogram  Antibiotics:   Anti-infectives (From admission, onward)   None       Time spent: 20 minutes    [name] ANP  [facility] 7 am - 330 pm/M-F for direct patient  care and secure chat Please refer to Amion for contact info 30  days

## 2020-11-01 LAB — URINE CULTURE: Culture: >=100000 — AB

## 2020-11-01 MED ORDER — CEPHALEXIN 500 MG PO CAPS
500.0000 mg | ORAL_CAPSULE | Freq: Two times a day (BID) | ORAL | Status: AC
  Administered 2020-11-01 – 2020-11-04 (×8): 500 mg via ORAL
  Filled 2020-11-01 (×8): qty 1

## 2020-11-01 NOTE — Plan of Care (Signed)

## 2020-11-01 NOTE — Progress Notes (Signed)
Ok to change ceftriaxone to oral keflex to complete a total of 5d for pan sens e.coli per [name], NP.  Onnie Boer, PharmD, BCIDP, AAHIVP, CPP Infectious Disease Pharmacist 11/01/2020 10:13 AM

## 2020-11-01 NOTE — Progress Notes (Signed)
[facility] PROGRESS NOTE  Kristy Cabrera IWL:[id number] DOB: 02-22-1951 DOA: 10/01/2020 PCP: [name], NP  Status: Remains inpatient appropriate because:Altered mental status, Unsafe d/c plan and Inpatient level of care appropriate due to severity of illness   Dispo: The patient is from: Home              Anticipated d/c is to: SNF; still lacks appropriate funding.  Medicaid application pending in [facility] with Kristy Cabrera [phone]; we also are waiting to determine if LOG assistance would be available.  This is likely facility dependent.              Patient currently is medically stable to d/c.   Difficult to place patient Yes   Level of care: Med-Surg  Code Status: DNR Family Communication: 4/7 Husband Kristy Cabrera updated DVT prophylaxis: Lovenox Vaccination status: Unknown  Foley catheter: Purewick  HPI: 70 y.o.femalewith medical history significant ofadvanced dementia, HTN, meningioma s/p resection, seizure, unspecified left MCA stroke s/pleft-sided endarterectomy in 2020, HLD, currently enrolled in home hospice presented with new onset of left facial droop, left-sided weakness and slurred speech. On presentation code stroke was called. CT of the head showed acute stroke on right insula and operculum. Neurology was consulted.  PT recommended SNF   Subjective: Awakened.  Had bowel necessary with food and milk in mouth.  Assisted patient to expel debris from mouth.  Nurse made aware that patient fell asleep with food in mouth.  Currently patient denying nausea and states she feels better than she did yesterday.  Reminded patient she has a UTI.  Objective: Vitals:   10/31/20 2206 11/01/20 0630  BP: 130/75 (!) 159/2  Pulse: 65 61  Resp: 18 17  Temp: 98.4 F (36.9 C) 98.2 F (36.8 C)  SpO2: 95% 95%    Intake/Output Summary (Last 24 hours) at 11/01/2020 0750 Last data filed at 11/01/2020 0649 Gross per 24 hour  Intake 98.21 ml  Output 700 ml  Net  -601.79 ml   Filed Weights   10/01/20 1338  Weight: 64 kg    Exam: General: Awakened from sleep.  Pleasant.  No acute distress and appears to be comfortable Respiratory: Anterior lung sounds are clear to auscultation.  When patient awakened there was no choking or coughing in math.-Room air Cardiovascular: S1-S2.  Pulse regular.  No JVD.  No peripheral edema Abdomen:  LBM 4/11, last week soft and nontender.  Bowel sounds are present.  On a dysphagia diet Neurologic: CN 2-12 grossly intact except for L facial droop-continues with aphasia.  Left upper extremity strength about 1-2/5 with right upper extremity strength about 3-4/5.  Se nsation intact. Psychiatric: Sleeping but awakens easily.  Very interactive.  Oriented times name.  No  Assessment/Plan: Acute problems: Acute right-sided stroke, right MCA territory -Continues to have slurred speech and left-sided facial droop.CT of the head positve for CVA andMRI of brain showed multiple small acute right MCA territory infarcts. CTA w/ new proximal right M2 occlusion with distal reconstitution, severe intracranial atherosclerosis, unchanged 50% mid right common carotid artery stenosis.Left carotid endarterectomy patent without recurrent stenosis -Had stroke in April 2020 prior to carotid endarterectomy that resulted in severe aphasia which apparently had improved prior to current stroke. -Aspirin, statin, Plavix per neurology -Echo shows EF of 55 to 60% with grade 1 diastolic dysfunction. -A1c 5.7 -LDL 153 and cholesterol 220 -PT/OT- SNF recommended -Pt admitted to feelings of depression-Zoloft initiated  Pan sensitive E. Coli UTI -4/12 Discontinue Rocephin in favor of  Keflex -Patient has pure wick in place   Hypertension -SBP improved after resumption of home medications 4/8 -Continue Cozaar and Norvasc    Advanced dementia -Continue Aricept and Namenda -Fall and delirium precautions.   Moderate protein calorie malnutrition   Nutrition Problem: Moderate Malnutrition Etiology: chronic illness (advanced dementia; s/p R MCA in 2020, declined since) Signs/Symptoms: moderate muscle depletion,mild muscle depletion,mild fat depletion Interventions: Ensure Enlive (each supplement provides 350kcal and 20 grams of protein),Magic cup,MVI Estimated body mass index is 24.2 kg/m as calculated from the following:   Height as of this encounter: 5\' 4"  (1.626 m).   Weight as of this encounter: 64 kg. -Continue supplements -Eats better if foods are placed in a bowl where she can use a spoon to scoop up more easily -Reevaluated by SLP on 4/7 w/ MBSS  recommended contine D2 diet and thin liquids    Other problems:  Seizure disorder -Continue p.o. Keppra  Hyperlipidemia -Continue statin   Data Reviewed: Basic Metabolic Panel: Recent Labs  Lab 10/28/20 0221  NA 141  K 3.8  CL 110  CO2 24  GLUCOSE 93  BUN 23  CREATININE 0.70  CALCIUM 9.4   Liver Function Tests: No results for input(s): AST, ALT, ALKPHOS, BILITOT, PROT, ALBUMIN in the last 168 hours. No results for input(s): LIPASE, AMYLASE in the last 168 hours. No results for input(s): AMMONIA in the last 168 hours. CBC: Recent Labs  Lab 10/28/20 0221  WBC 10.1  HGB 13.0  HCT 39.6  MCV 90.6  PLT 344   Cardiac Enzymes: No results for input(s): CKTOTAL, CKMB, CKMBINDEX, TROPONINI in the last 168 hours. BNP (last 3 results) No results for input(s): BNP in the last 8760 hours.  ProBNP (last 3 results) No results for input(s): PROBNP in the last 8760 hours.  CBG: No results for input(s): GLUCAP in the last 168 hours.  Recent Results (from the past 240 hour(s))  Culture, Urine     Status: Abnormal   Collection Time: 10/30/20 10:53 AM   Specimen: Urine, Random  Result Value Ref Range Status   Specimen Description URINE, RANDOM  Final   Special Requests   Final    NONE Performed at [facility], [address] [place], [place] [address]     Culture >=100,000 COLONIES/mL ESCHERICHIA COLI (A)  Final   Report Status 11/01/2020 FINAL  Final   Organism ID, Bacteria ESCHERICHIA COLI (A)  Final      Susceptibility   Escherichia coli - MIC*    AMPICILLIN <=2 SENSITIVE Sensitive     CEFAZOLIN <=4 SENSITIVE Sensitive     CEFEPIME <=0.12 SENSITIVE Sensitive     CEFTRIAXONE <=0.25 SENSITIVE Sensitive     CIPROFLOXACIN <=0.25 SENSITIVE Sensitive     GENTAMICIN <=1 SENSITIVE Sensitive     IMIPENEM <=0.25 SENSITIVE Sensitive     NITROFURANTOIN <=16 SENSITIVE Sensitive     TRIMETH/SULFA <=20 SENSITIVE Sensitive     AMPICILLIN/SULBACTAM <=2 SENSITIVE Sensitive     PIP/TAZO <=4 SENSITIVE Sensitive     * >=100,000 COLONIES/mL ESCHERICHIA COLI     Studies: No results found.  Scheduled Meds: . amLODipine  5 mg Oral Daily  . aspirin EC  81 mg Oral Daily  . atorvastatin  80 mg Oral q1800  . clopidogrel  75 mg Oral Daily  . donepezil  5 mg Oral QHS  . enoxaparin (LOVENOX) injection  40 mg Subcutaneous Q24H  . feeding supplement  237 mL Oral TID BM  .  levETIRAcetam  750 mg Oral BID  . losartan  25 mg Oral Daily  . melatonin  3 mg Oral QHS  . memantine  10 mg Oral Daily  . multivitamin with minerals  1 tablet Oral Daily  . sertraline  25 mg Oral Daily  . sodium chloride flush  3 mL Intravenous Once   Continuous Infusions: . cefTRIAXone (ROCEPHIN)  IV 1 g (10/31/20 1154)    Active Problems:   COPD (chronic obstructive pulmonary disease) (HCC)   Acute ischemic stroke (HCC)   Stroke (cerebrum) (HCC)   Malnutrition of moderate degree   Hospice care patient   Physical deconditioning   Dysphagia, pharyngoesophageal phase   Reactive depression   Abnormal urinalysis   Consultants:  Neurology  Palliative medicine  Procedures:  2D echocardiogram  Antibiotics:   Anti-infectives (From admission, onward)   Start     Dose/Rate Route Frequency Ordered Stop   10/31/20 1030  cefTRIAXone (ROCEPHIN) 1 g in sodium chloride 0.9  % 100 mL IVPB        1 g 200 mL/hr over 30 Minutes Intravenous Every 24 hours 10/31/20 0935 11/07/20 1029       Time spent: 20 minutes    [name] ANP  [facility] 7 am - 330 pm/M-F for direct patient care and secure chat Please refer to Amion for contact info 31  days

## 2020-11-01 NOTE — Progress Notes (Signed)
Physical Therapy Treatment Patient Details Name: Kristy Cabrera MRN: [id number] DOB: February 25, 1951 Today's Date: 11/01/2020    History of Present Illness Pt is 70 y.o. female presented 3/13 with new onset of left facial droop, left-sided weakness and slurred speech. CT of the head showed acute stroke on right insula and operculum.  Pt remains hospitalized due to difficult to place - insurance has denied SNF , working on [employer] as spouse unable to care for pt at home.  PMH:  advanced dementia, HTN, meningioma s/p resection, seizure, unspecified left MCA stroke s/p left-sided endarterectomy in 2020, HLD, currently enrolled in home hospice    PT Comments    Pt with slow progress due to dementia.  She expressed fear of falling.  Pt often initiating transfers/mobility but difficulty following through.  Attempted multimodal cues, assist to weight shift, chair follow to limit fear of falling, and increased time for transfers. Continues to require mod A for transfers - did utilize assist of 2 for safety with chair follow.  Pt normally modified Independent at home and spouse unable to provide current mod A  Level that pt required. Recommend SNF but denied by insurance - making pt difficult to place pt.    Follow Up Recommendations  SNF (insurance denied SNF - if home 24 hr support and max HH services)     Equipment Recommendations  Wheelchair (measurements PT);Wheelchair cushion (measurements PT);Hospital bed    Recommendations for Other Services       Precautions / Restrictions Precautions Precautions: Fall Precaution Comments: advanced dementia    Mobility  Bed Mobility Overal bed mobility: Needs Assistance Bed Mobility: Supine to Sit     Supine to sit: Mod assist;HOB elevated     General bed mobility comments: Pt initiating transfer well when cued to get OOB , but unable to complete requiring mod A to lift trunk. Required mod A to scoot to EOB with bed pad - pt again attempting but  unable to complete despite assist to weight shift, pull forward, or just hand on back to push against .  Allowed increased time for all    Transfers Overall transfer level: Needs assistance Equipment used: Rolling walker (2 wheeled) Transfers: Sit to/from Stand Sit to Stand: +2 safety/equipment;Mod assist         General transfer comment: Required mod A to rise with cues/assist to lean forward and bed elevated.  Pt expressing fear of falling.  Ambulation/Gait Ambulation/Gait assistance: Mod assist;+2 safety/equipment Gait Distance (Feet): 12 Feet Assistive device: Rolling walker (2 wheeled) Gait Pattern/deviations: Shuffle;Decreased stride length;Trunk flexed     General Gait Details: Pt with with very short steps and reduced foot clearance due to fear of falling and difficulty weight shifting.  Pt with gait pattern resembling festination.  Attempted multimodal cues for increased step length including visual target; providing blocking at knee, assist to weight shift and assist to advance opposite foot; but neither method improved gait.  Slight improvement with therapist pulling RW forward to increase momentu.   Stairs             Wheelchair Mobility    Modified Rankin (Stroke Patients Only) Modified Rankin (Stroke Patients Only) Pre-Morbid Rankin Score: Moderate disability Modified Rankin: Moderately severe disability     Balance Overall balance assessment: Needs assistance Sitting-balance support: Feet supported;No upper extremity supported Sitting balance-Leahy Scale: Fair     Standing balance support: Bilateral upper extremity supported Standing balance-Leahy Scale: Poor Standing balance comment: Requiring RW and min A at times  Cognition Arousal/Alertness: Awake/alert Behavior During Therapy: WFL for tasks assessed/performed Overall Cognitive Status: No family/caregiver present to determine baseline cognitive  functioning                                 General Comments: Pt with limited communication and soft spoken.  Did occasionally answer therapist in 2-3 word phrases but often just grunted.  Did report fear of falling.  Inconsistent in following commands.  Difficulty with motor planning/follow through - would often initiate transfers with multimodal simple cues but unable to complete      Exercises      General Comments General comments (skin integrity, edema, etc.): VSS on RA      Pertinent Vitals/Pain Pain Assessment: No/denies pain    Home Living                      Prior Function            PT Goals (current goals can now be found in the care plan section) Acute Rehab PT Goals Patient Stated Goal: Agreeable to getting up PT Goal Formulation: Patient unable to participate in goal setting Time For Goal Achievement: 11/10/20 Potential to Achieve Goals: Fair Progress towards PT goals: Progressing toward goals (slowly)    Frequency    Min 3X/week      PT Plan Current plan remains appropriate    Co-evaluation              AM-PAC PT "6 Clicks" Mobility   Outcome Measure  Help needed turning from your back to your side while in a flat bed without using bedrails?: A Lot Help needed moving from lying on your back to sitting on the side of a flat bed without using bedrails?: A Lot Help needed moving to and from a bed to a chair (including a wheelchair)?: A Lot Help needed standing up from a chair using your arms (e.g., wheelchair or bedside chair)?: A Lot Help needed to walk in hospital room?: A Lot Help needed climbing 3-5 steps with a railing? : Total 6 Click Score: 11    End of Session Equipment Utilized During Treatment: Gait belt Activity Tolerance: Patient tolerated treatment well Patient left: in chair;with call bell/phone within reach;with chair alarm set Nurse Communication: Mobility status (wrote on white board assist of 2 for  safety) PT Visit Diagnosis: Muscle weakness (generalized) (M62.81);Difficulty in walking, not elsewhere classified (R26.2);Other abnormalities of gait and mobility (R26.89)     Time: 8453-6468 PT Time Calculation (min) (ACUTE ONLY): 17 min  Charges:  $Gait Training: 8-22 mins                     Abran Richard, PT Acute Rehab Services Pager [phone] [facility] [place] 11/01/2020, 5:15 PM

## 2020-11-01 NOTE — Progress Notes (Signed)
  Speech Language Pathology Treatment: Dysphagia;Cognitive-Linquistic (Dysarthria)  Patient Details Name: Kristy Cabrera MRN: [id number] DOB: Sep 23, 1950 Today's Date: 11/01/2020 Time: 9326-7124 SLP Time Calculation (min) (ACUTE ONLY): 20.98 min  Assessment / Plan / Recommendation Clinical Impression  Pt was seen for treatment with her nurse, Joellen Jersey present. UA was positive and antibiotics were started. Pt's RN reported that the pt's swallow has been improving since that the level of cueing necessary has reduced. Pt was alert and notably more communicative during this session with improved speech intelligibility. Pt was able to recall compensatory strategies for speech intelligibility and required occasional cues for vocal intensity. No s/sx of aspiration were noted with purees or with thin liquids via bottle. Oral holding continues to be noted, but pt was able to swallow some liquid boluses with only minimal holding and without need for verbal prompts; this is an improvement from the session on 4/11. Mild oral residue was improved with secondary swallows and cleared with a liquid wash. Pt's current diet of dysphagia 2 solids and thin liquids via cup will be continued. SLP will continue to follow pt.    HPI HPI: 70 y.o. female with medical history significant for advanced dementia, HTN, meningioma s/p resection, seizure, unspecified left MCA stroke s/p left-sided endarterectomy in 2020, HLD, currently enrolled in home hospice presented with new onset of left facial droop, left-sided weakness and slurred speech.  CT of the head showed acute stroke on right insula and operculum. PTA pt fed herself a regular diet. MBSS 10/09/20 indicated intermittent aspiratin with thin liquids that was inconsistently sensed with diet recommendations of nectar thick and regular textures.      SLP Plan  Continue with current plan of care       Recommendations  Diet recommendations: Dysphagia 2 (fine chop);Thin  liquid Liquids provided via: Cup;No straw Medication Administration: Whole meds with puree Supervision: Staff to assist with self feeding;Full supervision/cueing for compensatory strategies Compensations: Slow rate;Small sips/bites Postural Changes and/or Swallow Maneuvers: Seated upright 90 degrees                Oral Care Recommendations: Oral care BID;Oral care prior to ice chip/H20 Follow up Recommendations: Skilled Nursing facility SLP Visit Diagnosis: Dysphagia, oropharyngeal phase (R13.12);Dysarthria and anarthria (R47.1) Plan: Continue with current plan of care       Madhavi Hamblen I. Hardin Negus, [place], [place] Office number [phone] Pager [place] 11/01/2020, 12:05 PM

## 2020-11-02 NOTE — Plan of Care (Signed)
  Problem: Clinical Measurements: Goal: Ability to maintain clinical measurements within normal limits will improve Outcome: Progressing Goal: Will remain free from infection Outcome: Progressing   Problem: Activity: Goal: Risk for activity intolerance will decrease Outcome: Progressing   Problem: Nutrition: Goal: Adequate nutrition will be maintained Outcome: Progressing   Problem: Elimination: Goal: Will not experience complications related to bowel motility Outcome: Progressing

## 2020-11-02 NOTE — Progress Notes (Signed)
CSW received return message from [place] [facility] who states she has received majority of the documents for patient's Medicaid application and that it will likely be approved.  [facility] TOC staff will move forward with obtaining a bed offer from a facility willing to accept the patient as an LOG.  Madilyn Fireman, MSW, LCSW Transitions of Care  Clinical Social Worker II [phone]

## 2020-11-02 NOTE — Progress Notes (Signed)
PROGRESS NOTE    Kristy Cabrera  ZOX:[id number] DOB: [birth year] DOA: 10/01/2020 PCP: [name], NP    Brief Narrative:  70 year old with advanced dementia, hypertension, meningioma status postresection, seizure disorder, left MCA stroke, hyperlipidemia presented from home with new onset of left facial droop, left-sided weakness and slurred speech.  She was found to have acute stroke on the right insula operculum. Day 31 in the hospital waiting for long-term nursing home placement.   Assessment & Plan:   Active Problems:   COPD (chronic obstructive pulmonary disease) (HCC)   Acute ischemic stroke (HCC)   Stroke (cerebrum) (HCC)   Malnutrition of moderate degree   Hospice care patient   Physical deconditioning   Dysphagia, pharyngoesophageal phase   Reactive depression   Abnormal urinalysis  Right MCA territory stroke, currently on aspirin, statin and Plavix.  No new findings.  Waiting for nursing home placement.  E. coli UTI: Treated with Rocephin.  Now on Keflex.  Essential hypertension: Blood pressure is stable on Cozaar and Norvasc.  Advanced dementia: Continue Aricept and Namenda.  Fall and delirium precautions.  Seizure disorder: On Keppra.  No episode of new seizures.   DVT prophylaxis: enoxaparin (LOVENOX) injection 40 mg Start: 10/01/20 1415   Code Status: DNR Family Communication: None at the bedside Disposition Plan: Status is: Inpatient  Remains inpatient appropriate because:Unsafe d/c plan   Dispo: The patient is from: Home              Anticipated d/c is to: SNF              Patient currently is medically stable to d/c.   Difficult to place patient Yes         Consultants:   Neurology  Procedures:   None  Antimicrobials:  Antibiotics Given (last 72 hours)    Date/Time Action Medication Dose Rate   10/31/20 1154 New Bag/Given   cefTRIAXone (ROCEPHIN) 1 g in sodium chloride 0.9 % 100 mL IVPB 1 g 200 mL/hr   11/01/20 1111 Given    cephALEXin (KEFLEX) capsule 500 mg 500 mg    11/01/20 2034 Given   cephALEXin (KEFLEX) capsule 500 mg 500 mg    11/02/20 0916 Given   cephALEXin (KEFLEX) capsule 500 mg 500 mg          Subjective: Patient seen and examined.  Poor historian.  Looks comfortable and confused.  No overnight events.  Food at the side table, needs assist to feed.  Objective: Vitals:   10/31/20 2206 11/01/20 0630 11/01/20 1120 11/01/20 2111  BP: 130/75 (!) 159/2 (!) 157/64 (!) 141/68  Pulse: 65 61 60 62  Resp: 18 17 16 18   Temp: 98.4 F (36.9 C) 98.2 F (36.8 C)  98.2 F (36.8 C)  TempSrc: Oral Oral  Oral  SpO2: 95% 95% 95% 98%  Weight:      Height:       No intake or output data in the 24 hours ending 11/02/20 1025 Filed Weights   10/01/20 1338  Weight: 64 kg    Examination:  General exam: Pleasant.  Alert but not oriented. Respiratory system: Bilateral clear. Cardiovascular system: S1 & S2 heard, RRR.  Gastrointestinal system: Soft and nontender. Central nervous system: Alert but not oriented. Left-sided facial droop visible. Left upper and lower extremity weakness.  Upper extremity weaker than lower extremity.    Data Reviewed: I have personally reviewed following labs and imaging studies  CBC: Recent Labs  Lab 10/28/20 0221  WBC  10.1  HGB 13.0  HCT 39.6  MCV 90.6  PLT 154   Basic Metabolic Panel: Recent Labs  Lab 10/28/20 0221  NA 141  K 3.8  CL 110  CO2 24  GLUCOSE 93  BUN 23  CREATININE 0.70  CALCIUM 9.4   GFR: Estimated Creatinine Clearance: 56.5 mL/min (by C-G formula based on SCr of 0.7 mg/dL). Liver Function Tests: No results for input(s): AST, ALT, ALKPHOS, BILITOT, PROT, ALBUMIN in the last 168 hours. No results for input(s): LIPASE, AMYLASE in the last 168 hours. No results for input(s): AMMONIA in the last 168 hours. Coagulation Profile: No results for input(s): INR, PROTIME in the last 168 hours. Cardiac Enzymes: No results for input(s): CKTOTAL,  CKMB, CKMBINDEX, TROPONINI in the last 168 hours. BNP (last 3 results) No results for input(s): PROBNP in the last 8760 hours. HbA1C: No results for input(s): HGBA1C in the last 72 hours. CBG: No results for input(s): GLUCAP in the last 168 hours. Lipid Profile: No results for input(s): CHOL, HDL, LDLCALC, TRIG, CHOLHDL, LDLDIRECT in the last 72 hours. Thyroid Function Tests: No results for input(s): TSH, T4TOTAL, FREET4, T3FREE, THYROIDAB in the last 72 hours. Anemia Panel: No results for input(s): VITAMINB12, FOLATE, FERRITIN, TIBC, IRON, RETICCTPCT in the last 72 hours. Sepsis Labs: No results for input(s): PROCALCITON, LATICACIDVEN in the last 168 hours.  Recent Results (from the past 240 hour(s))  Culture, Urine     Status: Abnormal   Collection Time: 10/30/20 10:53 AM   Specimen: Urine, Random  Result Value Ref Range Status   Specimen Description URINE, RANDOM  Final   Special Requests   Final    NONE Performed at [facility], [address], [place], [place] [address]    Culture >=100,000 COLONIES/mL ESCHERICHIA COLI (A)  Final   Report Status 11/01/2020 FINAL  Final   Organism ID, Bacteria ESCHERICHIA COLI (A)  Final      Susceptibility   Escherichia coli - MIC*    AMPICILLIN <=2 SENSITIVE Sensitive     CEFAZOLIN <=4 SENSITIVE Sensitive     CEFEPIME <=0.12 SENSITIVE Sensitive     CEFTRIAXONE <=0.25 SENSITIVE Sensitive     CIPROFLOXACIN <=0.25 SENSITIVE Sensitive     GENTAMICIN <=1 SENSITIVE Sensitive     IMIPENEM <=0.25 SENSITIVE Sensitive     NITROFURANTOIN <=16 SENSITIVE Sensitive     TRIMETH/SULFA <=20 SENSITIVE Sensitive     AMPICILLIN/SULBACTAM <=2 SENSITIVE Sensitive     PIP/TAZO <=4 SENSITIVE Sensitive     * >=100,000 COLONIES/mL ESCHERICHIA COLI         Radiology Studies: No results found.      Scheduled Meds: . amLODipine  5 mg Oral Daily  . aspirin EC  81 mg Oral Daily  . atorvastatin  80 mg Oral q1800  . cephALEXin  500 mg Oral  Q12H  . clopidogrel  75 mg Oral Daily  . donepezil  5 mg Oral QHS  . enoxaparin (LOVENOX) injection  40 mg Subcutaneous Q24H  . feeding supplement  237 mL Oral TID BM  . levETIRAcetam  750 mg Oral BID  . losartan  25 mg Oral Daily  . melatonin  3 mg Oral QHS  . memantine  10 mg Oral Daily  . multivitamin with minerals  1 tablet Oral Daily  . sertraline  25 mg Oral Daily  . sodium chloride flush  3 mL Intravenous Once   Continuous Infusions:   LOS: 32 days    Time spent:  20 minutes    Barb Merino, MD [facility] Pager [phone]

## 2020-11-03 DIAGNOSIS — N390 Urinary tract infection, site not specified: Secondary | ICD-10-CM

## 2020-11-03 DIAGNOSIS — B9620 Unspecified Escherichia coli [E. coli] as the cause of diseases classified elsewhere: Secondary | ICD-10-CM

## 2020-11-03 NOTE — Progress Notes (Signed)
[facility] PROGRESS NOTE  Kristy Cabrera LZJ:[id number] DOB: [birth year] DOA: 10/01/2020 PCP: [name], NP  Status: Remains inpatient appropriate because:Altered mental status, Unsafe d/c plan and Inpatient level of care appropriate due to severity of illness   Dispo: The patient is from: Home              Anticipated d/c is to: SNF; still lacks appropriate funding.  Medicaid application pending in [facility] with Kristy Cabrera [phone]; This is likely facility dependent.  4/14 Medicaid worker states they are proceeding with Medicaid application.  Medicaid worker to speak with universal SNF on 4/18 and they will likely accept an LOG from [facility] and patient will thus be eligible for discharge              Patient currently is medically stable to d/c.   Difficult to place patient Yes   Level of care: Med-Surg  Code Status: DNR Family Communication: 4/7 Husband Kristy Cabrera updated DVT prophylaxis: Lovenox Vaccination status: Unknown  Foley catheter: Purewick  HPI: 70 y.o.femalewith medical history significant ofadvanced dementia, HTN, meningioma s/p resection, seizure, unspecified left MCA stroke s/pleft-sided endarterectomy in 2020, HLD, currently enrolled in home hospice presented with new onset of left facial droop, left-sided weakness and slurred speech. On presentation code stroke was called. CT of the head showed acute stroke on right insula and operculum. Neurology was consulted.  PT recommended SNF   Subjective: Sitting up in chair with eyes closed.  Immediately awakened upon my entry into the room.  When asked if she wanted me to discard her grits and use her grits bowl to place her eggs potatoes and ground up sausage and so she could eat more easily she nodded yes.  Objective: Vitals:   11/02/20 2124 11/03/20 0627  BP: (!) 147/63 (!) 141/62  Pulse: 65 60  Resp: 18 18  Temp: 98.4 F (36.9 C) 98.3 F (36.8 C)  SpO2: 98% 94%    Intake/Output Summary  (Last 24 hours) at 11/03/2020 0749 Last data filed at 11/03/2020 9924 Gross per 24 hour  Intake --  Output 700 ml  Net -700 ml   Filed Weights   10/01/20 1338  Weight: 64 kg    Exam: General: Awake, calm, no acute distress Respiratory: Anterior lung sounds clear.  Room air Cardiovascular: S1-S2.  No peripheral edema, no JVD.  Regular pulse Abdomen:  LBM 4/11, soft nontender nondistended.  Remains on dysphagia 2 diet.  Normoactive bowel sounds. Neurologic: CN 2-12 grossly intact except for L facial droop-continues with aphasia.  Left upper extremity strength about 1-2/5 with right upper extremity strength about 3-4/5.  Sensation intact. Psychiatric: Awake and oriented times name and place.  Pleasant affect.  Assessment/Plan: Acute problems: Acute right-sided stroke, right MCA territory -Continues to have slurred speech and left-sided facial droop.CT of the head positve for CVA andMRI of brain showed multiple small acute right MCA territory infarcts. CTA w/ new proximal right M2 occlusion with distal reconstitution, severe intracranial atherosclerosis, unchanged 50% mid right common carotid artery stenosis.Left carotid endarterectomy patent without recurrent stenosis -Had stroke in April 2020 prior to carotid endarterectomy that resulted in severe aphasia which apparently had improved prior to current stroke. -Aspirin, statin, Plavix per neurology -Echo shows EF of 55 to 60% with grade 1 diastolic dysfunction. -A1c 5.7 -LDL 153 and cholesterol 220 -PT/OT- SNF recommended -Pt admitted to feelings of depression-Zoloft initiated  Pan sensitive E. Coli UTI -4/12 Discontinued Rocephin in favor of Keflex -Patient has pure wick  in place   Hypertension -SBP improved after resumption of home medications 4/8 -Continue Cozaar and Norvasc    Advanced dementia -Continue Aricept and Namenda -Fall and delirium precautions.   Moderate protein calorie malnutrition  Nutrition Problem:  Moderate Malnutrition Etiology: chronic illness (advanced dementia; s/p R MCA in 2020, declined since) Signs/Symptoms: moderate muscle depletion,mild muscle depletion,mild fat depletion Interventions: Ensure Enlive (each supplement provides 350kcal and 20 grams of protein),Magic cup,MVI Estimated body mass index is 24.2 kg/m as calculated from the following:   Height as of this encounter: 5\' 4"  (1.626 m).   Weight as of this encounter: 64 kg. -Continue supplements -Eats better if foods are placed in a bowl where she can use a spoon to scoop up more easily -Reevaluated by SLP on 4/7 w/ MBSS  recommended contine D2 diet and thin liquids    Other problems:  Seizure disorder -Continue p.o. Keppra  Hyperlipidemia -Continue statin   Data Reviewed: Basic Metabolic Panel: Recent Labs  Lab 10/28/20 0221  NA 141  K 3.8  CL 110  CO2 24  GLUCOSE 93  BUN 23  CREATININE 0.70  CALCIUM 9.4   Liver Function Tests: No results for input(s): AST, ALT, ALKPHOS, BILITOT, PROT, ALBUMIN in the last 168 hours. No results for input(s): LIPASE, AMYLASE in the last 168 hours. No results for input(s): AMMONIA in the last 168 hours. CBC: Recent Labs  Lab 10/28/20 0221  WBC 10.1  HGB 13.0  HCT 39.6  MCV 90.6  PLT 344   Cardiac Enzymes: No results for input(s): CKTOTAL, CKMB, CKMBINDEX, TROPONINI in the last 168 hours. BNP (last 3 results) No results for input(s): BNP in the last 8760 hours.  ProBNP (last 3 results) No results for input(s): PROBNP in the last 8760 hours.  CBG: No results for input(s): GLUCAP in the last 168 hours.  Recent Results (from the past 240 hour(s))  Culture, Urine     Status: Abnormal   Collection Time: 10/30/20 10:53 AM   Specimen: Urine, Random  Result Value Ref Range Status   Specimen Description URINE, RANDOM  Final   Special Requests   Final    NONE Performed at [facility], [address] [place], [place] [address]    Culture >=100,000  COLONIES/mL ESCHERICHIA COLI (A)  Final   Report Status 11/01/2020 FINAL  Final   Organism ID, Bacteria ESCHERICHIA COLI (A)  Final      Susceptibility   Escherichia coli - MIC*    AMPICILLIN <=2 SENSITIVE Sensitive     CEFAZOLIN <=4 SENSITIVE Sensitive     CEFEPIME <=0.12 SENSITIVE Sensitive     CEFTRIAXONE <=0.25 SENSITIVE Sensitive     CIPROFLOXACIN <=0.25 SENSITIVE Sensitive     GENTAMICIN <=1 SENSITIVE Sensitive     IMIPENEM <=0.25 SENSITIVE Sensitive     NITROFURANTOIN <=16 SENSITIVE Sensitive     TRIMETH/SULFA <=20 SENSITIVE Sensitive     AMPICILLIN/SULBACTAM <=2 SENSITIVE Sensitive     PIP/TAZO <=4 SENSITIVE Sensitive     * >=100,000 COLONIES/mL ESCHERICHIA COLI     Studies: No results found.  Scheduled Meds: . amLODipine  5 mg Oral Daily  . aspirin EC  81 mg Oral Daily  . atorvastatin  80 mg Oral q1800  . cephALEXin  500 mg Oral Q12H  . clopidogrel  75 mg Oral Daily  . donepezil  5 mg Oral QHS  . enoxaparin (LOVENOX) injection  40 mg Subcutaneous Q24H  . feeding supplement  237 mL Oral TID  BM  . levETIRAcetam  750 mg Oral BID  . losartan  25 mg Oral Daily  . melatonin  3 mg Oral QHS  . memantine  10 mg Oral Daily  . multivitamin with minerals  1 tablet Oral Daily  . sertraline  25 mg Oral Daily  . sodium chloride flush  3 mL Intravenous Once   Continuous Infusions:   Active Problems:   COPD (chronic obstructive pulmonary disease) (HCC)   Acute ischemic stroke (HCC)   Stroke (cerebrum) (HCC)   Malnutrition of moderate degree   Hospice care patient   Physical deconditioning   Dysphagia, pharyngoesophageal phase   Reactive depression   Abnormal urinalysis   Consultants:  Neurology  Palliative medicine  Procedures:  2D echocardiogram  Antibiotics:   Anti-infectives (From admission, onward)   Start     Dose/Rate Route Frequency Ordered Stop   11/01/20 1100  cephALEXin (KEFLEX) capsule 500 mg        500 mg Oral Every 12 hours 11/01/20 1012  11/05/20 0959   10/31/20 1030  cefTRIAXone (ROCEPHIN) 1 g in sodium chloride 0.9 % 100 mL IVPB  Status:  Discontinued        1 g 200 mL/hr over 30 Minutes Intravenous Every 24 hours 10/31/20 0935 11/01/20 1012       Time spent: 20 minutes    [name] ANP  [facility] 7 am - 330 pm/M-F for direct patient care and secure chat Please refer to Amion for contact info 33  days

## 2020-11-03 NOTE — Progress Notes (Signed)
Physical Therapy Treatment Patient Details Name: Kristy Cabrera MRN: [id number] DOB: August 02, 1950 Today's Date: 11/03/2020    History of Present Illness Pt is 70 y.o. female presented 3/13 with new onset of left facial droop, left-sided weakness and slurred speech. CT of the head showed acute stroke on right insula and operculum.  Pt remains hospitalized due to difficult to place - insurance has denied SNF , working on [employer] as spouse unable to care for pt at home.  PMH:  advanced dementia, HTN, meningioma s/p resection, seizure, unspecified left MCA stroke s/p left-sided endarterectomy in 2020, HLD, currently enrolled in home hospice    PT Comments    Pt received in bed. She required mod assist bed mobility, mod assist sit to stand, and mod assist ambulation 5' with RW. Encouragement needed to progress beyond EOB due to what appeared to be pt's increased fear of falling. She presented with shuffle gait pattern requiring assist to maintain balance and manage RW. Pt following commands inconsistently. Pleasant and smiling. Pt in recliner with feet elevated at end of session.    Follow Up Recommendations  SNF (insurance denied SNF, if home 24-hour assist and max HH services)     Equipment Recommendations  Wheelchair (measurements PT);Wheelchair cushion (measurements PT);Hospital bed    Recommendations for Other Services       Precautions / Restrictions Precautions Precautions: Fall;Other (comment) Precaution Comments: advanced dementia    Mobility  Bed Mobility Overal bed mobility: Needs Assistance Bed Mobility: Supine to Sit     Supine to sit: Mod assist;HOB elevated     General bed mobility comments: +rail, cues for sequencing, assist with BLE and to elevate trunk, increased time    Transfers Overall transfer level: Needs assistance Equipment used: Rolling walker (2 wheeled) Transfers: Sit to/from Stand Sit to Stand: Mod assist         General transfer comment:  continuous cues for sequencing, increased time/multiple attempts, pt appears fearful of falling, assist to power up  Ambulation/Gait Ambulation/Gait assistance: Mod assist Gait Distance (Feet): 5 Feet Assistive device: Rolling walker (2 wheeled) Gait Pattern/deviations: Decreased stride length;Shuffle;Trunk flexed Gait velocity: decreased Gait velocity interpretation: <1.8 ft/sec, indicate of risk for recurrent falls General Gait Details: assist to progress RW and maintain balance, short shuffle steps noted, continuous cues for sequencing   Stairs             Wheelchair Mobility    Modified Rankin (Stroke Patients Only) Modified Rankin (Stroke Patients Only) Pre-Morbid Rankin Score: Moderate disability Modified Rankin: Moderately severe disability     Balance Overall balance assessment: Needs assistance Sitting-balance support: Feet supported;No upper extremity supported Sitting balance-Leahy Scale: Fair     Standing balance support: Bilateral upper extremity supported;During functional activity Standing balance-Leahy Scale: Poor Standing balance comment: reliant on external support                            Cognition Arousal/Alertness: Awake/alert Behavior During Therapy: WFL for tasks assessed/performed Overall Cognitive Status: No family/caregiver present to determine baseline cognitive functioning                                 General Comments: Advanced dementia at baseline. No words spoken during session but making sing-songy noises through closed mouth as if she were engaged in a conversation with therapist. Inconsistently following commands. Difficulty with motor planning.  Exercises      General Comments General comments (skin integrity, edema, etc.): VSS on RA      Pertinent Vitals/Pain Pain Assessment: No/denies pain    Home Living                      Prior Function            PT Goals (current goals  can now be found in the care plan section) Acute Rehab PT Goals Patient Stated Goal: Agreeable to getting up Progress towards PT goals: Progressing toward goals    Frequency    Min 3X/week      PT Plan Current plan remains appropriate    Co-evaluation              AM-PAC PT "6 Clicks" Mobility   Outcome Measure  Help needed turning from your back to your side while in a flat bed without using bedrails?: A Lot Help needed moving from lying on your back to sitting on the side of a flat bed without using bedrails?: A Lot Help needed moving to and from a bed to a chair (including a wheelchair)?: A Lot Help needed standing up from a chair using your arms (e.g., wheelchair or bedside chair)?: A Lot Help needed to walk in hospital room?: A Lot Help needed climbing 3-5 steps with a railing? : Total 6 Click Score: 11    End of Session Equipment Utilized During Treatment: Gait belt Activity Tolerance: Patient tolerated treatment well Patient left: in chair;with call bell/phone within reach;with chair alarm set Nurse Communication: Mobility status PT Visit Diagnosis: Muscle weakness (generalized) (M62.81);Difficulty in walking, not elsewhere classified (R26.2);Other abnormalities of gait and mobility (R26.89)     Time: 0762-2633 PT Time Calculation (min) (ACUTE ONLY): 23 min  Charges:  $Gait Training: 23-37 mins                     Lorrin Goodell, [place]  Office # [phone] Pager [phone]    Lorriane Shire 71/15/2022, 10:14 AM

## 2020-11-03 NOTE — Progress Notes (Signed)
Occupational Therapy Treatment Patient Details Name: Kristy Cabrera MRN: [id number] DOB: Oct 05, 1950 Today's Date: 11/03/2020    History of present illness Pt is 70 y.o. female presented 3/13 with new onset of left facial droop, left-sided weakness and slurred speech. CT of the head showed acute stroke on right insula and operculum.  Pt remains hospitalized due to difficult to place - insurance has denied SNF , working on [employer] as spouse unable to care for pt at home.  PMH:  advanced dementia, HTN, meningioma s/p resection, seizure, unspecified left MCA stroke s/p left-sided endarterectomy in 2020, HLD, currently enrolled in home hospice   OT comments  Pt making gradual progress towards OT goals this session. Pt received seated in recliner with stating "yes" when asking if pt needs to return to bed. Pt continues to present with impaired balance, impaired motor planning and baseline cognitive deficits. Pt currently requires MOD A +2 for safety for stand pivot transfer back to bed with RW. Pt with short shuffled gait needing tactile cues at hips to initiate functional steps through lateral weight shifting. Pt would continue to benefit from skilled occupational therapy while admitted and after d/c to address the below listed limitations in order to improve overall functional mobility and facilitate independence with BADL participation. DC plan remains appropriate, will follow acutely per POC.     Follow Up Recommendations  SNF;Supervision/Assistance - 24 hour    Equipment Recommendations  3 in 1 bedside commode    Recommendations for Other Services      Precautions / Restrictions Precautions Precautions: Fall;Other (comment) Precaution Comments: advanced dementia Restrictions Weight Bearing Restrictions: No       Mobility Bed Mobility Overal bed mobility: Needs Assistance Bed Mobility: Sit to Supine       Sit to supine: Mod assist;+2 for physical assistance;HOB elevated    General bed mobility comments: MOD A +2 to return pt to supine, pt initiating with lower trunk down to supine but needed assist for sequencing and to elevate BLEs back to bed    Transfers                      Balance Overall balance assessment: Needs assistance Sitting-balance support: Feet supported;Bilateral upper extremity supported Sitting balance-Leahy Scale: Poor Sitting balance - Comments: close min guard assist as pt sitting prematurely onto EOB   Standing balance support: Bilateral upper extremity supported;During functional activity Standing balance-Leahy Scale: Poor Standing balance comment: reliant on external support                           ADL either performed or assessed with clinical judgement   ADL Overall ADL's : Needs assistance/impaired                         Toilet Transfer: Moderate assistance;Stand-pivot;RW Toilet Transfer Details (indicate cue type and reason): simulated via functional mobility with pt completing stand pivot from recliner>EOB with Rw, pt with difficulty with motor planning, provided tactile cues as hips to facilitate weight shifting         Functional mobility during ADLs: Moderate assistance;Rolling walker;+2 for safety/equipment General ADL Comments: pt continues to present with impaired motor planning, baseline cognitive deficits, and impaired balance     Vision       Perception     Praxis      Cognition Arousal/Alertness: Awake/alert Behavior During Therapy: WFL for tasks assessed/performed Overall Cognitive Status:  History of cognitive impairments - at baseline                                 General Comments: baseline advanced dementia pt did state phrases such as "bless you" "I can help"  "yes" and "no" pt benefits from tactile cues and short simple commands for mobility tasks        Exercises     Shoulder Instructions       General Comments      Pertinent Vitals/  Pain       Pain Assessment: Faces Faces Pain Scale: No hurt  Home Living                                          Prior Functioning/Environment              Frequency  Min 2X/week        Progress Toward Goals  OT Goals(current goals can now be found in the care plan section)  Progress towards OT goals: Progressing toward goals  Acute Rehab OT Goals Patient Stated Goal: to get back in bed OT Goal Formulation: With patient Time For Goal Achievement: 11/14/20 Potential to Achieve Goals: [place] Discharge plan remains appropriate;Frequency remains appropriate    Co-evaluation                 AM-PAC OT "6 Clicks" Daily Activity     Outcome Measure   Help from another person eating meals?: A Little Help from another person taking care of personal grooming?: A Lot Help from another person toileting, which includes using toliet, bedpan, or urinal?: A Lot Help from another person bathing (including washing, rinsing, drying)?: A Lot Help from another person to put on and taking off regular upper body clothing?: A Lot Help from another person to put on and taking off regular lower body clothing?: Total 6 Click Score: 12    End of Session Equipment Utilized During Treatment: Gait belt;Rolling walker  OT Visit Diagnosis: Unsteadiness on feet (R26.81);Muscle weakness (generalized) (M62.81);Cognitive communication deficit (R41.841) Symptoms and signs involving cognitive functions: Other cerebrovascular disease   Activity Tolerance Patient tolerated treatment well   Patient Left in bed;with call bell/phone within reach;with bed alarm set   Nurse Communication Mobility status        Time: 9038-3338 OT Time Calculation (min): 10 min  Charges: OT General Charges $OT Visit: 1 Visit OT Treatments $Self Care/Home Management : 8-22 mins  [name]., COTA/L Acute Rehabilitation Services [phone]    [name] 11/03/2020, 4:02 PM

## 2020-11-03 NOTE — Progress Notes (Addendum)
10:15am: CSW received return call from [place] at [facility] [employer] wanting to discuss patient's Medicaid application with Medicaid worker Dorthea at [facility] prior to offering a bed. Archie Patten states the DON has tentatively approved the patient for acceptance.  9am: CSW spoke with Archie Patten at [facility] in [place] requested referral sent via hub for consideration for LTC.  CSW spoke with Olivia Mackie at [facility] in [place] who states the facility is not accepting LOG's at this time.  Madilyn Fireman, MSW, LCSW Transitions of Care  Clinical Social Worker II [phone]

## 2020-11-04 MED ORDER — AMLODIPINE BESYLATE 5 MG PO TABS
5.0000 mg | ORAL_TABLET | Freq: Once | ORAL | Status: AC
  Administered 2020-11-04: 5 mg via ORAL
  Filled 2020-11-04: qty 1

## 2020-11-04 MED ORDER — LEVETIRACETAM 100 MG/ML PO SOLN
750.0000 mg | Freq: Two times a day (BID) | ORAL | Status: DC
  Administered 2020-11-04 – 2020-11-10 (×12): 750 mg via ORAL
  Filled 2020-11-04 (×12): qty 10

## 2020-11-04 MED ORDER — AMLODIPINE BESYLATE 10 MG PO TABS
10.0000 mg | ORAL_TABLET | Freq: Every day | ORAL | Status: DC
  Administered 2020-11-05 – 2020-11-10 (×6): 10 mg via ORAL
  Filled 2020-11-04 (×6): qty 1

## 2020-11-04 NOTE — Plan of Care (Signed)

## 2020-11-04 NOTE — Progress Notes (Signed)
PROGRESS NOTE    Kristy Cabrera  ZDG:[id number] DOB: Jan 29, 1951 DOA: 10/01/2020 PCP: [name], NP    Brief Narrative:  70 year old with advanced dementia, hypertension, meningioma status postresection, seizure disorder, left MCA stroke, hyperlipidemia presented from home with new onset of left facial droop, left-sided weakness and slurred speech.  She was found to have acute stroke on the right insula operculum. Day 33 in the hospital waiting for long-term nursing home placement.  Unable to go home.  Likely discharge on 4/18.  Medicaid pending.   Assessment & Plan:   Active Problems:   COPD (chronic obstructive pulmonary disease) (HCC)   Acute ischemic stroke (HCC)   Stroke (cerebrum) (HCC)   Malnutrition of moderate degree   Hospice care patient   Physical deconditioning   Dysphagia, pharyngoesophageal phase   Reactive depression   Abnormal urinalysis   E. coli UTI  Right MCA territory stroke, currently on aspirin, statin and Plavix.  No new findings.  Waiting for nursing home placement.  E. coli UTI: Treated.  Completed therapy.  Essential hypertension: Blood pressures persistently elevated despite being on amlodipine 5 mg and losartan 25 mg. Increase amlodipine to 10 mg, additional dose 5 mg today.  Advanced dementia: Continue Aricept and Namenda.  Fall and delirium precautions.  Will be followed by hospice at nursing home.  Seizure disorder: On Keppra.  No episode of new seizures.   DVT prophylaxis: enoxaparin (LOVENOX) injection 40 mg Start: 10/01/20 1415   Code Status: DNR Family Communication: None at the bedside Disposition Plan: Status is: Inpatient  Remains inpatient appropriate because:Unsafe d/c plan   Dispo: The patient is from: Home              Anticipated d/c is to: SNF              Patient currently is medically stable to d/c.   Difficult to place patient Yes         Consultants:   Neurology  Procedures:   None  Antimicrobials:   Antibiotics Given (last 72 hours)    Date/Time Action Medication Dose   11/01/20 1111 Given   cephALEXin (KEFLEX) capsule 500 mg 500 mg   11/01/20 2034 Given   cephALEXin (KEFLEX) capsule 500 mg 500 mg   11/02/20 0916 Given   cephALEXin (KEFLEX) capsule 500 mg 500 mg   11/02/20 2245 Given   cephALEXin (KEFLEX) capsule 500 mg 500 mg   11/03/20 0819 Given   cephALEXin (KEFLEX) capsule 500 mg 500 mg   11/03/20 2100 Given   cephALEXin (KEFLEX) capsule 500 mg 500 mg   11/04/20 5956 Given   cephALEXin (KEFLEX) capsule 500 mg 500 mg         Subjective: Patient seen and examined.  She was cheerful.  Stated no complaints.  Poor historian.  Objective: Vitals:   11/03/20 2128 11/04/20 0451 11/04/20 0751 11/04/20 0927  BP: (!) 169/76 (!) 145/81  (!) 171/66  Pulse: 60 60  (!) 57  Resp: 18 16  18   Temp: 97.7 F (36.5 C) 98.2 F (36.8 C)    TempSrc: Oral Oral    SpO2: 95% 96% 93%   Weight:      Height:        Intake/Output Summary (Last 24 hours) at 11/04/2020 1016 Last data filed at 11/04/2020 0600 Gross per 24 hour  Intake --  Output 500 ml  Net -500 ml   Filed Weights   10/01/20 1338  Weight: 64 kg  Examination:  General exam: Pleasant.  Alert and trying to communicate, not oriented. Respiratory system: Bilateral clear. Cardiovascular system: S1 & S2 heard, RRR.  Gastrointestinal system: Soft and nontender. Central nervous system: Alert but not oriented. Left-sided facial droop visible. Left upper and lower extremity weakness.  Upper extremity weaker than lower extremity.    Data Reviewed: I have personally reviewed following labs and imaging studies  CBC: No results for input(s): WBC, NEUTROABS, HGB, HCT, MCV, PLT in the last 168 hours. Basic Metabolic Panel: No results for input(s): NA, K, CL, CO2, GLUCOSE, BUN, CREATININE, CALCIUM, MG, PHOS in the last 168 hours. GFR: Estimated Creatinine Clearance: 56.5 mL/min (by C-G formula based on SCr of 0.7  mg/dL). Liver Function Tests: No results for input(s): AST, ALT, ALKPHOS, BILITOT, PROT, ALBUMIN in the last 168 hours. No results for input(s): LIPASE, AMYLASE in the last 168 hours. No results for input(s): AMMONIA in the last 168 hours. Coagulation Profile: No results for input(s): INR, PROTIME in the last 168 hours. Cardiac Enzymes: No results for input(s): CKTOTAL, CKMB, CKMBINDEX, TROPONINI in the last 168 hours. BNP (last 3 results) No results for input(s): PROBNP in the last 8760 hours. HbA1C: No results for input(s): HGBA1C in the last 72 hours. CBG: No results for input(s): GLUCAP in the last 168 hours. Lipid Profile: No results for input(s): CHOL, HDL, LDLCALC, TRIG, CHOLHDL, LDLDIRECT in the last 72 hours. Thyroid Function Tests: No results for input(s): TSH, T4TOTAL, FREET4, T3FREE, THYROIDAB in the last 72 hours. Anemia Panel: No results for input(s): VITAMINB12, FOLATE, FERRITIN, TIBC, IRON, RETICCTPCT in the last 72 hours. Sepsis Labs: No results for input(s): PROCALCITON, LATICACIDVEN in the last 168 hours.  Recent Results (from the past 240 hour(s))  Culture, Urine     Status: Abnormal   Collection Time: 10/30/20 10:53 AM   Specimen: Urine, Random  Result Value Ref Range Status   Specimen Description URINE, RANDOM  Final   Special Requests   Final    NONE Performed at [facility], [address] [place], [place] [address]    Culture >=100,000 COLONIES/mL ESCHERICHIA COLI (A)  Final   Report Status 11/01/2020 FINAL  Final   Organism ID, Bacteria ESCHERICHIA COLI (A)  Final      Susceptibility   Escherichia coli - MIC*    AMPICILLIN <=2 SENSITIVE Sensitive     CEFAZOLIN <=4 SENSITIVE Sensitive     CEFEPIME <=0.12 SENSITIVE Sensitive     CEFTRIAXONE <=0.25 SENSITIVE Sensitive     CIPROFLOXACIN <=0.25 SENSITIVE Sensitive     GENTAMICIN <=1 SENSITIVE Sensitive     IMIPENEM <=0.25 SENSITIVE Sensitive     NITROFURANTOIN <=16 SENSITIVE Sensitive      TRIMETH/SULFA <=20 SENSITIVE Sensitive     AMPICILLIN/SULBACTAM <=2 SENSITIVE Sensitive     PIP/TAZO <=4 SENSITIVE Sensitive     * >=100,000 COLONIES/mL ESCHERICHIA COLI         Radiology Studies: No results found.      Scheduled Meds: . [START ON 11/05/2020] amLODipine  10 mg Oral Daily  . amLODipine  5 mg Oral Once  . aspirin EC  81 mg Oral Daily  . atorvastatin  80 mg Oral q1800  . cephALEXin  500 mg Oral Q12H  . clopidogrel  75 mg Oral Daily  . donepezil  5 mg Oral QHS  . enoxaparin (LOVENOX) injection  40 mg Subcutaneous Q24H  . feeding supplement  237 mL Oral TID BM  . levETIRAcetam  750 mg  Oral BID  . losartan  25 mg Oral Daily  . melatonin  3 mg Oral QHS  . memantine  10 mg Oral Daily  . multivitamin with minerals  1 tablet Oral Daily  . sertraline  25 mg Oral Daily  . sodium chloride flush  3 mL Intravenous Once   Continuous Infusions:   LOS: 34 days    Time spent: 15 minutes.    Barb Merino, MD [facility] Pager [phone]

## 2020-11-05 NOTE — Progress Notes (Signed)
PROGRESS NOTE    Kristy Cabrera  VHQ:[id number] DOB: [birth year] DOA: 10/01/2020 PCP: [name], NP    Brief Narrative:  70 year old with advanced dementia, hypertension, meningioma status postresection, seizure disorder, left MCA stroke, hyperlipidemia presented from home with new onset of left facial droop, left-sided weakness and slurred speech.  She was found to have acute stroke on the right insula operculum. Day 34 in the hospital waiting for long-term nursing home placement.  Unable to go home.  Likely discharge on 4/18.  Medicaid pending.  No change in condition.  Pleasantly confused laying in bed.   Assessment & Plan:   Active Problems:   COPD (chronic obstructive pulmonary disease) (HCC)   Acute ischemic stroke (HCC)   Stroke (cerebrum) (HCC)   Malnutrition of moderate degree   Hospice care patient   Physical deconditioning   Dysphagia, pharyngoesophageal phase   Reactive depression   Abnormal urinalysis   E. coli UTI  Right MCA territory stroke, currently on aspirin, statin and Plavix.  No new findings.  Waiting for nursing home placement.  E. coli UTI: Treated.  Completed therapy.  Essential hypertension: Blood pressures persistently elevated despite being on amlodipine 5 mg and losartan 25 mg. Increase amlodipine to 10 mg, additional dose 5 mg today.  Advanced dementia: Continue Aricept and Namenda.  Fall and delirium precautions.  Will be followed by hospice at nursing home.  Seizure disorder: On Keppra.  No episode of new seizures.   DVT prophylaxis: enoxaparin (LOVENOX) injection 40 mg Start: 10/01/20 1415   Code Status: DNR Family Communication: None. Disposition Plan: Status is: Inpatient  Remains inpatient appropriate because:Unsafe d/c plan   Dispo: The patient is from: Home              Anticipated d/c is to: SNF              Patient currently is medically stable to d/c.   Difficult to place patient Yes         Consultants:    Neurology  Procedures:   None  Antimicrobials:  Antibiotics Given (last 72 hours)    Date/Time Action Medication Dose   11/02/20 2245 Given   cephALEXin (KEFLEX) capsule 500 mg 500 mg   11/03/20 0819 Given   cephALEXin (KEFLEX) capsule 500 mg 500 mg   11/03/20 2100 Given   cephALEXin (KEFLEX) capsule 500 mg 500 mg   11/04/20 4132 Given   cephALEXin (KEFLEX) capsule 500 mg 500 mg   11/04/20 2110 Given   cephALEXin (KEFLEX) capsule 500 mg 500 mg         Subjective: Seen and examined.  No overnight events.  Objective: Vitals:   11/04/20 1430 11/04/20 2214 11/05/20 0439 11/05/20 0941  BP: (!) 131/56 139/67 (!) 147/67 123/78  Pulse: 67 60 (!) 55 (!) 59  Resp: 17 16 16    Temp: 98.2 F (36.8 C) 98.1 F (36.7 C) 98.2 F (36.8 C)   TempSrc:  Oral Oral   SpO2: 96% 94% 95% 96%  Weight:      Height:        Intake/Output Summary (Last 24 hours) at 11/05/2020 1023 Last data filed at 11/05/2020 0500 Gross per 24 hour  Intake --  Output 500 ml  Net -500 ml   Filed Weights   10/01/20 1338  Weight: 64 kg    Examination:  Lying in bed.  Pleasant and confused.  Smiling.    Data Reviewed: I have personally reviewed following labs and imaging studies  CBC: No results for input(s): WBC, NEUTROABS, HGB, HCT, MCV, PLT in the last 168 hours. Basic Metabolic Panel: No results for input(s): NA, K, CL, CO2, GLUCOSE, BUN, CREATININE, CALCIUM, MG, PHOS in the last 168 hours. GFR: Estimated Creatinine Clearance: 56.5 mL/min (by C-G formula based on SCr of 0.7 mg/dL). Liver Function Tests: No results for input(s): AST, ALT, ALKPHOS, BILITOT, PROT, ALBUMIN in the last 168 hours. No results for input(s): LIPASE, AMYLASE in the last 168 hours. No results for input(s): AMMONIA in the last 168 hours. Coagulation Profile: No results for input(s): INR, PROTIME in the last 168 hours. Cardiac Enzymes: No results for input(s): CKTOTAL, CKMB, CKMBINDEX, TROPONINI in the last 168  hours. BNP (last 3 results) No results for input(s): PROBNP in the last 8760 hours. HbA1C: No results for input(s): HGBA1C in the last 72 hours. CBG: No results for input(s): GLUCAP in the last 168 hours. Lipid Profile: No results for input(s): CHOL, HDL, LDLCALC, TRIG, CHOLHDL, LDLDIRECT in the last 72 hours. Thyroid Function Tests: No results for input(s): TSH, T4TOTAL, FREET4, T3FREE, THYROIDAB in the last 72 hours. Anemia Panel: No results for input(s): VITAMINB12, FOLATE, FERRITIN, TIBC, IRON, RETICCTPCT in the last 72 hours. Sepsis Labs: No results for input(s): PROCALCITON, LATICACIDVEN in the last 168 hours.  Recent Results (from the past 240 hour(s))  Culture, Urine     Status: Abnormal   Collection Time: 10/30/20 10:53 AM   Specimen: Urine, Random  Result Value Ref Range Status   Specimen Description URINE, RANDOM  Final   Special Requests   Final    NONE Performed at [facility], [address] [place], [place] [address]    Culture >=100,000 COLONIES/mL ESCHERICHIA COLI (A)  Final   Report Status 11/01/2020 FINAL  Final   Organism ID, Bacteria ESCHERICHIA COLI (A)  Final      Susceptibility   Escherichia coli - MIC*    AMPICILLIN <=2 SENSITIVE Sensitive     CEFAZOLIN <=4 SENSITIVE Sensitive     CEFEPIME <=0.12 SENSITIVE Sensitive     CEFTRIAXONE <=0.25 SENSITIVE Sensitive     CIPROFLOXACIN <=0.25 SENSITIVE Sensitive     GENTAMICIN <=1 SENSITIVE Sensitive     IMIPENEM <=0.25 SENSITIVE Sensitive     NITROFURANTOIN <=16 SENSITIVE Sensitive     TRIMETH/SULFA <=20 SENSITIVE Sensitive     AMPICILLIN/SULBACTAM <=2 SENSITIVE Sensitive     PIP/TAZO <=4 SENSITIVE Sensitive     * >=100,000 COLONIES/mL ESCHERICHIA COLI         Radiology Studies: No results found.      Scheduled Meds: . amLODipine  10 mg Oral Daily  . aspirin EC  81 mg Oral Daily  . atorvastatin  80 mg Oral q1800  . clopidogrel  75 mg Oral Daily  . donepezil  5 mg Oral QHS  .  enoxaparin (LOVENOX) injection  40 mg Subcutaneous Q24H  . feeding supplement  237 mL Oral TID BM  . levETIRAcetam  750 mg Oral BID  . losartan  25 mg Oral Daily  . melatonin  3 mg Oral QHS  . memantine  10 mg Oral Daily  . multivitamin with minerals  1 tablet Oral Daily  . sertraline  25 mg Oral Daily  . sodium chloride flush  3 mL Intravenous Once   Continuous Infusions:   LOS: 35 days    Time spent: 15 minutes.    Barb Merino, MD [facility] Pager [phone]

## 2020-11-06 NOTE — Progress Notes (Addendum)
11:40am: CSW received return call from [facility] who states the business office has been unsuccessful in reaching patient's Medicaid worker so at this time, no bed offer will be made.  10:20am: CSW attempted to reach [place] at [facility] in [place] to determine if the business Glass blower/designer at the facility had spoken with patient's Medicaid worker yet - no answer, a voicemail was left requesting a return call.  Madilyn Fireman, MSW, LCSW Transitions of Care  Clinical Social Worker II [phone]

## 2020-11-06 NOTE — Progress Notes (Signed)
  Speech Language Pathology Treatment: Dysphagia  Patient Details Name: Kristy Cabrera MRN: [id number] DOB: [birth year] Today's Date: 11/06/2020 Time: 4462-8638 SLP Time Calculation (min) (ACUTE ONLY): 18 min  Assessment / Plan / Recommendation Clinical Impression  Pt repositioned upright in bed with assist from RN in preparation for am meal. Pt alert and communicated readiness for POs via frequent gesturing to meal tray. Pt self-fed dysphagia 2 solids and thin liquids with noticeable reduction in oral holding as indicated in previous SLP sessions. With consistent verbal cues for liquid/puree wash and lingual manipulation, pt able to achieve complete oral clearance of solids x2 throughout meal intake. Without assist and cueing, oral residue is mild and oral holding persists, but to a lesser degree as noted previously. Trials of thin liquids via straw with cues for small sips resulted in no s/sx of aspiration. Recommend pt continue on dysphagia 2, thin liquid diet (no straw) with SLP to f/u for continued tolerance, use of compensatory strategies and upgraded trials as indicated given pt's mentation has improved. Above communicated to RN and NT, emphasizing that pt continues to require full supervision during all meals given mentation and clinical s/sx of dysphagia noted during this session.    HPI HPI: 70 y.o. female with medical history significant for advanced dementia, HTN, meningioma s/p resection, seizure, unspecified left MCA stroke s/p left-sided endarterectomy in 2020, HLD, currently enrolled in home hospice presented with new onset of left facial droop, left-sided weakness and slurred speech.  CT of the head showed acute stroke on right insula and operculum. PTA pt fed herself a regular diet. MBSS 10/09/20 indicated intermittent aspiratin with thin liquids that was inconsistently sensed with diet recommendations of nectar thick and regular textures.      SLP Plan  Continue with current plan of  care       Recommendations  Diet recommendations: Dysphagia 2 (fine chop);Thin liquid Liquids provided via: Cup;No straw Medication Administration: Whole meds with puree Supervision: Staff to assist with self feeding;Full supervision/cueing for compensatory strategies Compensations: Minimize environmental distractions;Slow rate;Small sips/bites;Follow solids with liquid Postural Changes and/or Swallow Maneuvers: Seated upright 90 degrees                Oral Care Recommendations: Oral care BID Follow up Recommendations: Skilled Nursing facility SLP Visit Diagnosis: Dysphagia, oropharyngeal phase (R13.12) Plan: Continue with current plan of care       [place], [place], [place] Office Number: [phone]  Acie Fredrickson 11/06/2020, 10:11 AM

## 2020-11-06 NOTE — Progress Notes (Signed)
PROGRESS NOTE    Kristy Cabrera  YQM:[id number] DOB: 10-Jul-1951 DOA: 10/01/2020 PCP: [name], NP    Brief Narrative:  70 year old with advanced dementia, hypertension, meningioma status postresection, seizure disorder, left MCA stroke, hyperlipidemia presented from home with new onset of left facial droop, left-sided weakness and slurred speech.  She was found to have acute stroke on the right insula operculum. Day 35 in the hospital waiting for long-term nursing home placement.  Unable to go home.  Likely discharge on 4/18.  Medicaid pending.  No change in condition.  More interactive these days.  Pleasantly confused laying in bed.   Assessment & Plan:   Active Problems:   COPD (chronic obstructive pulmonary disease) (HCC)   Acute ischemic stroke (HCC)   Stroke (cerebrum) (HCC)   Malnutrition of moderate degree   Hospice care patient   Physical deconditioning   Dysphagia, pharyngoesophageal phase   Reactive depression   Abnormal urinalysis   E. coli UTI  Right MCA territory stroke, currently on aspirin, statin and Plavix.  No new findings.  Waiting for nursing home placement.  E. coli UTI: Treated.  Completed therapy.  Essential hypertension: Blood pressures persistently elevated despite being on amlodipine 5 mg and losartan 25 mg.  Increased dose of amlodipine with better control of blood pressure today.  Advanced dementia: Continue Aricept and Namenda.  Fall and delirium precautions.  Will be followed by hospice at nursing home.  Seizure disorder: On Keppra.  No episode of new seizures.   DVT prophylaxis: enoxaparin (LOVENOX) injection 40 mg Start: 10/01/20 1415   Code Status: DNR Family Communication: None. Disposition Plan: Status is: Inpatient  Remains inpatient appropriate because:Unsafe d/c plan   Dispo: The patient is from: Home              Anticipated d/c is to: SNF              Patient currently is medically stable to d/c.   Difficult to place  patient Yes         Consultants:   Neurology  Procedures:   None  Antimicrobials:  Antibiotics Given (last 72 hours)    Date/Time Action Medication Dose   11/03/20 2100 Given   cephALEXin (KEFLEX) capsule 500 mg 500 mg   11/04/20 5284 Given   cephALEXin (KEFLEX) capsule 500 mg 500 mg   11/04/20 2110 Given   cephALEXin (KEFLEX) capsule 500 mg 500 mg         Subjective: Seen and examined.  No overnight events.  Remains more alert and pleasantly confused.  Objective: Vitals:   11/05/20 0941 11/05/20 1404 11/05/20 2128 11/06/20 0357  BP: 123/78 133/69 (!) 146/63 136/63  Pulse: (!) 59 74 66 (!) 58  Resp:  16 16 18   Temp:   97.8 F (36.6 C) 97.8 F (36.6 C)  TempSrc:      SpO2: 96%  96% 94%  Weight:      Height:       No intake or output data in the 24 hours ending 11/06/20 0928 Filed Weights   10/01/20 1338  Weight: 64 kg    Examination:  Lying in bed.  Pleasant and confused.  Smiling. Can move all extremities.  Left side is mildly weaker than right side.  Follows commands.  She is alert and awake but not oriented.    Data Reviewed: I have personally reviewed following labs and imaging studies  CBC: No results for input(s): WBC, NEUTROABS, HGB, HCT, MCV, PLT in  the last 168 hours. Basic Metabolic Panel: No results for input(s): NA, K, CL, CO2, GLUCOSE, BUN, CREATININE, CALCIUM, MG, PHOS in the last 168 hours. GFR: Estimated Creatinine Clearance: 56.5 mL/min (by C-G formula based on SCr of 0.7 mg/dL). Liver Function Tests: No results for input(s): AST, ALT, ALKPHOS, BILITOT, PROT, ALBUMIN in the last 168 hours. No results for input(s): LIPASE, AMYLASE in the last 168 hours. No results for input(s): AMMONIA in the last 168 hours. Coagulation Profile: No results for input(s): INR, PROTIME in the last 168 hours. Cardiac Enzymes: No results for input(s): CKTOTAL, CKMB, CKMBINDEX, TROPONINI in the last 168 hours. BNP (last 3 results) No results for  input(s): PROBNP in the last 8760 hours. HbA1C: No results for input(s): HGBA1C in the last 72 hours. CBG: No results for input(s): GLUCAP in the last 168 hours. Lipid Profile: No results for input(s): CHOL, HDL, LDLCALC, TRIG, CHOLHDL, LDLDIRECT in the last 72 hours. Thyroid Function Tests: No results for input(s): TSH, T4TOTAL, FREET4, T3FREE, THYROIDAB in the last 72 hours. Anemia Panel: No results for input(s): VITAMINB12, FOLATE, FERRITIN, TIBC, IRON, RETICCTPCT in the last 72 hours. Sepsis Labs: No results for input(s): PROCALCITON, LATICACIDVEN in the last 168 hours.  Recent Results (from the past 240 hour(s))  Culture, Urine     Status: Abnormal   Collection Time: 10/30/20 10:53 AM   Specimen: Urine, Random  Result Value Ref Range Status   Specimen Description URINE, RANDOM  Final   Special Requests   Final    NONE Performed at [facility], [address], [place], [place] [address]    Culture >=100,000 COLONIES/mL ESCHERICHIA COLI (A)  Final   Report Status 11/01/2020 FINAL  Final   Organism ID, Bacteria ESCHERICHIA COLI (A)  Final      Susceptibility   Escherichia coli - MIC*    AMPICILLIN <=2 SENSITIVE Sensitive     CEFAZOLIN <=4 SENSITIVE Sensitive     CEFEPIME <=0.12 SENSITIVE Sensitive     CEFTRIAXONE <=0.25 SENSITIVE Sensitive     CIPROFLOXACIN <=0.25 SENSITIVE Sensitive     GENTAMICIN <=1 SENSITIVE Sensitive     IMIPENEM <=0.25 SENSITIVE Sensitive     NITROFURANTOIN <=16 SENSITIVE Sensitive     TRIMETH/SULFA <=20 SENSITIVE Sensitive     AMPICILLIN/SULBACTAM <=2 SENSITIVE Sensitive     PIP/TAZO <=4 SENSITIVE Sensitive     * >=100,000 COLONIES/mL ESCHERICHIA COLI         Radiology Studies: No results found.      Scheduled Meds: . amLODipine  10 mg Oral Daily  . aspirin EC  81 mg Oral Daily  . atorvastatin  80 mg Oral q1800  . clopidogrel  75 mg Oral Daily  . donepezil  5 mg Oral QHS  . enoxaparin (LOVENOX) injection  40 mg Subcutaneous  Q24H  . feeding supplement  237 mL Oral TID BM  . levETIRAcetam  750 mg Oral BID  . losartan  25 mg Oral Daily  . melatonin  3 mg Oral QHS  . memantine  10 mg Oral Daily  . multivitamin with minerals  1 tablet Oral Daily  . sertraline  25 mg Oral Daily  . sodium chloride flush  3 mL Intravenous Once   Continuous Infusions:   LOS: 36 days    Time spent: 15 minutes.    Barb Merino, MD [facility] Pager [phone]

## 2020-11-06 NOTE — Progress Notes (Signed)
Physical Therapy Treatment Patient Details Name: Kristy Cabrera MRN: [id number] DOB: 1951/01/26 Today's Date: 11/06/2020    History of Present Illness Pt is 70 y.o. female presented 3/13 with new onset of left facial droop, left-sided weakness and slurred speech. CT of the head showed acute stroke on right insula and operculum.  Pt remains hospitalized due to difficult to place - insurance has denied SNF , working on [employer] as spouse unable to care for pt at home.  PMH:  advanced dementia, HTN, meningioma s/p resection, seizure, unspecified left MCA stroke s/p left-sided endarterectomy in 2020, HLD, currently enrolled in home hospice    PT Comments    Pt admitted with above diagnosis. Pt was able to ambulate with incr distance with chair follow as well as use of RW and mod asssist given by therapist.  Pt continues to need constant cues and asssist. Updated frequency to 2 x week as pt is SNF at D/C.  Pt may be close to baseline as well.  Will continue to progress pt as able.  Pt currently with functional limitations due to balance and endurance deficits. Pt will benefit from skilled PT to increase their independence and safety with mobility to allow discharge to the venue listed below.     Follow Up Recommendations  SNF     Equipment Recommendations  Wheelchair (measurements PT);Wheelchair cushion (measurements PT);Hospital bed    Recommendations for Other Services       Precautions / Restrictions Precautions Precautions: Fall;Other (comment) Precaution Comments: advanced dementia Restrictions Weight Bearing Restrictions: No    Mobility  Bed Mobility Overal bed mobility: Needs Assistance Bed Mobility: Sit to Supine     Supine to sit: Min guard;Min assist     General bed mobility comments: Comes to bed with little asssist if given time to complete.    Transfers Overall transfer level: Needs assistance Equipment used: Rolling walker (2 wheeled) Transfers: Sit to/from  Stand Sit to Stand: Mod assist         General transfer comment: continuous cues for sequencing, increased time/multiple attempts, pt appears fearful of falling, assist to power up  Ambulation/Gait Ambulation/Gait assistance: Mod assist Gait Distance (Feet): 70 Feet Assistive device: Rolling walker (2 wheeled) Gait Pattern/deviations: Decreased stride length;Shuffle;Trunk flexed Gait velocity: decreased Gait velocity interpretation: <1.31 ft/sec, indicative of household ambulator General Gait Details: assist to progress RW and maintain balance, short shuffle steps noted, continuous cues for sequencing.  Pt takes incr time to ambulate 70 feet due to need for constant cuing as well as assist for each step by assist with moving RW.  Took 10 min to walk 70 feet. Followed with chair so pt could walk until she was tired. Did try ambulation without RW and pt too scared with bil HHA to take any steps.   Stairs             Wheelchair Mobility    Modified Rankin (Stroke Patients Only) Modified Rankin (Stroke Patients Only) Pre-Morbid Rankin Score: Moderate disability Modified Rankin: Moderately severe disability     Balance Overall balance assessment: Needs assistance Sitting-balance support: Feet supported;Bilateral upper extremity supported Sitting balance-Leahy Scale: Poor Sitting balance - Comments: close min guard assist as pt sitting prematurely onto EOB Postural control: Right lateral lean;Posterior lean Standing balance support: Bilateral upper extremity supported;During functional activity Standing balance-Leahy Scale: Poor Standing balance comment: reliant on external support as well as RW support               High Level  Balance Comments: Difficulty with backward walking            Cognition Arousal/Alertness: Awake/alert Behavior During Therapy: WFL for tasks assessed/performed Overall Cognitive Status: History of cognitive impairments - at baseline                                  General Comments: baseline advanced dementia pt did state phrases such as "bless you" "I can help"  "yes" and "no" pt benefits from tactile cues and short simple commands for mobility tasks      Exercises General Exercises - Lower Extremity Ankle Circles/Pumps: AROM;Both;10 reps;Seated Long Arc Quad: AROM;Both;10 reps;Seated Hip Flexion/Marching: AROM;Both;10 reps;Seated Other Exercises Other Exercises: sit to stands x3, standing tolerance    General Comments        Pertinent Vitals/Pain Pain Assessment: No/denies pain Faces Pain Scale: No hurt    Home Living                      Prior Function            PT Goals (current goals can now be found in the care plan section) Progress towards PT goals: Progressing toward goals    Frequency    Min 2X/week      PT Plan Frequency needs to be updated    Co-evaluation              AM-PAC PT "6 Clicks" Mobility   Outcome Measure  Help needed turning from your back to your side while in a flat bed without using bedrails?: A Little Help needed moving from lying on your back to sitting on the side of a flat bed without using bedrails?: A Little Help needed moving to and from a bed to a chair (including a wheelchair)?: A Lot Help needed standing up from a chair using your arms (e.g., wheelchair or bedside chair)?: A Lot Help needed to walk in hospital room?: A Lot Help needed climbing 3-5 steps with a railing? : Total 6 Click Score: 13    End of Session Equipment Utilized During Treatment: Gait belt Activity Tolerance: Patient tolerated treatment well Patient left: in chair;with call bell/phone within reach;with chair alarm set Nurse Communication: Mobility status PT Visit Diagnosis: Muscle weakness (generalized) (M62.81);Difficulty in walking, not elsewhere classified (R26.2);Other abnormalities of gait and mobility (R26.89)     Time: 8891-6945 PT Time  Calculation (min) (ACUTE ONLY): 19 min  Charges:  $Gait Training: 8-22 mins                     Teshawn Moan M,PT Acute [place] [phone] (pager)   [name] 11/06/2020, 1:43 PM

## 2020-11-06 NOTE — Progress Notes (Signed)
Nutrition Follow-up  DOCUMENTATION CODES:   Non-severe (moderate) malnutrition in context of chronic illness  INTERVENTION:  -Continue Ensure Enlive po TID, each supplement provides 350 kcal and 20 grams of protein -Continue meal assistance -Continue double protein portions with meals -Continue MVI with minerals daily  NUTRITION DIAGNOSIS:  Moderate Malnutrition related to chronic illness (advanced dementia; s/p R MCA in 2020, declined since) as evidenced by moderate muscle depletion,mild muscle depletion,mild fat depletion. - ongoing  GOAL:  Patient will meet greater than or equal to 90% of their needs - progressing  MONITOR:  PO intake,Supplement acceptance  REASON FOR ASSESSMENT:  Consult Assessment of nutrition requirement/status  ASSESSMENT:  70 yo female with a PMH of advanced dementia, HTN, meningioma s/p resection, seizure, left MCA stroke s/p L-sided endarterectomy in 2020, and HLD who presents with acute R-sided stroke.  3/14 dysphagia 1 diet with thins ordered 3/15 diet advanced to regular with thin liquids 3/21 diet downgraded to regular with nectar thick liquids  4/06 diet downgraded to dysphagia 1 with nectar thick liquids  4/07 diet advanced to dysphagia 2 with thin liquids 4/08 pt developed uncontrolled HTN; home BP medications resumed w/ PRN hydralazine  Pt more interactive today and, per RN, has been so for several days. Otherwise pt without change in condition. There continues to be limited meal documentation, but pt doing fairly well with meals and supplements per RN. Per MD, pt likely to discharge today; medicaid pending.  No UOP documented x24 hours   Medications and medications reviewed.  Diet Order:   Diet Order            DIET DYS 2 Room service appropriate? No; Fluid consistency: Thin  Diet effective now           Diet - low sodium heart healthy                 EDUCATION NEEDS:   No education needs have been identified at this  time  Skin:  Skin Assessment: Reviewed RN Assessment  Last BM:  4/15  Height:   Ht Readings from Last 1 Encounters:  10/01/20 5\' 4"  (1.626 m)    Weight:   Wt Readings from Last 1 Encounters:  10/01/20 64 kg    Ideal Body Weight:  54.5 kg  BMI:  Body mass index is 24.2 kg/m.  Estimated Nutritional Needs:   Kcal:  1600-1800  Protein:  80-95 grams  Fluid:  >1.6 L    Larkin Ina, MS, RD, LDN RD pager number and weekend/on-call pager number located in [place].

## 2020-11-07 DIAGNOSIS — F329 Major depressive disorder, single episode, unspecified: Secondary | ICD-10-CM

## 2020-11-07 DIAGNOSIS — E440 Moderate protein-calorie malnutrition: Secondary | ICD-10-CM

## 2020-11-07 DIAGNOSIS — R1314 Dysphagia, pharyngoesophageal phase: Secondary | ICD-10-CM

## 2020-11-07 DIAGNOSIS — R5381 Other malaise: Secondary | ICD-10-CM

## 2020-11-07 NOTE — Progress Notes (Signed)
[facility] PROGRESS NOTE  Imagine Nest PYP:[id number] DOB: June 07, 1951 DOA: 10/01/2020 PCP: [name], NP  Status: Remains inpatient appropriate because:Altered mental status, Unsafe d/c plan and Inpatient level of care appropriate due to severity of illness   Dispo: The patient is from: Home              Anticipated d/c is to: SNF; still lacks appropriate funding.  Medicaid application pending in [facility] with [name] [phone]; This is likely facility dependent.  4/14 Medicaid worker states they are proceeding with Medicaid application.  Medicaid worker to speak with [facility] SNF on 4/18 and they will likely accept an LOG from [facility] and patient will thus be eligible for discharge              Patient currently is medically stable to d/c.   Difficult to place patient Yes   Level of care: Med-Surg  Code Status: DNR Family Communication: 4/7 Husband [name] updated DVT prophylaxis: Lovenox Vaccination status: Unknown  Foley catheter: Purewick  HPI: 70 y.o.femalewith medical history significant ofadvanced dementia, HTN, meningioma s/p resection, seizure, unspecified left MCA stroke s/pleft-sided endarterectomy in 2020, HLD, currently enrolled in home hospice presented with new onset of left facial droop, left-sided weakness and slurred speech. On presentation code stroke was called. CT of the head showed acute stroke on right insula and operculum. Neurology was consulted.  PT recommended SNF   Subjective: Awaken from sleep.  Patient in bed.  Nodded in the affirmative that she was eating well and able to feed herself.  No complaints reported.  Objective: Vitals:   11/06/20 2131 11/07/20 0403  BP: 132/63 (!) 148/70  Pulse: 60 (!) 56  Resp: 16 14  Temp: 98 F (36.7 C) 97.8 F (36.6 C)  SpO2: 96% 95%   No intake or output data in the 24 hours ending 11/07/20 0804 Filed Weights   10/01/20 1338  Weight: 64 kg    Exam: General: No acute  distress.  Appears to be comfortable Respiratory: Lungs are clear anteriorly.  Room air. Cardiovascular: S1-S2.  No peripheral edema.  Regular pulse.  Skin warm and dry Abdomen:  LBM 4/15, soft nontender nondistended.  Normoactive bowel sounds.  50 to 100% of meals Neurologic: CN 2-12 grossly intact except for L facial droop-continues with aphasia.  Left upper extremity strength about 1-2/5 with right upper extremity strength about 3-4/5.  Sensation intact. Psychiatric: Awakens easily oriented times name and place.  Also oriented to year.  Did not affect when awake.  Continues to have issues with aphasia   Assessment/Plan: Acute problems: Acute right-sided stroke, right MCA territory -Continues to have slurred speech and left-sided facial droop.CT of the head positve for CVA andMRI of brain showed multiple small acute right MCA territory infarcts. CTA w/ new proximal right M2 occlusion with distal reconstitution, severe intracranial atherosclerosis, unchanged 50% mid right common carotid artery stenosis.Left carotid endarterectomy patent without recurrent stenosis -Had stroke in April 2020 prior to carotid endarterectomy that resulted in severe aphasia which apparently had improved prior to current stroke. -Aspirin and Plavix per neurology for 3 weeks (which has been completed) followed by Plavix monotherapy -Echo shows EF of 55 to 60% with grade 1 diastolic dysfunction. -A1c 5.7 -LDL 153 and cholesterol 220-continue statin -PT/OT- SNF recommended -Pt admitted to feelings of depression-Zoloft initiated  Pan sensitive E. Coli UTI -4/12 Discontinued Rocephin in favor of Keflex -Patient has pure wick in place   Hypertension -SBP improved after resumption of home  medications 4/8 -Continue Cozaar and Norvasc    Advanced dementia -Continue Aricept and Namenda -Fall and delirium precautions.   Moderate protein calorie malnutrition  Nutrition Problem: Moderate Malnutrition Etiology:  chronic illness (advanced dementia; s/p R MCA in 2020, declined since) Signs/Symptoms: moderate muscle depletion,mild muscle depletion,mild fat depletion Interventions: Ensure Enlive (each supplement provides 350kcal and 20 grams of protein),Magic cup,MVI Estimated body mass index is 24.2 kg/m as calculated from the following:   Height as of this encounter: 5\' 4"  (1.626 m).   Weight as of this encounter: 64 kg. -Continue supplements -Eats better if foods are placed in a bowl where she can use a spoon to scoop up more easily -Reevaluated by SLP on 4/7 w/ MBSS  recommended contine D2 diet and thin liquids    Other problems:  Seizure disorder -Continue p.o. Keppra  Hyperlipidemia -Continue statin   Data Reviewed: Basic Metabolic Panel: No results for input(s): NA, K, CL, CO2, GLUCOSE, BUN, CREATININE, CALCIUM, MG, PHOS in the last 168 hours. Liver Function Tests: No results for input(s): AST, ALT, ALKPHOS, BILITOT, PROT, ALBUMIN in the last 168 hours. No results for input(s): LIPASE, AMYLASE in the last 168 hours. No results for input(s): AMMONIA in the last 168 hours. CBC: No results for input(s): WBC, NEUTROABS, HGB, HCT, MCV, PLT in the last 168 hours. Cardiac Enzymes: No results for input(s): CKTOTAL, CKMB, CKMBINDEX, TROPONINI in the last 168 hours. BNP (last 3 results) No results for input(s): BNP in the last 8760 hours.  ProBNP (last 3 results) No results for input(s): PROBNP in the last 8760 hours.  CBG: No results for input(s): GLUCAP in the last 168 hours.  Recent Results (from the past 240 hour(s))  Culture, Urine     Status: Abnormal   Collection Time: 10/30/20 10:53 AM   Specimen: Urine, Random  Result Value Ref Range Status   Specimen Description URINE, RANDOM  Final   Special Requests   Final    NONE Performed at [facility], [address] [place], [place] [address]    Culture >=100,000 COLONIES/mL ESCHERICHIA COLI (A)  Final   Report Status  11/01/2020 FINAL  Final   Organism ID, Bacteria ESCHERICHIA COLI (A)  Final      Susceptibility   Escherichia coli - MIC*    AMPICILLIN <=2 SENSITIVE Sensitive     CEFAZOLIN <=4 SENSITIVE Sensitive     CEFEPIME <=0.12 SENSITIVE Sensitive     CEFTRIAXONE <=0.25 SENSITIVE Sensitive     CIPROFLOXACIN <=0.25 SENSITIVE Sensitive     GENTAMICIN <=1 SENSITIVE Sensitive     IMIPENEM <=0.25 SENSITIVE Sensitive     NITROFURANTOIN <=16 SENSITIVE Sensitive     TRIMETH/SULFA <=20 SENSITIVE Sensitive     AMPICILLIN/SULBACTAM <=2 SENSITIVE Sensitive     PIP/TAZO <=4 SENSITIVE Sensitive     * >=100,000 COLONIES/mL ESCHERICHIA COLI     Studies: No results found.  Scheduled Meds: . amLODipine  10 mg Oral Daily  . atorvastatin  80 mg Oral q1800  . clopidogrel  75 mg Oral Daily  . donepezil  5 mg Oral QHS  . enoxaparin (LOVENOX) injection  40 mg Subcutaneous Q24H  . feeding supplement  237 mL Oral TID BM  . levETIRAcetam  750 mg Oral BID  . losartan  25 mg Oral Daily  . melatonin  3 mg Oral QHS  . memantine  10 mg Oral Daily  . multivitamin with minerals  1 tablet Oral Daily  . sertraline  25 mg  Oral Daily  . sodium chloride flush  3 mL Intravenous Once   Continuous Infusions:   Active Problems:   COPD (chronic obstructive pulmonary disease) (HCC)   Acute ischemic stroke (HCC)   Stroke (cerebrum) (HCC)   Malnutrition of moderate degree   Hospice care patient   Physical deconditioning   Dysphagia, pharyngoesophageal phase   Reactive depression   Abnormal urinalysis   E. coli UTI   Consultants:  Neurology  Palliative medicine  Procedures:  2D echocardiogram  Antibiotics:   Anti-infectives (From admission, onward)   Start     Dose/Rate Route Frequency Ordered Stop   11/01/20 1100  cephALEXin (KEFLEX) capsule 500 mg        500 mg Oral Every 12 hours 11/01/20 1012 11/04/20 2110   10/31/20 1030  cefTRIAXone (ROCEPHIN) 1 g in sodium chloride 0.9 % 100 mL IVPB  Status:   Discontinued        1 g 200 mL/hr over 30 Minutes Intravenous Every 24 hours 10/31/20 0935 11/01/20 1012       Time spent: 20 minutes    [name] ANP  [facility] 7 am - 330 pm/M-F for direct patient care and secure chat Please refer to Amion for contact info 37  days

## 2020-11-08 LAB — SARS CORONAVIRUS 2 (TAT 6-24 HRS): SARS Coronavirus 2: NEGATIVE

## 2020-11-08 MED ORDER — AMLODIPINE BESYLATE 10 MG PO TABS: 10.0000 mg | ORAL_TABLET | Freq: Every day | ORAL | Status: AC

## 2020-11-08 MED ORDER — SERTRALINE HCL 25 MG PO TABS: 25.0000 mg | ORAL_TABLET | Freq: Every day | ORAL | Status: AC

## 2020-11-08 MED ORDER — COVID-19 MRNA VACC (MODERNA) 100 MCG/0.5ML IM SUSP
0.5000 mL | Freq: Once | INTRAMUSCULAR | Status: AC
  Administered 2020-11-08: 0.5 mL via INTRAMUSCULAR
  Filled 2020-11-08: qty 0.5

## 2020-11-08 MED ORDER — ENSURE ENLIVE PO LIQD: 237.0000 mL | Freq: Three times a day (TID) | ORAL | 12 refills | Status: AC

## 2020-11-08 NOTE — Progress Notes (Addendum)
2:20pm: CSW spoke with Rasheema at [facility] - patient needs at least one COVID vaccine prior to arrival along with a COVID test Ebony Hail, NP aware of need and will order both.  1:50pm: CSW received return call from Luck at [facility] stating the facility can offer her a bed.  CSW spoke with patient's husband [name] to inform him of bed offer from [facility], he is agreeable to accept the bed offer. Patient's husband agreeable for a COVID vaccine if needed for admission into the facility.  11:30am: CSW spoke with Rasheema at [facility] to request a review of this patient - Rasheema agreeable to review referral, clinical information sent in hub.  Madilyn Fireman, MSW, LCSW Transitions of Care  Clinical Social Worker II [phone]

## 2020-11-08 NOTE — Progress Notes (Signed)
Occupational Therapy Treatment Patient Details Name: Kristy Cabrera MRN: [id number] DOB: 1951-04-10 Today's Date: 11/08/2020    History of present illness Pt is 70 y.o. female presented 3/13 with new onset of left facial droop, left-sided weakness and slurred speech. CT of the head showed acute stroke on right insula and operculum.  Pt remains hospitalized due to difficult to place - insurance has denied SNF , working on [employer] as spouse unable to care for pt at home.  PMH:  advanced dementia, HTN, meningioma s/p resection, seizure, unspecified left MCA stroke s/p left-sided endarterectomy in 2020, HLD, currently enrolled in home hospice   OT comments  Pt has improved performance with self feeding tasks with supervisionA/set-upA, movement and advanced dementia. Pt unable to properly care for self and spouse is unable as he is w/c bound. Pt following 75% of 1-2 step commands. Pt would benefit from continued OT skilled services. OT following acutely.   Follow Up Recommendations  SNF;Supervision/Assistance - 24 hour    Equipment Recommendations  3 in 1 bedside commode    Recommendations for Other Services      Precautions / Restrictions Precautions Precautions: Fall;Other (comment) Precaution Comments: advanced dementia Restrictions Weight Bearing Restrictions: No       Mobility Bed Mobility               General bed mobility comments: pt in recliner pre and post session    Transfers Overall transfer level: Needs assistance Equipment used: Rolling walker (2 wheeled) Transfers: Sit to/from Stand Sit to Stand: Mod assist Stand pivot transfers: Mod assist       General transfer comment: not completed toda- focus on self care    Balance Overall balance assessment: Needs assistance Sitting-balance support: Feet supported;Bilateral upper extremity supported Sitting balance-Leahy Scale: Poor Sitting balance - Comments: pt sitting supported and unsupported; but fatigues  easily unsupported.   Standing balance support: Bilateral upper extremity supported;During functional activity Standing balance-Leahy Scale: Poor Standing balance comment: reliant on external support as well as RW support               High Level Balance Comments: Difficulty with backward walking           ADL either performed or assessed with clinical judgement   ADL Overall ADL's : Needs assistance/impaired Eating/Feeding: Set up;Sitting;Supervision/ safety Eating/Feeding Details (indicate cue type and reason): Pt feeding self today after set-upA with drink containers. pt using fork and all utensils appropriately. Pt working on command follow for 1-2 step commands 75% of accuracy. Grooming: Dance movement psychotherapist;Wash/dry hands;Minimal assistance Grooming Details (indicate cue type and reason): Pt asked to wipe face and only wiped R side of face; pt asked to wash hands, pt wiping hands. with minA.                             Functional mobility during ADLs: Moderate assistance;Rolling walker;+2 for safety/equipment General ADL Comments: pt continues to present with impaired motor planning, baseline cognitive deficits, and impaired balance     Vision       Perception     Praxis      Cognition Arousal/Alertness: Awake/alert Behavior During Therapy: WFL for tasks assessed/performed Overall Cognitive Status: History of cognitive impairments - at baseline                                 General Comments: baseline dementia;  following most commands while self feeding stating "juice", "done" and "drink"        Exercises Exercises: General Lower Extremity General Exercises - Lower Extremity Ankle Circles/Pumps: AROM;Both;10 reps;Seated Long Arc Quad: AROM;Both;10 reps;Seated Hip Flexion/Marching: AROM;Both;10 reps;Seated Other Exercises Other Exercises: sit to stands x3, standing tolerance   Shoulder Instructions       General Comments VSS on RA     Pertinent Vitals/ Pain       Pain Assessment: No/denies pain  Home Living                                          Prior Functioning/Environment              Frequency  Min 2X/week        Progress Toward Goals  OT Goals(current goals can now be found in the care plan section)  Progress towards OT goals: Progressing toward goals  Acute Rehab OT Goals Patient Stated Goal: to eat OT Goal Formulation: With patient Time For Goal Achievement: 11/14/20 Potential to Achieve Goals: Fair ADL Goals Pt Will Perform Eating: with modified independence;sitting Pt Will Perform Grooming: with supervision;standing;with set-up Pt Will Transfer to Toilet: with supervision;bedside commode;ambulating Additional ADL Goal #1: Pt. to follow 1  step directions  100 percent of time Additional ADL Goal #2: Pt will increase to minA for ADL functional mobility and transfers to increase independence.  Plan Discharge plan remains appropriate;Frequency remains appropriate    Co-evaluation                 AM-PAC OT "6 Clicks" Daily Activity     Outcome Measure   Help from another person eating meals?: A Little Help from another person taking care of personal grooming?: A Lot Help from another person toileting, which includes using toliet, bedpan, or urinal?: A Lot Help from another person bathing (including washing, rinsing, drying)?: A Lot Help from another person to put on and taking off regular upper body clothing?: A Lot Help from another person to put on and taking off regular lower body clothing?: Total 6 Click Score: 12    End of Session Equipment Utilized During Treatment: Gait belt;Rolling walker  OT Visit Diagnosis: Unsteadiness on feet (R26.81);Muscle weakness (generalized) (M62.81);Cognitive communication deficit (R41.841) Symptoms and signs involving cognitive functions: Other cerebrovascular disease   Activity Tolerance Patient tolerated treatment  well   Patient Left in chair;with call bell/phone within reach;with chair alarm set   Nurse Communication Mobility status        Time: 1019-1050 OT Time Calculation (min): 31 min  Charges: OT General Charges $OT Visit: 1 Visit OT Treatments $Self Care/Home Management : 23-37 mins  Jefferey Pica, OTR/L Acute Rehabilitation Services Pager: [phone] Office: [phone]    Kristy Cabrera  C 11/08/2020, 4:48 PM

## 2020-11-08 NOTE — Progress Notes (Addendum)
[facility] PROGRESS NOTE  Kristy Cabrera IEP:[id number] DOB: May 11, 1951 DOA: 10/01/2020 PCP: [name], NP  Status: Remains inpatient appropriate because:Altered mental status, Unsafe d/c plan and Inpatient level of care appropriate due to severity of illness   Dispo: The patient is from: Home              Anticipated d/c is to: SNF; has LOG bed offer from [facility] SN[facility]-husband updated by facility and LCSW 4/20              Patient currently is medically stable to d/c.   Difficult to place patient Yes   Level of care: Med-Surg  Code Status: DNR Family Communication: 4/20 Husband Kristy Cabrera updated by LCSW-no clinical changes to be discussed DVT prophylaxis: Lovenox Vaccination status: Not vaccinated but husband agreeable to proceed with vaccination-will order 1st Moderna today 4/20  Foley catheter: Purewick  HPI: 70 y.o.femalewith medical history significant ofadvanced dementia, HTN, meningioma s/p resection, seizure, unspecified left MCA stroke s/pleft-sided endarterectomy in 2020, HLD, currently enrolled in home hospice presented with new onset of left facial droop, left-sided weakness and slurred speech. On presentation code stroke was called. CT of the head showed acute stroke on right insula and operculum. Neurology was consulted.  PT recommended SNF   Subjective: Patient sleeping soundly.  Awakened.  Had not eaten breakfast yet.  When questioned had no specific complaints to offer  Objective: Vitals:   11/07/20 2025 11/08/20 0425  BP: (!) 153/69 130/65  Pulse: 66 70  Resp: 20 20  Temp: 97.8 F (36.6 C) 99 F (37.2 C)  SpO2:  94%   No intake or output data in the 24 hours ending 11/08/20 0745 Filed Weights   10/01/20 1338  Weight: 64 kg    Exam: General: Sleeping but awakened easily.  In no acute distress Respiratory: Anterior lung sounds clear.  Stable on room air Cardiovascular: S1-S2.  No peripheral edema.  Regular pulse. Abdomen:  LBM 4/15,  soft and nontender.  Bowel sounds are present.  Variable oral intake Neurologic: CN 2-12 grossly intact except for L facial droop-continues with aphasia.  Left upper extremity strength about 1-2/5 with right upper extremity strength about 3-4/5.  Sensation intact. Psychiatric:    Assessment/Plan: Acute problems: Acute right-sided stroke, right MCA territory -Continues to have slurred speech and left-sided facial droop.CT of the head positve for CVA andMRI of brain showed multiple small acute right MCA territory infarcts. CTA w/ new proximal right M2 occlusion with distal reconstitution, severe intracranial atherosclerosis, unchanged 50% mid right common carotid artery stenosis.Left carotid endarterectomy patent without recurrent stenosis -Had stroke in April 2020 prior to carotid endarterectomy that resulted in severe aphasia which apparently had improved prior to current stroke. -Aspirin and Plavix per neurology for 3 weeks (which has been completed) followed by Plavix monotherapy -Echo shows EF of 55 to 60% with grade 1 diastolic dysfunction. -LDL 153 and cholesterol 220-continue statin -PT/OT- SNF recommended -Pt admitted to feelings of depression-Zoloft initiated  Pan sensitive E. Coli UTI -4/12 Discontinued Rocephin in favor of Keflex -Patient has pure wick in place   Hypertension -SBP improved after resumption of home medications 4/8 -Continue Cozaar and Norvasc    Advanced dementia -Continue Aricept and Namenda -Fall and delirium precautions.   Moderate protein calorie malnutrition  Nutrition Problem: Moderate Malnutrition Etiology: chronic illness (advanced dementia; s/p R MCA in 2020, declined since) Signs/Symptoms: moderate muscle depletion,mild muscle depletion,mild fat depletion Interventions: Ensure Enlive (each supplement provides 350kcal and 20 grams  of protein),Magic cup,MVI Estimated body mass index is 24.2 kg/m as calculated from the following:   Height as  of this encounter: 5\' 4"  (1.626 m).   Weight as of this encounter: 64 kg. -Continue supplements -Eats better if foods are placed in a bowl where she can use a spoon to scoop up more easily -Reevaluated by SLP on 4/7 w/ MBSS  recommended contine D2 diet and thin liquids    Other problems:  Seizure disorder -Continue p.o. Keppra  Hyperlipidemia -Continue statin   Data Reviewed: Basic Metabolic Panel: No results for input(s): NA, K, CL, CO2, GLUCOSE, BUN, CREATININE, CALCIUM, MG, PHOS in the last 168 hours. Liver Function Tests: No results for input(s): AST, ALT, ALKPHOS, BILITOT, PROT, ALBUMIN in the last 168 hours. No results for input(s): LIPASE, AMYLASE in the last 168 hours. No results for input(s): AMMONIA in the last 168 hours. CBC: No results for input(s): WBC, NEUTROABS, HGB, HCT, MCV, PLT in the last 168 hours. Cardiac Enzymes: No results for input(s): CKTOTAL, CKMB, CKMBINDEX, TROPONINI in the last 168 hours. BNP (last 3 results) No results for input(s): BNP in the last 8760 hours.  ProBNP (last 3 results) No results for input(s): PROBNP in the last 8760 hours.  CBG: No results for input(s): GLUCAP in the last 168 hours.  Recent Results (from the past 240 hour(s))  Culture, Urine     Status: Abnormal   Collection Time: 10/30/20 10:53 AM   Specimen: Urine, Random  Result Value Ref Range Status   Specimen Description URINE, RANDOM  Final   Special Requests   Final    NONE Performed at [facility], [address], [place], [place] [address]    Culture >=100,000 COLONIES/mL ESCHERICHIA COLI (A)  Final   Report Status 11/01/2020 FINAL  Final   Organism ID, Bacteria ESCHERICHIA COLI (A)  Final      Susceptibility   Escherichia coli - MIC*    AMPICILLIN <=2 SENSITIVE Sensitive     CEFAZOLIN <=4 SENSITIVE Sensitive     CEFEPIME <=0.12 SENSITIVE Sensitive     CEFTRIAXONE <=0.25 SENSITIVE Sensitive     CIPROFLOXACIN <=0.25 SENSITIVE Sensitive      GENTAMICIN <=1 SENSITIVE Sensitive     IMIPENEM <=0.25 SENSITIVE Sensitive     NITROFURANTOIN <=16 SENSITIVE Sensitive     TRIMETH/SULFA <=20 SENSITIVE Sensitive     AMPICILLIN/SULBACTAM <=2 SENSITIVE Sensitive     PIP/TAZO <=4 SENSITIVE Sensitive     * >=100,000 COLONIES/mL ESCHERICHIA COLI     Studies: No results found.  Scheduled Meds: . amLODipine  10 mg Oral Daily  . atorvastatin  80 mg Oral q1800  . clopidogrel  75 mg Oral Daily  . donepezil  5 mg Oral QHS  . enoxaparin (LOVENOX) injection  40 mg Subcutaneous Q24H  . feeding supplement  237 mL Oral TID BM  . levETIRAcetam  750 mg Oral BID  . losartan  25 mg Oral Daily  . melatonin  3 mg Oral QHS  . memantine  10 mg Oral Daily  . multivitamin with minerals  1 tablet Oral Daily  . sertraline  25 mg Oral Daily  . sodium chloride flush  3 mL Intravenous Once   Continuous Infusions:   Active Problems:   COPD (chronic obstructive pulmonary disease) (HCC)   Acute ischemic stroke (HCC)   Stroke (cerebrum) (HCC)   Malnutrition of moderate degree   Hospice care patient   Physical deconditioning   Dysphagia, pharyngoesophageal phase  Reactive depression   Abnormal urinalysis   E. coli UTI   Consultants:  Neurology  Palliative medicine  Procedures:  2D echocardiogram  Antibiotics:   Anti-infectives (From admission, onward)   Start     Dose/Rate Route Frequency Ordered Stop   11/01/20 1100  cephALEXin (KEFLEX) capsule 500 mg        500 mg Oral Every 12 hours 11/01/20 1012 11/04/20 2110   10/31/20 1030  cefTRIAXone (ROCEPHIN) 1 g in sodium chloride 0.9 % 100 mL IVPB  Status:  Discontinued        1 g 200 mL/hr over 30 Minutes Intravenous Every 24 hours 10/31/20 0935 11/01/20 1012       Time spent: 20 minutes    [name] ANP  [facility] 7 am - 330 pm/M-F for direct patient care and secure chat Please refer to Amion for contact info 38  days

## 2020-11-08 NOTE — Progress Notes (Addendum)
Physical Therapy Treatment Patient Details Name: Kristy Cabrera MRN: [id number] DOB: 04-28-1951 Today's Date: 11/08/2020    History of Present Illness Pt is 70 y.o. female presented 3/13 with new onset of left facial droop, left-sided weakness and slurred speech. CT of the head showed acute stroke on right insula and operculum.  Pt remains hospitalized due to difficult to place - insurance has denied SNF , working on [employer] as spouse unable to care for pt at home.  PMH:  advanced dementia, HTN, meningioma s/p resection, seizure, unspecified left MCA stroke s/p left-sided endarterectomy in 2020, HLD, currently enrolled in home hospice    PT Comments    Pt admitted with above diagnosis. Pt continues to make slow progress toward goals. If followed with chair she can incr distance however she requires mod assist of 1  Person for assist with RW with max cues. Pt met 0/4 goals and revised goals today.  Downgraded goals a bit as pt has made very slow progress and pt may be approaching baseline.  Pt currently with functional limitations due to balance and endurance deficits. Pt will benefit from skilled PT to increase their independence and safety with mobility to allow discharge to the venue listed below.    Follow Up Recommendations  SNF     Equipment Recommendations  Wheelchair (measurements PT);Wheelchair cushion (measurements PT);Hospital bed    Recommendations for Other Services       Precautions / Restrictions Precautions Precautions: Fall;Other (comment) Precaution Comments: advanced dementia Restrictions Weight Bearing Restrictions: No    Mobility  Bed Mobility               General bed mobility comments: Pt in chair    Transfers Overall transfer level: Needs assistance Equipment used: Rolling walker (2 wheeled) Transfers: Sit to/from Stand Sit to Stand: Mod assist Stand pivot transfers: Mod assist       General transfer comment: continuous cues for sequencing,  increased time/multiple attempts, pt appears fearful of falling, assist to power up  Ambulation/Gait Ambulation/Gait assistance: Mod assist Gait Distance (Feet): 10 Feet Assistive device: Rolling walker (2 wheeled) Gait Pattern/deviations: Decreased stride length;Shuffle;Trunk flexed Gait velocity: decreased Gait velocity interpretation: <1.31 ft/sec, indicative of household ambulator General Gait Details: assist to progress RW and maintain balance, short shuffle steps noted, continuous cues for sequencing.  Pt limited with distance as did not have tech today to push chair and she seemed more hesitant to move forward.  continued to need assist with moving Rw and staying close to it.   Stairs             Wheelchair Mobility    Modified Rankin (Stroke Patients Only) Modified Rankin (Stroke Patients Only) Pre-Morbid Rankin Score: Moderate disability Modified Rankin: Moderately severe disability     Balance           Standing balance support: Bilateral upper extremity supported;During functional activity Standing balance-Leahy Scale: Poor Standing balance comment: reliant on external support as well as RW support               High Level Balance Comments: Difficulty with backward walking            Cognition Arousal/Alertness: Awake/alert Behavior During Therapy: WFL for tasks assessed/performed Overall Cognitive Status: History of cognitive impairments - at baseline                                 General Comments: baseline  advanced dementia pt did state phrases such as "bless you" "I can help"  "yes" and "no" pt benefits from tactile cues and short simple commands for mobility tasks      Exercises General Exercises - Lower Extremity Ankle Circles/Pumps: AROM;Both;10 reps;Seated Long Arc Quad: AROM;Both;10 reps;Seated Hip Flexion/Marching: AROM;Both;10 reps;Seated Other Exercises Other Exercises: sit to stands x3, standing tolerance     General Comments General comments (skin integrity, edema, etc.): VSS on RA      Pertinent Vitals/Pain Pain Assessment: No/denies pain    Home Living                      Prior Function            PT Goals (current goals can now be found in the care plan section) Acute Rehab PT Goals PT Goal Formulation: Patient unable to participate in goal setting Time For Goal Achievement: 11/22/20 Potential to Achieve Goals: Fair Progress towards PT goals: Progressing toward goals    Frequency    Min 2X/week      PT Plan Current plan remains appropriate    Co-evaluation              AM-PAC PT "6 Clicks" Mobility   Outcome Measure  Help needed turning from your back to your side while in a flat bed without using bedrails?: A Little Help needed moving from lying on your back to sitting on the side of a flat bed without using bedrails?: A Little Help needed moving to and from a bed to a chair (including a wheelchair)?: A Lot Help needed standing up from a chair using your arms (e.g., wheelchair or bedside chair)?: A Lot Help needed to walk in hospital room?: A Lot Help needed climbing 3-5 steps with a railing? : Total 6 Click Score: 13    End of Session Equipment Utilized During Treatment: Gait belt Activity Tolerance: Patient tolerated treatment well Patient left: in chair;with call bell/phone within reach;with chair alarm set Nurse Communication: Mobility status PT Visit Diagnosis: Muscle weakness (generalized) (M62.81);Difficulty in walking, not elsewhere classified (R26.2);Other abnormalities of gait and mobility (R26.89)     Time: 1106-1116 PT Time Calculation (min) (ACUTE ONLY): 10 min  Charges:  $Gait Training: 8-22 mins                      M,PT Acute Rehab Services [phone] (pager)    F McCrea 11/08/2020, 1:24 PM   

## 2020-11-09 NOTE — Plan of Care (Signed)

## 2020-11-09 NOTE — Progress Notes (Signed)
[facility] PROGRESS NOTE  Kristy Cabrera ZOX:[id number] DOB: [birth year] DOA: 10/01/2020 PCP: [name], NP  Status: Remains inpatient appropriate because:Altered mental status, Unsafe d/c plan and Inpatient level of care appropriate due to severity of illness   Dispo: The patient is from: Home              Anticipated d/c is to: SNF; LOG bed offer from [facility] SN[facility]-husband updated by facility and LCSW 4/20-subsequently on 4/21 bed offer rescinded with no rationale given              Patient currently is medically stable to d/c.   Difficult to place patient Yes   Level of care: Med-Surg  Code Status: DNR Family Communication: 4/20 Husband Kristy Cabrera updated by LCSW DVT prophylaxis: Lovenox Vaccination status: Not vaccinated but husband agreeable to proceed with vaccination-will order 1st Moderna today 4/20  Foley catheter: Purewick  HPI: 70 y.o.femalewith medical history significant ofadvanced dementia, HTN, meningioma s/p resection, seizure, unspecified left MCA stroke s/pleft-sided endarterectomy in 2020, HLD, currently enrolled in home hospice presented with new onset of left facial droop, left-sided weakness and slurred speech. On presentation code stroke was called. CT of the head showed acute stroke on right insula and operculum. Neurology was consulted.  PT recommended SNF   Subjective: Awake and watching television in bed.  No complaints when asked.  Initially when I went back to visit patient we assume she would be discharged today but I did explain to her that there may be some financial concerns that would preclude discharge.  Unfortunately allergy has been rescinded as above.    Objective: Vitals:   11/09/20 0757 11/09/20 0800  BP: (!) 149/63   Pulse: 60   Resp: 19 18  Temp: 98.7 F (37.1 C)   SpO2: 97%     Intake/Output Summary (Last 24 hours) at 11/09/2020 1220 Last data filed at 11/09/2020 0600 Gross per 24 hour  Intake --  Output 650 ml   Net -650 ml   Filed Weights   10/01/20 1338  Weight: 64 kg    Exam: General: Awake and alert.  Watching TV in no acute distress Respiratory: Lung sounds are clear to auscultation anteriorly.  Room air Cardiovascular: S1-S2.  No peripheral edema.  Regular pulse. Abdomen:  LBM 4/15, soft and nontender.  Bowel sounds are present.  Variable oral intake Neurologic: CN 2-12 grossly intact except for L facial droop-continues with aphasia.  Left upper extremity strength about 1-2/5 with right upper extremity strength about 3-4/5.  Sensation intact. Psychiatric: Alert and oriented times name and place.  Also oriented to year when offered choice between correct and incorrect year.  Very pleasant.  Smiles.   Assessment/Plan: Acute problems: Acute right-sided stroke, right MCA territory -Continues to have slurred speech and left-sided facial droop.CT of the head positve for CVA andMRI of brain showed multiple small acute right MCA territory infarcts. CTA w/ new proximal right M2 occlusion with distal reconstitution, severe intracranial atherosclerosis, unchanged 50% mid right common carotid artery stenosis.Left carotid endarterectomy patent without recurrent stenosis -Had stroke in April 2020 prior to carotid endarterectomy that resulted in severe aphasia which apparently had improved prior to current stroke. -Aspirin and Plavix per neurology for 3 weeks (which has been completed) followed by Plavix monotherapy -Echo shows EF of 55 to 60% with grade 1 diastolic dysfunction. -LDL 153 and cholesterol 220-continue statin -PT/OT- SNF recommended -Pt admitted to feelings of depression-Zoloft initiated  Pan sensitive E. Coli UTI -4/12 Discontinued  Rocephin in favor of Keflex -Patient has pure wick in place   Hypertension -SBP improved after resumption of home medications 4/8 -Continue Cozaar and Norvasc    Advanced dementia -Continue Aricept and Namenda -Fall and delirium precautions.    Moderate protein calorie malnutrition  Nutrition Problem: Moderate Malnutrition Etiology: chronic illness (advanced dementia; s/p R MCA in 2020, declined since) Signs/Symptoms: moderate muscle depletion,mild muscle depletion,mild fat depletion Interventions: Ensure Enlive (each supplement provides 350kcal and 20 grams of protein),Magic cup,MVI Estimated body mass index is 24.2 kg/m as calculated from the following:   Height as of this encounter: 5\' 4"  (1.626 m).   Weight as of this encounter: 64 kg. -Continue supplements -Eats better if foods are placed in a bowl where she can use a spoon to scoop up more easily -Reevaluated by SLP on 4/7 w/ MBSS  recommended contine D2 diet and thin liquids    Other problems:  Seizure disorder -Continue p.o. Keppra  Hyperlipidemia -Continue statin   Data Reviewed: Basic Metabolic Panel: No results for input(s): NA, K, CL, CO2, GLUCOSE, BUN, CREATININE, CALCIUM, MG, PHOS in the last 168 hours. Liver Function Tests: No results for input(s): AST, ALT, ALKPHOS, BILITOT, PROT, ALBUMIN in the last 168 hours. No results for input(s): LIPASE, AMYLASE in the last 168 hours. No results for input(s): AMMONIA in the last 168 hours. CBC: No results for input(s): WBC, NEUTROABS, HGB, HCT, MCV, PLT in the last 168 hours. Cardiac Enzymes: No results for input(s): CKTOTAL, CKMB, CKMBINDEX, TROPONINI in the last 168 hours. BNP (last 3 results) No results for input(s): BNP in the last 8760 hours.  ProBNP (last 3 results) No results for input(s): PROBNP in the last 8760 hours.  CBG: No results for input(s): GLUCAP in the last 168 hours.  Recent Results (from the past 240 hour(s))  SARS CORONAVIRUS 2 (TAT 6-24 HRS) Nasopharyngeal Nasopharyngeal Swab     Status: None   Collection Time: 11/08/20  2:23 PM   Specimen: Nasopharyngeal Swab  Result Value Ref Range Status   SARS Coronavirus 2 NEGATIVE NEGATIVE Final    Comment: (NOTE) SARS-CoV-2 target  nucleic acids are NOT DETECTED.  The SARS-CoV-2 RNA is generally detectable in upper and lower respiratory specimens during the acute phase of infection. Negative results do not preclude SARS-CoV-2 infection, do not rule out co-infections with other pathogens, and should not be used as the sole basis for treatment or other patient management decisions. Negative results must be combined with clinical observations, patient history, and epidemiological information. The expected result is Negative.  Fact Sheet for Patients: SugarRoll.be  Fact Sheet for Healthcare Providers: https://www.woods-mathews.com/  This test is not yet approved or cleared by the [place] FDA and  has been authorized for detection and/or diagnosis of SARS-CoV-2 by FDA under an Emergency Use Authorization (EUA). This EUA will remain  in effect (meaning this test can be used) for the duration of the COVID-19 declaration under Se ction 564(b)(1) of the Act, 21 U.S.C. section 360bbb-3(b)(1), unless the authorization is terminated or revoked sooner.  Performed at [facility], [place] [address], [place], [place] [address]      Studies: No results found.  Scheduled Meds: . amLODipine  10 mg Oral Daily  . atorvastatin  80 mg Oral q1800  . clopidogrel  75 mg Oral Daily  . donepezil  5 mg Oral QHS  . enoxaparin (LOVENOX) injection  40 mg Subcutaneous Q24H  . feeding supplement  237 mL Oral TID BM  .  levETIRAcetam  750 mg Oral BID  . losartan  25 mg Oral Daily  . melatonin  3 mg Oral QHS  . memantine  10 mg Oral Daily  . multivitamin with minerals  1 tablet Oral Daily  . sertraline  25 mg Oral Daily  . sodium chloride flush  3 mL Intravenous Once   Continuous Infusions:   Active Problems:   COPD (chronic obstructive pulmonary disease) (HCC)   Acute ischemic stroke (HCC)   Stroke (cerebrum) (HCC)   Malnutrition of moderate degree   Hospice care patient    Physical deconditioning   Dysphagia, pharyngoesophageal phase   Reactive depression   Abnormal urinalysis   E. coli UTI   Consultants:  Neurology  Palliative medicine  Procedures:  2D echocardiogram  Antibiotics:   Anti-infectives (From admission, onward)   Start     Dose/Rate Route Frequency Ordered Stop   11/01/20 1100  cephALEXin (KEFLEX) capsule 500 mg        500 mg Oral Every 12 hours 11/01/20 1012 11/04/20 2110   10/31/20 1030  cefTRIAXone (ROCEPHIN) 1 g in sodium chloride 0.9 % 100 mL IVPB  Status:  Discontinued        1 g 200 mL/hr over 30 Minutes Intravenous Every 24 hours 10/31/20 0935 11/01/20 1012       Time spent: 20 minutes    [name] ANP  [facility] 7 am - 330 pm/M-F for direct patient care and secure chat Please refer to Amion for contact info 39  days

## 2020-11-09 NOTE — Discharge Summary (Addendum)
Physician Discharge Summary  Kristy Cabrera LZJ:[id number] DOB: 03-03-1951 DOA: 10/01/2020  PCP: [name], NP  Admit date: 10/01/2020 Discharge date: 11/10/2020  Time spent: 43 minutes  Recommendations for Outpatient Follow-up:  1. Please call the neurology physician/Dr. Leonie Man to arrange for office follow-up after recent inpatient treatment for stroke 2. Patient will discharge to [facility] facility 3. Continue physical therapy, Occupational Therapy and speech therapy with speech therapy focused on both linguistics and swallowing   Discharge Diagnoses:  Active Problems:   COPD (chronic obstructive pulmonary disease) (HCC)   Acute ischemic stroke (HCC)   Stroke (cerebrum) (HCC)   Malnutrition of moderate degree   Hospice care patient   Physical deconditioning   Dysphagia, pharyngoesophageal phase   Reactive depression   Abnormal urinalysis   E. coli UTI   Discharge Condition: Stable  Diet recommendation: Heart healthy dysphagia 3/mechanical soft with thin liquids  Filed Weights   10/01/20 1338  Weight: 64 kg    History of present illness:  70 y.o.femalewith medical history significant ofadvanced dementia, HTN, meningioma s/p resection, seizure, unspecified left MCA stroke s/pleft-sided endarterectomy in 2020, HLD, currently enrolled in home hospice presented with new onset of left facial droop, left-sided weakness and slurred speech. On presentation code stroke was called. CT of the head showed acute stroke on right insula and operculum. Neurology was consulted. PT recommended SNF.  Patient's husband is disabled and an amputee and is unable to manage patient at home given her mobility and feeding deficits.  Patient is able to mobilize 10 feet with a rolling walker but requires moderate assist to get up and down and out of the bed.  Hospital Course:  Acute problems: Acute right-sided stroke, right MCA territory -Continues to have slurred speech and  left-sided facial droop.CT of the head positve for CVA andMRI of brain showed multiple small acute right MCA territory infarcts. CTA w/ new proximal right M2 occlusion with distal reconstitution, severe intracranial atherosclerosis, unchanged 50% mid right common carotid artery stenosis.Left carotid endarterectomy patent without recurrent stenosis -Had stroke in April 2020 prior to carotid endarterectomy that resulted in severe aphasia which apparently had improved prior to current stroke. -Aspirin and Plavix per neurology for 3 weeks (which has been completed) followed by Plavix monotherapy -Echo shows EF of 55 to 60% with grade 1 diastolic dysfunction. -LDL 153 and cholesterol 220-continue statin -PT/OT- SNFrecommended -Pt admitted to feelings of depression-Zoloft initiated  Pan sensitive E. Coli UTI -4/12 Discontinued Rocephin in favor of Keflex -Patient has pure wick in place   Hypertension -SBP improved after resumption of home medications 4/8 -Continue Cozaar and Norvasc    Advanced dementia -Continue Aricept and Namenda -Fall and delirium precautions.   Moderate protein calorie malnutrition  Nutrition Problem: Moderate Malnutrition Etiology: chronic illness (advanced dementia; s/p R MCA in 2020, declined since) Signs/Symptoms: moderate muscle depletion,mild muscle depletion,mild fat depletion Interventions: Ensure Enlive (each supplement provides 350kcal and 20 grams of protein),Magic cup,MVI Estimated body mass index is 24.2 kg/m as calculated from the following:   Height as of this encounter: 5\' 4"  (1.626 m).   Weight as of this encounter: 64 kg. -Continue supplements -Eats better if foods are placed in a bowl where she can use a spoon to scoop up more easily -Reevaluated by SLP on 4/7 w/ MBSS  recommended contine D2 diet and thin liquids    Other problems:  Seizure disorder -Continue p.o. Keppra  Hyperlipidemia -Continue  statin  Procedures:  Neurology  Palliative medicine  Consultations:  Neurology  Palliative   Discharge Exam: Vitals:   11/10/20 0542 11/10/20 0952  BP: (!) 145/68 125/61  Pulse: 68   Resp: 18   Temp: 98.2 F (36.8 C)   SpO2: 98%     General: Sleeping but awakened easily.  In no acute distress Respiratory: Anterior lung sounds clear.  Stable on room air Cardiovascular: S1-S2.  No peripheral edema.  Regular pulse. Abdomen:  LBM 4/15, soft and nontender.  Bowel sounds are present.  Variable oral intake Neurologic: CN 2-12 grossly intact except for L facial droop-continues with aphasia.  Left upper extremity strength about 1-2/5 with right upper extremity strength about 3-4/5.  Sensation intact. Psychiatric:    Discharge Instructions   Discharge Instructions    Diet - low sodium heart healthy   Complete by: As directed    Diet - low sodium heart healthy   Complete by: As directed    Dysphagia 3 with thin liquids   Increase activity slowly   Complete by: As directed    Increase activity slowly   Complete by: As directed      Allergies as of 11/10/2020   No Known Allergies     Medication List    TAKE these medications   amLODipine 10 MG tablet Commonly known as: NORVASC Take 1 tablet (10 mg total) by mouth daily. What changed:   medication strength  how much to take   calcium-vitamin D 500-200 MG-UNIT tablet Commonly known as: OSCAL WITH D Take 1 tablet by mouth 2 (two) times daily.   clopidogrel 75 MG tablet Commonly known as: PLAVIX Take 75 mg by mouth daily.   donepezil 5 MG tablet Commonly known as: ARICEPT Take 5 mg by mouth at bedtime.   feeding supplement Liqd Take 237 mLs by mouth 3 (three) times daily between meals.   levETIRAcetam 750 MG tablet Commonly known as: KEPPRA Take 750 mg by mouth 2 (two) times daily.   loperamide 2 MG tablet Commonly known as: IMODIUM A-D Take 2 mg by mouth every other day.   losartan 25 MG  tablet Commonly known as: COZAAR Take 1 tablet (25 mg total) by mouth daily.   Melatonin 3 MG Caps Take 3 mg by mouth at bedtime.   memantine 10 MG tablet Commonly known as: NAMENDA Take 10 mg by mouth daily.   rosuvastatin 20 MG tablet Commonly known as: Crestor Take 1 tablet (20 mg total) by mouth daily.   sertraline 25 MG tablet Commonly known as: ZOLOFT Take 1 tablet (25 mg total) by mouth daily.   SUPER B COMPLEX PO Take 1 tablet by mouth daily.   vitamin B-1 250 MG tablet Take 250 mg by mouth daily.      No Known Allergies  Contact information for follow-up providers    Garvin Fila, MD. Schedule an appointment as soon as possible for a visit in 3 week(s).   Specialties: Neurology, Radiology Why: Please call the neurology office to schedule routine hospital follow-up appointment after treatment for recent stroke. Contact information: [address] [place] [address] [phone]            Contact information for after-discharge care    [facility] .   Service: Skilled Nursing Contact information: [place] [phone]                   The results of significant diagnostics from this  hospitalization (including imaging, microbiology, ancillary and laboratory) are listed below for reference.    Significant Diagnostic Studies: DG CHEST PORT 1 VIEW  Result Date: 10/20/2020 CLINICAL DATA:  Cough, stroke EXAM: PORTABLE CHEST 1 VIEW COMPARISON:  10/18/2019 FINDINGS: Heart is upper limits normal in size. Right base atelectasis. Aortic atherosclerosis crash that no confluent opacity on the left. No effusions. Aortic atherosclerosis. Biapical scarring. IMPRESSION: Right basilar atelectasis.  Biapical scarring. No active disease. Electronically Signed   By: Rolm Baptise M.D.   On: 10/20/2020 19:24   DG Swallowing Func-Speech Pathology  Result Date: 10/26/2020 Objective  Swallowing Evaluation: Type of Study: MBS-Modified Barium Swallow Study  Patient Details Name: Kristy Cabrera MRN: [id number] Date of Birth: [birth year] Today's Date: 10/26/2020 Time: SLP Start Time (ACUTE ONLY): 1035 -SLP Stop Time (ACUTE ONLY): 1055 SLP Time Calculation (min) (ACUTE ONLY): 20 min Past Medical History: Past Medical History: Diagnosis Date . Brain tumor (benign) ([place])  . Dementia ([place])  . Episodic mood disorder ([place])  . Hyperlipidemia  . Hypertension  . Insomnia  . Osteopenia  . Weakness of both legs  Past Surgical History: Past Surgical History: Procedure Laterality Date . Brain tumor removal   . ENDARTERECTOMY Left 10/28/2018  Procedure: Left Carotid ENDARTERECTOMY;  Surgeon: [name], MD;  Location: [facility];  Service: Vascular;  Laterality: Left; [facility] MELANOMA EXCISION  1987  BACK HPI: 70 y.o. female with medical history significant for advanced dementia, HTN, meningioma s/p resection, seizure, unspecified left MCA stroke s/p left-sided endarterectomy in 2020, HLD, currently enrolled in home hospice presented with new onset of left facial droop, left-sided weakness and slurred speech.  CT of the head showed acute stroke on right insula and operculum. PTA pt fed herself a regular diet. MBSS 10/09/20 indicated intermittent aspiration with thin liquids that was inconsistently sensed with diet recommendations of nectar thick and regular textures.  Subjective: alert, attempting to communicate Assessment / Plan / Recommendation CHL IP CLINICAL IMPRESSIONS 10/26/2020 Clinical Impression Pt continues to present with oropharyngeal dysphagia characterized by reduced bolus awareness, impaired mastication, impaired posterior bolus propulsion, and a pharyngeal delay. Pt demonstrated oral holding accross boluses which persisted despite verbal and tactile cues. Lingual pumping was intermittently observed during attempts at posterior bolus propulsion. Mastication was inadequate for bolus size and consistency with regular  texture solids and oral holding was most significant with purees. Pt repeatedly requested thin liquids to assist with mastication and A-P transport. Transient penetration (PAS 2) was noted with thin liquids via straw, but this is considered to be WNL and no aspiration was noted.Overall, the pharyngeal phase of the pt's swallow is improved compared to the last study on 10/09/20; however, she has demonstrated more significant oral phase deficits which are likely cognitively based. Considering her presentation at bedside, SLP anticipates that depth of laryngeal invasion increases with suboptimal positioning and when pt is less able to attend to p.o. intake. Pt's diet will be advanced to dysphagia 2 solids and thin liquids via cup. SLP will continue to follow for dysphagia treatment. SLP Visit Diagnosis Dysphagia, oropharyngeal phase (R13.12) Attention and concentration deficit following -- Frontal lobe and executive function deficit following -- Impact on safety and function Mild aspiration risk;Moderate aspiration risk   CHL IP TREATMENT RECOMMENDATION 10/26/2020 Treatment Recommendations Therapy as outlined in treatment plan below   Prognosis 10/26/2020 Prognosis for Safe Diet Advancement Good Barriers to Reach Goals Cognitive deficits Barriers/Prognosis Comment -- CHL IP DIET RECOMMENDATION 10/26/2020 SLP Diet Recommendations Dysphagia 2 (Fine chop) solids;Thin  liquid Liquid Administration via Cup;No straw Medication Administration Crushed with puree Compensations Slow rate;Small sips/bites Postural Changes Seated upright at 90 degrees   CHL IP OTHER RECOMMENDATIONS 10/26/2020 Recommended Consults -- Oral Care Recommendations Oral care BID Other Recommendations --   CHL IP FOLLOW UP RECOMMENDATIONS 10/26/2020 Follow up Recommendations Skilled Nursing facility   [facility] IP FREQUENCY AND DURATION 10/26/2020 Speech Therapy Frequency (ACUTE ONLY) min 2x/week Treatment Duration 2 weeks      CHL IP ORAL PHASE 10/26/2020 Oral Phase Impaired  Oral - Pudding Teaspoon -- Oral - Pudding Cup -- Oral - Honey Teaspoon -- Oral - Honey Cup -- Oral - Nectar Teaspoon -- Oral - Nectar Cup -- Oral - Nectar Straw Reduced posterior propulsion;Holding of bolus;Lingual pumping Oral - Thin Teaspoon -- Oral - Thin Cup Reduced posterior propulsion;Holding of bolus;Lingual pumping;Delayed oral transit Oral - Thin Straw Reduced posterior propulsion;Holding of bolus;Lingual pumping;Delayed oral transit Oral - Puree Reduced posterior propulsion;Holding of bolus;Lingual pumping;Delayed oral transit Oral - Mech Soft -- Oral - Regular Reduced posterior propulsion;Holding of bolus;Lingual pumping;Delayed oral transit Oral - Multi-Consistency -- Oral - Pill Reduced posterior propulsion;Holding of bolus;Lingual pumping;Delayed oral transit Oral Phase - Comment --  CHL IP PHARYNGEAL PHASE 10/26/2020 Pharyngeal Phase Impaired Pharyngeal- Pudding Teaspoon -- Pharyngeal -- Pharyngeal- Pudding Cup -- Pharyngeal -- Pharyngeal- Honey Teaspoon -- Pharyngeal -- Pharyngeal- Honey Cup -- Pharyngeal -- Pharyngeal- Nectar Teaspoon -- Pharyngeal -- Pharyngeal- Nectar Cup -- Pharyngeal -- Pharyngeal- Nectar Straw Delayed swallow initiation-vallecula;Delayed swallow initiation-pyriform sinuses Pharyngeal -- Pharyngeal- Thin Teaspoon -- Pharyngeal -- Pharyngeal- Thin Cup Delayed swallow initiation-vallecula;Penetration/Aspiration during swallow Pharyngeal Material enters airway, remains ABOVE vocal cords then ejected out Pharyngeal- Thin Straw Delayed swallow initiation-vallecula;Penetration/Aspiration during swallow Pharyngeal Material enters airway, remains ABOVE vocal cords then ejected out Pharyngeal- Puree Delayed swallow initiation-vallecula Pharyngeal -- Pharyngeal- Mechanical Soft -- Pharyngeal -- Pharyngeal- Regular Delayed swallow initiation-vallecula Pharyngeal -- Pharyngeal- Multi-consistency -- Pharyngeal -- Pharyngeal- Pill Delayed swallow initiation-vallecula Pharyngeal -- Pharyngeal  Comment --  CHL IP CERVICAL ESOPHAGEAL PHASE 10/26/2020 Cervical Esophageal Phase WFL Pudding Teaspoon -- Pudding Cup -- Honey Teaspoon -- Honey Cup -- Nectar Teaspoon -- Nectar Cup -- Nectar Straw -- Thin Teaspoon -- Thin Cup -- Thin Straw -- Puree -- Mechanical Soft -- Regular -- Multi-consistency -- Pill -- Cervical Esophageal Comment -- Kristy Cabrera, Kristy Cabrera, Kristy Cabrera Office number [phone] Pager [place] 10/26/2020, 1:30 PM               Microbiology: Recent Results (from the past 240 hour(s))  SARS CORONAVIRUS 2 (TAT 6-24 HRS) Nasopharyngeal Nasopharyngeal Swab     Status: None   Collection Time: 11/08/20  2:23 PM   Specimen: Nasopharyngeal Swab  Result Value Ref Range Status   SARS Coronavirus 2 NEGATIVE NEGATIVE Final    Comment: (NOTE) SARS-CoV-2 target nucleic acids are NOT DETECTED.  The SARS-CoV-2 RNA is generally detectable in upper and lower respiratory specimens during the acute phase of infection. Negative results do not preclude SARS-CoV-2 infection, do not rule out co-infections with other pathogens, and should not be used as the sole basis for treatment or other patient management decisions. Negative results must be combined with clinical observations, patient history, and epidemiological information. The expected result is Negative.  Fact Sheet for Patients: SugarRoll.be  Fact Sheet for Healthcare Providers: https://www.woods-mathews.com/  This test is not yet approved or cleared by the [place] FDA and  has been authorized for detection and/or diagnosis of SARS-CoV-2 by FDA  under an Emergency Use Authorization (EUA). This EUA will remain  in effect (meaning this test can be used) for the duration of the COVID-19 declaration under Se ction 564(b)(1) of the Act, 21 U.S.C. section 360bbb-3(b)(1), unless the authorization is terminated or revoked sooner.  Performed  at [facility], [place] [address] [place], [place] [address]      Labs: Basic Metabolic Panel: No results for input(s): NA, K, CL, CO2, GLUCOSE, BUN, CREATININE, CALCIUM, MG, PHOS in the last 168 hours. Liver Function Tests: No results for input(s): AST, ALT, ALKPHOS, BILITOT, PROT, ALBUMIN in the last 168 hours. No results for input(s): LIPASE, AMYLASE in the last 168 hours. No results for input(s): AMMONIA in the last 168 hours. CBC: No results for input(s): WBC, NEUTROABS, HGB, HCT, MCV, PLT in the last 168 hours. Cardiac Enzymes: No results for input(s): CKTOTAL, CKMB, CKMBINDEX, TROPONINI in the last 168 hours. BNP: BNP (last 3 results) No results for input(s): BNP in the last 8760 hours.  ProBNP (last 3 results) No results for input(s): PROBNP in the last 8760 hours.  CBG: No results for input(s): GLUCAP in the last 168 hours.     Signed:  Barb Merino ANP [facility] 11/10/2020, 10:11 AM  Patient was seen, examined,treatment plan was discussed with the Advance Practice Provider. I have personally reviewed the clinical findings, labs, imaging studies and management of this patient in detail. I have also reviewed the orders written for this patient which were under my direction. I agree with the documentation, as recorded by the Advance Practice Provider.  Anahla Bevis is a 70 y.o. female with a Past Medical History of prolonged hospitalization due to placement problem.  She is able to be discharged today.  Stable for discharge.  Med rec reviewed as above. Barb Merino, MD

## 2020-11-09 NOTE — Progress Notes (Addendum)
1pm: CSW received confirmation from [facility] stating she had spoken with business office manager to discuss LOG. Patient can come to the facility early on 11/09/20.  CSW updated patient's husband [name] and NP of plan.  9:20am: CSW spoke with Rasheema at [facility] office is requiring [facility] leadership call to discuss the LOG agreement prior to allowing the patient to come to the facility.  CSW left additional voicemail with patient's Medicaid worker Doretha at [facility] requesting a return call.   Madilyn Fireman, MSW, LCSW Transitions of Care  Clinical Social Worker II [phone]

## 2020-11-10 NOTE — Progress Notes (Signed)
Occupational Therapy Treatment Patient Details Name: Kristy Cabrera MRN: [id number] DOB: 01-20-1951 Today's Date: 11/10/2020    History of present illness Pt is 70 y.o. female presented 3/13 with new onset of left facial droop, left-sided weakness and slurred speech. CT of the head showed acute stroke on right insula and operculum.  Pt remains hospitalized due to difficult to place - insurance has denied SNF , working on [employer] as spouse unable to care for pt at home.  PMH:  advanced dementia, HTN, meningioma s/p resection, seizure, unspecified left MCA stroke s/p left-sided endarterectomy in 2020, HLD, currently enrolled in home hospice   OT comments  Pt making progress with functional goals. Session focused on sitting EOB, UB dressing, LB dressing, sit - stand with RW, SPT to [facility] and toileting. Pt pleasant and cooperative. Pt continues to present with impaired motor planning, baseline cognitive deficits, and impaired balance  Follow Up Recommendations  SNF;Supervision/Assistance - 24 hour    Equipment Recommendations  3 in 1 bedside commode;Other (comment) (TBD at SNF)    Recommendations for Other Services      Precautions / Restrictions Precautions Precautions: Fall;Other (comment) Precaution Comments: advanced dementia Restrictions Weight Bearing Restrictions: No       Mobility Bed Mobility Overal bed mobility: Needs Assistance Bed Mobility: Supine to Sit;Sit to Supine     Supine to sit: Min assist Sit to supine: Min assist        Transfers Overall transfer level: Needs assistance Equipment used: Rolling walker (2 wheeled) Transfers: Sit to/from [facility] Sit to Stand: Mod assist Stand pivot transfers: Min assist            Balance Overall balance assessment: Needs assistance Sitting-balance support: Feet supported;Bilateral upper extremity supported Sitting balance-Leahy Scale: Fair Sitting balance - Comments: pt sitting supported and  unsupported; but fatigues easily unsupported. Postural control: Right lateral lean;Posterior lean Standing balance support: Bilateral upper extremity supported;During functional activity Standing balance-Leahy Scale: Poor                             ADL either performed or assessed with clinical judgement   ADL Overall ADL's : Needs assistance/impaired     Grooming: Wash/dry face;Wash/dry hands;Min guard;Brushing hair;Sitting           Upper Body Dressing : Sitting;Min guard   Lower Body Dressing: Minimal assistance Lower Body Dressing Details (indicate cue type and reason): able to donn socks min A seated EOB Toilet Transfer: Moderate assistance;Minimal assistance;RW;Stand-pivot;Cueing for safety;Cueing for sequencing   Toileting- Clothing Manipulation and Hygiene: Sit to/from stand;Total assistance Toileting - Clothing Manipulation Details (indicate cue type and reason): posterior peri hygiene standing at EOB with RW     Functional mobility during ADLs: Moderate assistance;Minimal assistance;Rolling walker;Cueing for safety;Cueing for sequencing General ADL Comments: pt continues to present with impaired motor planning, baseline cognitive deficits, and impaired balance     Vision Patient Visual Report: No change from baseline     Perception     Praxis      Cognition Arousal/Alertness: Awake/alert Behavior During Therapy: WFL for tasks assessed/performed Overall Cognitive Status: History of cognitive impairments - at baseline                                 General Comments: baseline dementia; following most commands        Exercises     Shoulder  Instructions       General Comments      Pertinent Vitals/ Pain       Pain Assessment: No/denies pain Faces Pain Scale: No hurt Pain Intervention(s): Monitored during session;Repositioned  Home Living                                          Prior  Functioning/Environment              Frequency  Min 2X/week        Progress Toward Goals  OT Goals(current goals can now be found in the care plan section)  Progress towards OT goals: Progressing toward goals     Plan Discharge plan remains appropriate;Frequency remains appropriate    Co-evaluation                 AM-PAC OT "6 Clicks" Daily Activity     Outcome Measure   Help from another person eating meals?: A Little Help from another person taking care of personal grooming?: A Little Help from another person toileting, which includes using toliet, bedpan, or urinal?: A Lot Help from another person bathing (including washing, rinsing, drying)?: A Lot Help from another person to put on and taking off regular upper body clothing?: A Little Help from another person to put on and taking off regular lower body clothing?: A Lot 6 Click Score: 15    End of Session Equipment Utilized During Treatment: Gait belt;Rolling walker  OT Visit Diagnosis: Unsteadiness on feet (R26.81);Muscle weakness (generalized) (M62.81);Cognitive communication deficit (R41.841) Symptoms and signs involving cognitive functions: Other cerebrovascular disease   Activity Tolerance Patient tolerated treatment well   Patient Left with call bell/phone within reach;in bed;with bed alarm set   Nurse Communication          Time: 1016-1040 OT Time Calculation (min): 24 min  Charges: OT General Charges $OT Visit: 1 Visit OT Treatments $Self Care/Home Management : 8-22 mins $Therapeutic Activity: 8-22 mins     Britt Bottom 11/10/2020, 12:57 PM

## 2020-11-10 NOTE — Plan of Care (Signed)
1059: Got a call from Landa from [facility] ([place]) for report. Questions and concerns answered. Transportation team here to pick up the patient.  1105: Patient discharged via stretcher with transport team. Patient hemodynamically stable during discharge.   Problem: Education: Goal: Knowledge of General Education information will improve Description: Including pain rating scale, medication(s)/side effects and non-pharmacologic comfort measures Outcome: Adequate for Discharge   Problem: Health Behavior/Discharge Planning: Goal: Ability to manage health-related needs will improve Outcome: Adequate for Discharge   Problem: Clinical Measurements: Goal: Will remain free from infection Outcome: Adequate for Discharge Goal: Diagnostic test results will improve Outcome: Adequate for Discharge   Problem: Activity: Goal: Risk for activity intolerance will decrease Outcome: Adequate for Discharge   Problem: Nutrition: Goal: Adequate nutrition will be maintained Outcome: Adequate for Discharge   Problem: Pain Managment: Goal: General experience of comfort will improve Outcome: Adequate for Discharge   Problem: Safety: Goal: Ability to remain free from injury will improve Outcome: Adequate for Discharge   Problem: Skin Integrity: Goal: Risk for impaired skin integrity will decrease Outcome: Adequate for Discharge   Problem: Education: Goal: Knowledge of disease or condition will improve Outcome: Adequate for Discharge Goal: Knowledge of secondary prevention will improve Outcome: Adequate for Discharge Goal: Knowledge of patient specific risk factors addressed and post discharge goals established will improve Outcome: Adequate for Discharge Goal: Individualized Educational Video(s) Outcome: Adequate for Discharge   Problem: Coping: Goal: Will verbalize positive feelings about self Outcome: Adequate for Discharge Goal: Will identify appropriate support needs Outcome: Adequate for  Discharge   Problem: Health Behavior/Discharge Planning: Goal: Ability to manage health-related needs will improve Outcome: Adequate for Discharge   Problem: Self-Care: Goal: Ability to participate in self-care as condition permits will improve Outcome: Adequate for Discharge Goal: Verbalization of feelings and concerns over difficulty with self-care will improve Outcome: Adequate for Discharge Goal: Ability to communicate needs accurately will improve Outcome: Adequate for Discharge   Problem: Nutrition: Goal: Risk of aspiration will decrease Outcome: Adequate for Discharge Goal: Dietary intake will improve Outcome: Adequate for Discharge   Problem: Ischemic Stroke/TIA Tissue Perfusion: Goal: Complications of ischemic stroke/TIA will be minimized Outcome: Adequate for Discharge

## 2020-11-10 NOTE — Progress Notes (Signed)
Attempted to call report to [facility] [phone]) but no answer. Will try later.

## 2020-11-10 NOTE — Progress Notes (Addendum)
10am: Patient will go to room 204S-A at [facility] - she will be transported via [facility]. Transportation has been arranged for first available. The number to call for report is [phone].  RN and patient's husband aware of plan.  9:45am: CSW spoke with Doretha at [facility] - she provided patient's pending Medicaid number #[id number] S.  :30am: CSW spoke with [facility] who states that since patient is discharging to [facility] under an LOG/Medicaid potential, she can keep hospice services. The hospice services will not impact SNF services.  Madilyn Fireman, MSW, LCSW Transitions of Care  Clinical Social Worker II [phone]

## 2020-11-11 DIAGNOSIS — I639 Cerebral infarction, unspecified: Secondary | ICD-10-CM | POA: Diagnosis not present

## 2020-11-11 DIAGNOSIS — R278 Other lack of coordination: Secondary | ICD-10-CM | POA: Diagnosis not present

## 2020-11-15 DIAGNOSIS — R1310 Dysphagia, unspecified: Secondary | ICD-10-CM | POA: Diagnosis not present

## 2020-11-15 DIAGNOSIS — E440 Moderate protein-calorie malnutrition: Secondary | ICD-10-CM | POA: Diagnosis not present

## 2020-11-15 DIAGNOSIS — I639 Cerebral infarction, unspecified: Secondary | ICD-10-CM | POA: Diagnosis not present

## 2020-11-15 DIAGNOSIS — F329 Major depressive disorder, single episode, unspecified: Secondary | ICD-10-CM | POA: Diagnosis not present

## 2020-11-15 DIAGNOSIS — N390 Urinary tract infection, site not specified: Secondary | ICD-10-CM | POA: Diagnosis not present

## 2020-11-15 DIAGNOSIS — J449 Chronic obstructive pulmonary disease, unspecified: Secondary | ICD-10-CM | POA: Diagnosis not present

## 2020-12-06 DIAGNOSIS — J449 Chronic obstructive pulmonary disease, unspecified: Secondary | ICD-10-CM | POA: Diagnosis not present

## 2020-12-06 DIAGNOSIS — E440 Moderate protein-calorie malnutrition: Secondary | ICD-10-CM | POA: Diagnosis not present

## 2020-12-06 DIAGNOSIS — I639 Cerebral infarction, unspecified: Secondary | ICD-10-CM | POA: Diagnosis not present

## 2020-12-06 DIAGNOSIS — R1310 Dysphagia, unspecified: Secondary | ICD-10-CM | POA: Diagnosis not present

## 2020-12-19 ENCOUNTER — Encounter: Payer: Self-pay | Admitting: Diagnostic Neuroimaging

## 2020-12-19 ENCOUNTER — Ambulatory Visit (INDEPENDENT_AMBULATORY_CARE_PROVIDER_SITE_OTHER): Payer: PPO | Admitting: Diagnostic Neuroimaging

## 2020-12-19 VITALS — BP 138/80 | HR 68

## 2020-12-19 DIAGNOSIS — I63411 Cerebral infarction due to embolism of right middle cerebral artery: Secondary | ICD-10-CM

## 2020-12-19 DIAGNOSIS — D420 Neoplasm of uncertain behavior of cerebral meninges: Secondary | ICD-10-CM | POA: Diagnosis not present

## 2020-12-19 DIAGNOSIS — G40909 Epilepsy, unspecified, not intractable, without status epilepticus: Secondary | ICD-10-CM

## 2020-12-19 NOTE — Patient Instructions (Signed)
DEMENTIA - continue palliative care - optimize nutrition and physical activity  STROKE PREVENTION - continue clopidogrel 75, statin, BP control  SEIZURE PREVENTION - continue levetiracetam 750mg  twice a day

## 2020-12-19 NOTE — Progress Notes (Addendum)
[facility]  PATIENT: Kristy Cabrera DOB: [birth year]  REFERRING CLINICIAN: [name], NP  HISTORY FROM: patient and chart review REASON FOR VISIT: new consult    HISTORICAL  CHIEF COMPLAINT:  Chief Complaint  Patient presents with  . Cerebrovascular Accident    Rm 7 residing at [facility] and rehab    HISTORY OF PRESENT ILLNESS:   UPDATE (12/19/2020, VRP): 70 year old female here for evaluation of possible stroke follow-up.  Patient presented to hospital on March 2022 for slurred speech, left-sided weakness.  Patient had MRI of the brain which showed multiple right MCA acute infarcts as well as severe intracranial atherosclerosis. Also had been on levetiracetam for seizure prevention since March 2020.  Had left brain stroke and left carotid endarterectomy in April 2020.   PRIOR HPI (01/20/18, VRP): 70 year old female here for evaluation of memory loss.  2013 patient had personality changes, stop paying bills, was having withdrawn affect.  Patient also was drinking alcohol heavily at this time.  At some point patient had neuro imaging study which demonstrated a large frontal interhemispheric meningioma.  She underwent meningioma resection in 2015.  Unfortunately her cognitive and personality symptoms did not improve.  Patient continued to drink heavily at this time.  2017 patient progressively worsened in terms of memory.  Patient was admitted to the hospital in 2017 for acute encephalopathy, likely related to hyponatremia, alcohol abuse and alcohol withdrawal.  Memory loss cognitive decline have continued.  Earlier in 2019 patient significant cut down and drinking.  Now patient drinks alcohol 1 drink per day, 10 days out of the month.  Unfortunately memory loss problems have continued.   REVIEW OF SYSTEMS: Full 14 system review of systems performed and negative with exception of: Memory loss confusion sleepiness slurred speech depression decreased  energy hearing loss weight gain loss of vision blurred vision.  ALLERGIES: No Known Allergies  HOME MEDICATIONS: Outpatient Medications Prior to Visit  Medication Sig Dispense Refill  . acetaminophen (TYLENOL) 650 MG CR tablet Take 650 mg by mouth every 8 (eight) hours as needed for pain.    Marland Kitchen aluminum-magnesium hydroxide-simethicone (MAALOX) 846-962-95 MG/5ML SUSP Take 30 mLs by mouth 4 (four) times daily -  before meals and at bedtime.    Marland Kitchen amLODipine (NORVASC) 10 MG tablet Take 1 tablet (10 mg total) by mouth daily.    . calcium-vitamin D (OSCAL WITH D) 500-200 MG-UNIT tablet Take 1 tablet by mouth 2 (two) times daily.    . clopidogrel (PLAVIX) 75 MG tablet Take 75 mg by mouth daily.    Marland Kitchen donepezil (ARICEPT) 5 MG tablet Take 5 mg by mouth at bedtime.    . feeding supplement (BOOST HIGH PROTEIN) LIQD Take 1 Container by mouth 3 (three) times daily between meals.    . levETIRAcetam (KEPPRA) 750 MG tablet Take 750 mg by mouth 2 (two) times daily.    Marland Kitchen loperamide (IMODIUM A-D) 2 MG tablet Take 2 mg by mouth every other day.    . losartan (COZAAR) 25 MG tablet Take 1 tablet (25 mg total) by mouth daily. 30 tablet 0  . Melatonin 3 MG CAPS Take 3 mg by mouth at bedtime.    . sertraline (ZOLOFT) 25 MG tablet Take 1 tablet (25 mg total) by mouth daily.    . Thiamine HCl (VITAMIN B-1) 250 MG tablet Take 250 mg by mouth daily.    . B Complex-C (SUPER B COMPLEX PO) Take 1 tablet by mouth daily. (Patient not taking:  No sig reported)    . feeding supplement (ENSURE ENLIVE / ENSURE PLUS) LIQD Take 237 mLs by mouth 3 (three) times daily between meals. (Patient not taking: Reported on 12/19/2020) 237 mL 12  . memantine (NAMENDA) 10 MG tablet Take 10 mg by mouth daily.  (Patient not taking: No sig reported)    . rosuvastatin (CRESTOR) 20 MG tablet Take 1 tablet (20 mg total) by mouth daily. (Patient not taking: Reported on 12/19/2020) 30 tablet 11   No facility-administered medications prior to visit.     PAST MEDICAL HISTORY: Past Medical History:  Diagnosis Date  . Brain tumor (benign) ([place])   . Dementia ([place])   . Episodic mood disorder ([place])   . Hyperlipidemia   . Hypertension   . Insomnia   . Osteopenia   . Stroke ([place])   . Weakness of both legs     PAST SURGICAL HISTORY: Past Surgical History:  Procedure Laterality Date  . Brain tumor removal    . ENDARTERECTOMY Left 10/28/2018   Procedure: Left Carotid ENDARTERECTOMY;  Surgeon: [name], MD;  Location: [facility];  Service: Vascular;  Laterality: Left;  Marland Kitchen MELANOMA EXCISION  1987   BACK    FAMILY HISTORY: Family History  Problem Relation Age of Onset  . Diabetes Mother   . Breast cancer Mother   . Hypertension Mother   . Kidney disease Mother   . Arthritis Mother   . Brain cancer Mother   . Diabetes Sister   . Hypertension Sister   . Diabetes Brother   . Hypertension Brother   . Diabetes Maternal Grandmother   . Hypertension Maternal Grandmother   . Heart disease Maternal Grandmother   . Asthma Maternal Grandfather     SOCIAL HISTORY:  Social History   Socioeconomic History  . Marital status: Married    Spouse name: joseph  . Number of children: Not on file  . Years of education: Not on file  . Highest education level: Some college, no degree  Occupational History    Comment: disabled  Tobacco Use  . Smoking status: Former Smoker    Packs/day: 3.00    Types: Cigarettes    Quit date: 08/22/2014    Years since quitting: 6.3  . Smokeless tobacco: Never Used  Vaping Use  . Vaping Use: Never used  Substance and Sexual Activity  . Alcohol use: Not Currently    Comment: , quit 07/2014  . Drug use: No  . Sexual activity: Not on file  Other Topics Concern  . Not on file  Social History Narrative   Caffeine 4 servings daily.  Lives home with husband, [name].  Retired.     Social Determinants of Health   Financial Resource Strain: Not on file  Food Insecurity: Not on file  Transportation Needs:  Not on file  Physical Activity: Not on file  Stress: Not on file  Social Connections: Not on file  Intimate Partner Violence: Not on file     PHYSICAL EXAM  GENERAL EXAM/CONSTITUTIONAL: Vitals:  Vitals:   12/19/20 0913  BP: 138/80  Pulse: 68   There is no height or weight on file to calculate BMI. No exam data present  Patient is in no distress; well developed, nourished and groomed; neck is supple  CARDIOVASCULAR:  Examination of carotid arteries is normal; no carotid bruits  Regular rate and rhythm, no murmurs  Examination of peripheral vascular system by observation and palpation is normal  EYES:  Ophthalmoscopic exam of  optic discs and posterior segments is normal; no papilledema or hemorrhages  MUSCULOSKELETAL:  Gait, strength, tone, movements noted in Neurologic exam below  NEUROLOGIC: MENTAL STATUS:  MMSE - Mini Mental State Exam 01/20/2018  Orientation to time 5  Orientation to Place 2  Registration 3  Attention/ Calculation 3  Recall 3  Language- name 2 objects 2  Language- repeat 1  Language- follow 3 step command 2  Language- read & follow direction 1  Write a sentence 1  Copy design 1  Total score 24    awake, alert  MUTE  CAN FOLLOW SIMPLE COMMANDS AND IMITATE MOVEMENTS   CRANIAL NERVE:   2nd, 3rd, 4th, 6th - pupils equal and reactive to light, visual fields full to confrontation, extraocular muscles intact, no nystagmus  5th - facial sensation symmetric  7th - facial strength --> LEFT LOWER FACIAL WEAKNESS  8th - hearing intact   MOTOR:   normal bulk and tone, DIFFUSE 4 IN BUE, BLE  SENSORY:   normal and symmetric to light touch  COORDINATION:   finger-nose-finger, fine finger movements SLOW  REFLEXES:   deep tendon reflexes TRACE and symmetric  GAIT/STATION:   IN WHEELCHAIR    DIAGNOSTIC DATA (LABS, IMAGING, TESTING) - I reviewed patient records, labs, notes, testing and imaging myself where available.  Lab  Results  Component Value Date   WBC 10.1 10/28/2020   HGB 13.0 10/28/2020   HCT 39.6 10/28/2020   MCV 90.6 10/28/2020   PLT 344 10/28/2020      Component Value Date/Time   NA 141 10/28/2020 0221   K 3.8 10/28/2020 0221   CL 110 10/28/2020 0221   CO2 24 10/28/2020 0221   GLUCOSE 93 10/28/2020 0221   BUN 23 10/28/2020 0221   CREATININE 0.70 10/28/2020 0221   CALCIUM 9.4 10/28/2020 0221   PROT 6.4 (L) 10/21/2020 0206   ALBUMIN 3.2 (L) 10/21/2020 0206   AST 24 10/21/2020 0206   ALT 25 10/21/2020 0206   ALKPHOS 87 10/21/2020 0206   BILITOT 0.3 10/21/2020 0206   GFRNONAA >60 10/28/2020 0221   GFRAA >60 11/02/2018 0532   Lab Results  Component Value Date   CHOL 220 (H) 10/02/2020   HDL 53 10/02/2020   LDLCALC 153 (H) 10/02/2020   TRIG 72 10/02/2020   CHOLHDL 4.2 10/02/2020   Lab Results  Component Value Date   HGBA1C 5.7 (H) 10/02/2020   No results found for: [id number] Lab Results  Component Value Date   TSH 0.175 (L) 10/30/2018    07/04/16 MRI brain [I reviewed images myself and agree with interpretation. -VRP]  1. No acute intracranial process identified. 2. Bifrontal encephalomalacia and gliosis, suspected to be postoperative in nature related to prior meningioma resection. No residual enhancing tumor identified on this motion degraded study.  3. Mild chronic microvascular ischemic disease. 4. Chronic right maxillary and ethmoidal sinus disease.  10/01/20 MRI brain 1. Multiple small acute right MCA territory infarcts. 2. Chronic ischemia and bifrontal encephalomalacia as detailed above.  10/01/20 CTA head / neck 1. New proximal right M2 occlusion with distal reconstitution. 2. Similar appearance of advanced intracranial atherosclerosis elsewhere with severe anterior and posterior circulation stenoses as above. 3. Unchanged 50% mid right common carotid artery stenosis. 4. Patent left carotid endarterectomy without recurrent stenosis. 5. Unchanged severe right  and moderate left proximal subclavian artery stenoses. 6. Unchanged moderate right and mild left vertebral artery origin stenoses. 7. Aortic Atherosclerosis (ICD10-I70.0) and Emphysema (ICD10-J43.9).  10/02/20 TTE 1. Left  ventricular ejection fraction, by estimation, is 55 to 60%. The  left ventricle has normal function. The left ventricle has no regional  wall motion abnormalities. Left ventricular diastolic parameters are  consistent with Grade I diastolic  dysfunction (impaired relaxation).  2. Right ventricular systolic function is normal. The right ventricular  size is normal.  3. The mitral valve is grossly normal. No evidence of mitral valve  regurgitation.  4. The aortic valve is grossly normal. Aortic valve regurgitation is not  visualized.      ASSESSMENT AND PLAN  70 y.o. year old female here with history of personality and cognitive decline in 2013, likely related to underlying alcohol abuse and large interhemispheric frontal meningioma.  Patient continues to have progressive memory loss issues.  Now may also have superimposed neurodegenerative dementia.  Dx:  1. Cerebrovascular accident (CVA) due to embolism of right middle cerebral artery ([place])   2. Seizure disorder ([place])   3. Atypical meningioma of brain ([place])     PLAN:  DEMENTIA - continue palliative care - optimize nutrition and physical activity  STROKE PREVENTION - clopidogrel 75, statin, BP control  SEIZURE PREVENTION - levetiracetam 750mg  twice a day   Return follow up with palliative care, for return to PCP.    Penni Bombard, MD 1/77/1165, 7:90 AM Certified in Neurology, Neurophysiology and Neuroimaging  [facility] [address] [place], [place] [address] [phone]

## 2021-01-03 DIAGNOSIS — R1310 Dysphagia, unspecified: Secondary | ICD-10-CM | POA: Diagnosis not present

## 2021-01-03 DIAGNOSIS — F329 Major depressive disorder, single episode, unspecified: Secondary | ICD-10-CM | POA: Diagnosis not present

## 2021-01-03 DIAGNOSIS — I639 Cerebral infarction, unspecified: Secondary | ICD-10-CM | POA: Diagnosis not present

## 2021-01-03 DIAGNOSIS — E440 Moderate protein-calorie malnutrition: Secondary | ICD-10-CM | POA: Diagnosis not present

## 2021-01-03 DIAGNOSIS — J449 Chronic obstructive pulmonary disease, unspecified: Secondary | ICD-10-CM | POA: Diagnosis not present

## 2021-02-28 DIAGNOSIS — M6281 Muscle weakness (generalized): Secondary | ICD-10-CM | POA: Diagnosis not present

## 2021-02-28 DIAGNOSIS — I639 Cerebral infarction, unspecified: Secondary | ICD-10-CM | POA: Diagnosis not present

## 2021-02-28 DIAGNOSIS — I69320 Aphasia following cerebral infarction: Secondary | ICD-10-CM | POA: Diagnosis not present

## 2021-03-18 DIAGNOSIS — M6281 Muscle weakness (generalized): Secondary | ICD-10-CM | POA: Diagnosis not present

## 2021-03-18 DIAGNOSIS — I639 Cerebral infarction, unspecified: Secondary | ICD-10-CM | POA: Diagnosis not present

## 2021-03-18 DIAGNOSIS — I69320 Aphasia following cerebral infarction: Secondary | ICD-10-CM | POA: Diagnosis not present

## 2021-03-19 DIAGNOSIS — F0390 Unspecified dementia without behavioral disturbance: Secondary | ICD-10-CM | POA: Diagnosis not present

## 2021-03-19 DIAGNOSIS — R5381 Other malaise: Secondary | ICD-10-CM | POA: Diagnosis not present

## 2021-03-19 DIAGNOSIS — I679 Cerebrovascular disease, unspecified: Secondary | ICD-10-CM | POA: Diagnosis not present

## 2021-03-19 DIAGNOSIS — Z515 Encounter for palliative care: Secondary | ICD-10-CM | POA: Diagnosis not present

## 2021-03-22 DIAGNOSIS — M6281 Muscle weakness (generalized): Secondary | ICD-10-CM | POA: Diagnosis not present

## 2021-03-22 DIAGNOSIS — I639 Cerebral infarction, unspecified: Secondary | ICD-10-CM | POA: Diagnosis not present

## 2021-03-22 DIAGNOSIS — I69320 Aphasia following cerebral infarction: Secondary | ICD-10-CM | POA: Diagnosis not present

## 2021-04-09 DIAGNOSIS — I679 Cerebrovascular disease, unspecified: Secondary | ICD-10-CM | POA: Diagnosis not present

## 2021-04-09 DIAGNOSIS — Z515 Encounter for palliative care: Secondary | ICD-10-CM | POA: Diagnosis not present

## 2021-04-09 DIAGNOSIS — I69991 Dysphagia following unspecified cerebrovascular disease: Secondary | ICD-10-CM | POA: Diagnosis not present

## 2021-04-09 DIAGNOSIS — J449 Chronic obstructive pulmonary disease, unspecified: Secondary | ICD-10-CM | POA: Diagnosis not present

## 2021-04-24 DIAGNOSIS — I639 Cerebral infarction, unspecified: Secondary | ICD-10-CM | POA: Diagnosis not present

## 2021-04-24 DIAGNOSIS — M6281 Muscle weakness (generalized): Secondary | ICD-10-CM | POA: Diagnosis not present

## 2021-04-24 DIAGNOSIS — I69320 Aphasia following cerebral infarction: Secondary | ICD-10-CM | POA: Diagnosis not present

## 2021-05-04 DIAGNOSIS — R5381 Other malaise: Secondary | ICD-10-CM | POA: Diagnosis not present

## 2021-05-04 DIAGNOSIS — U071 COVID-19: Secondary | ICD-10-CM | POA: Diagnosis not present

## 2021-05-04 DIAGNOSIS — J449 Chronic obstructive pulmonary disease, unspecified: Secondary | ICD-10-CM | POA: Diagnosis not present

## 2021-06-23 IMAGING — CT CT HEAD CODE STROKE
3 series · 14 of 47 positions shown, 16 images · non-contrast
Comparison: Brain MRI 10/18/2019.  Head CT 10/18/2019 and earlier.

CLINICAL DATA: Code stroke. 69-year-old female with facial droop
and left side weakness.

EXAM:
CT HEAD WITHOUT CONTRAST
TECHNIQUE: Contiguous axial images were obtained from the base of the skull
through the vertex without intravenous contrast.

[Series 2: head 5.0 st · axial · 0.45mm/px · z∈[-108,+27]mm · 8 of 33 slices shown, 10 images]
[im 3/33  brain]
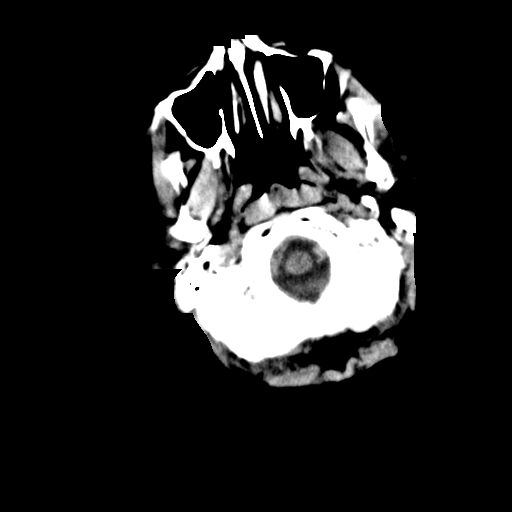
[im 3/33  bone]
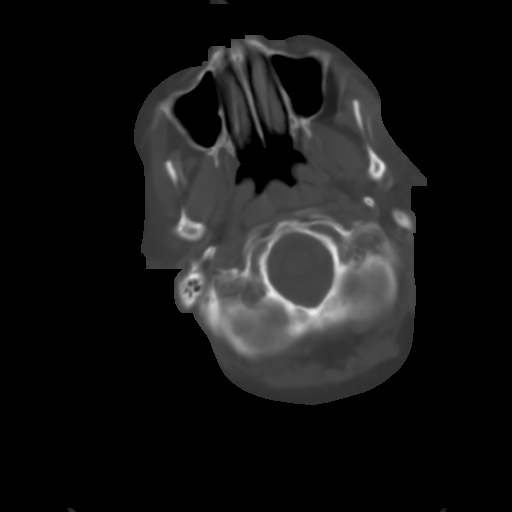
[im 7/33  brain]
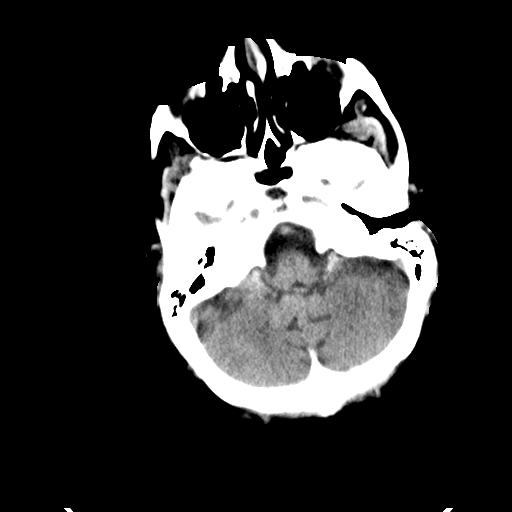
[im 10/33  brain]
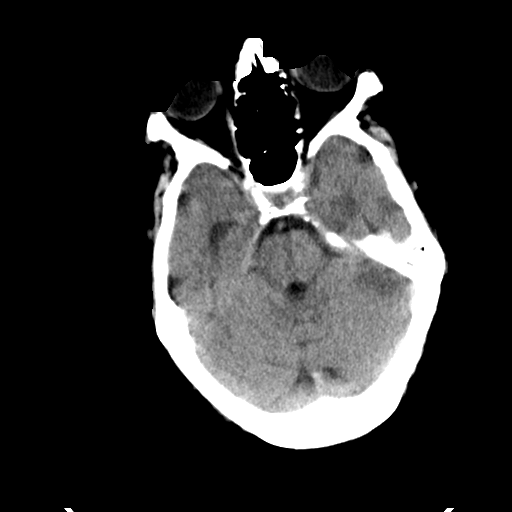
[im 15/33  brain]
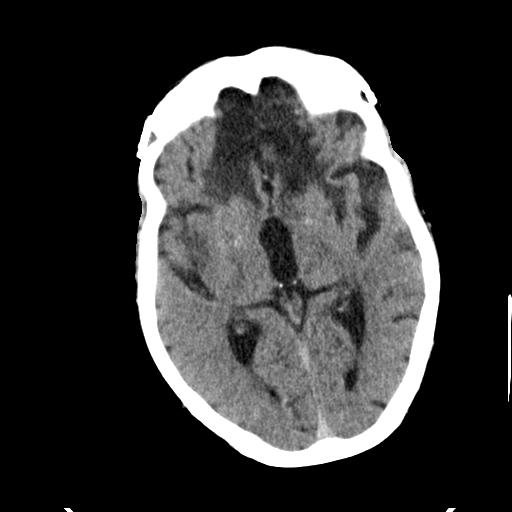
[im 18/33  brain]
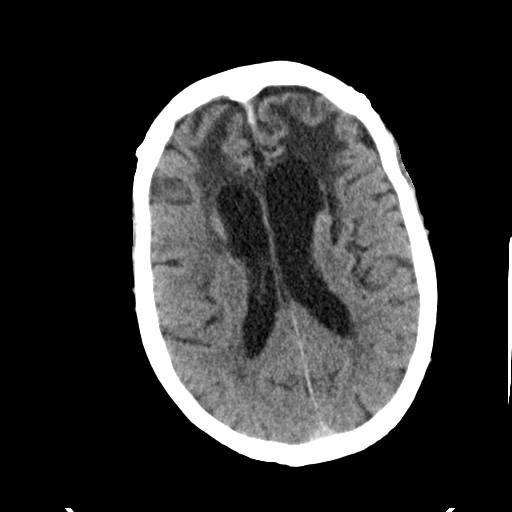
[im 18/33  bone]
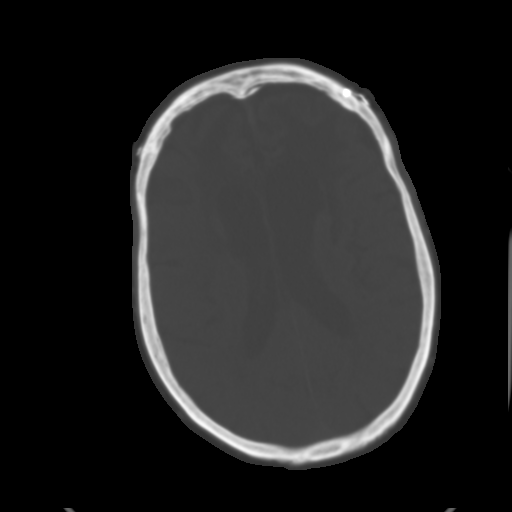
[im 23/33  brain]
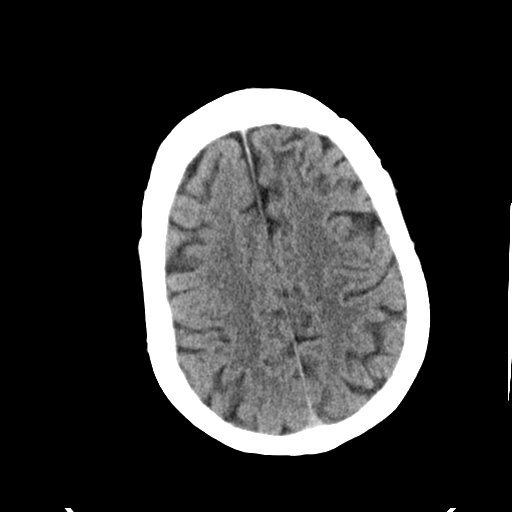
[im 26/33  brain]
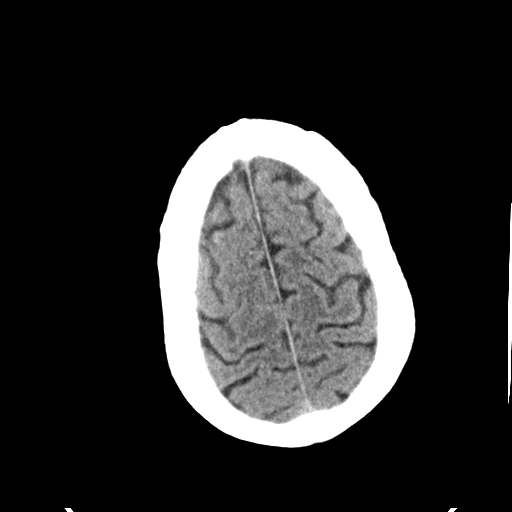
[im 30/33  brain]
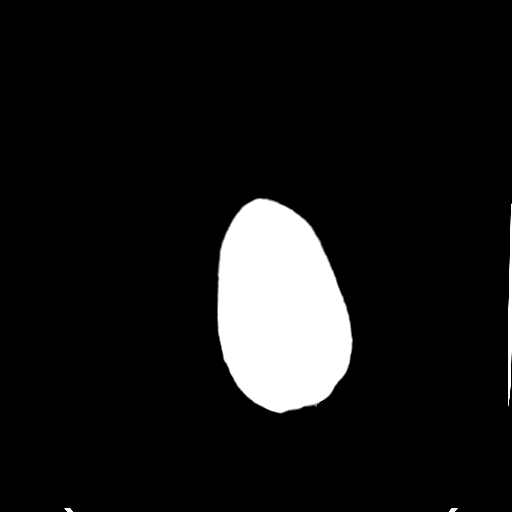

[Series 5: head 3.0 cor st · coronal · 0.32mm/px · 3 of 67 slices shown]
[im 23/67  brain]
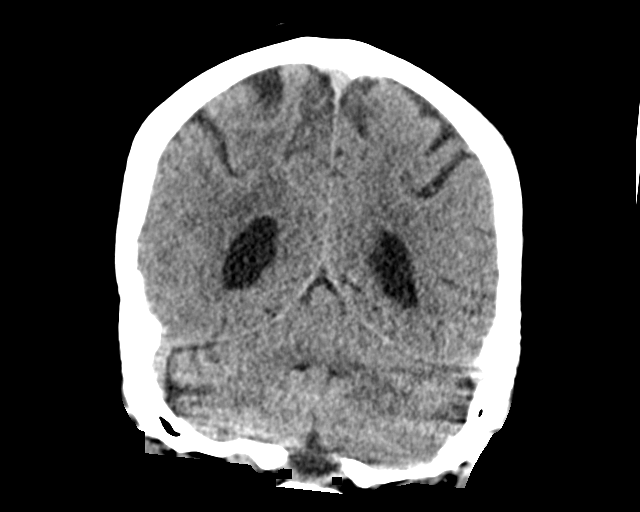
[im 30/67  brain]
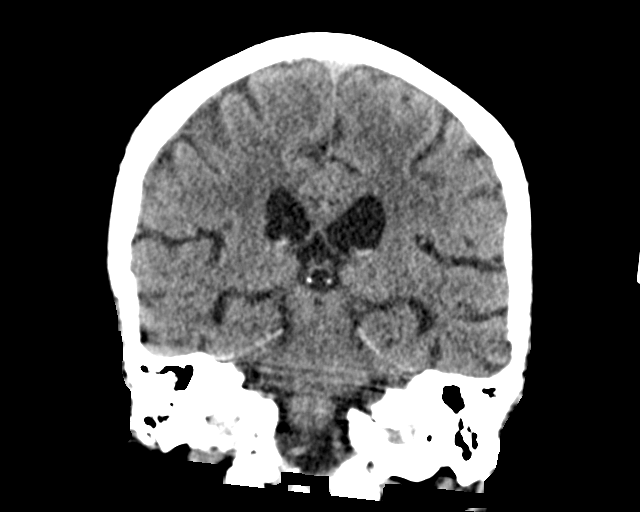
[im 37/67  brain]
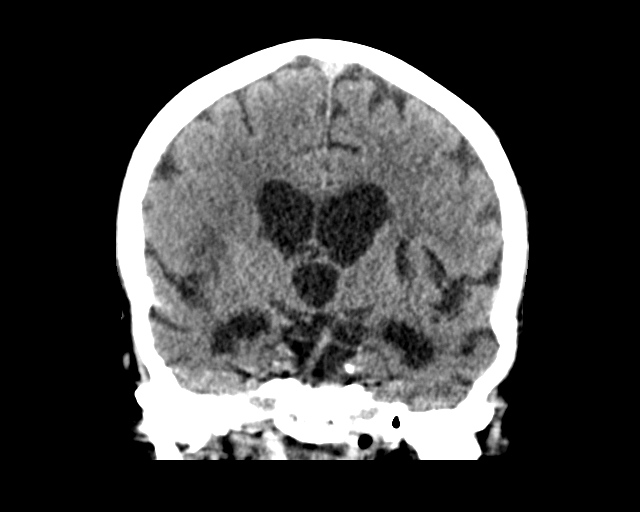

[Series 6: head 3.0 sag st · sagittal · 0.32mm/px · 3 of 61 slices shown]
[im 21/61  brain]
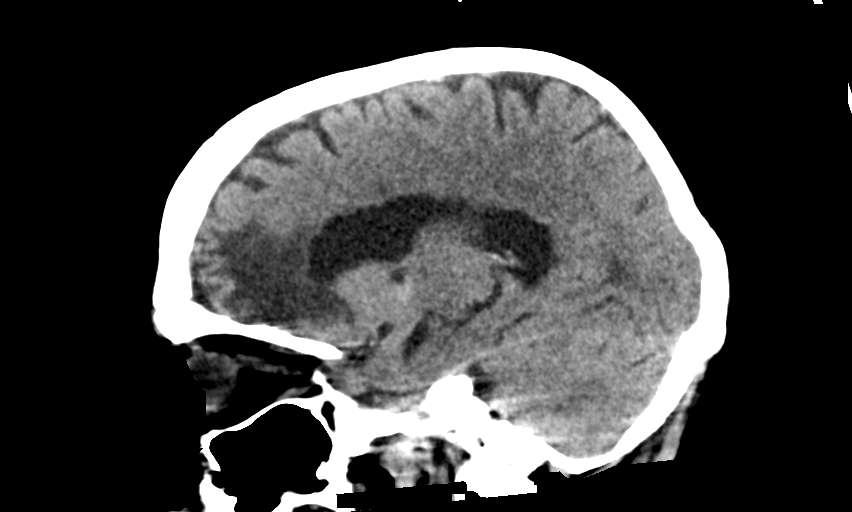
[im 31/61  brain]
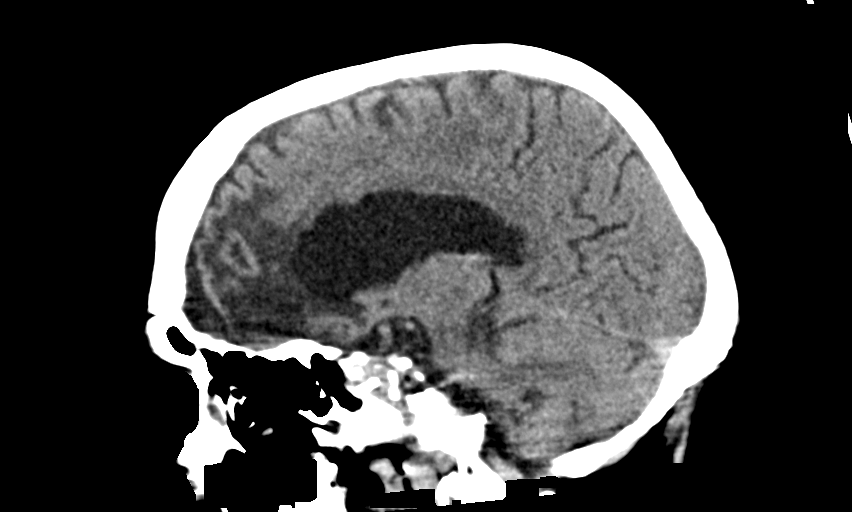
[im 41/61  brain]
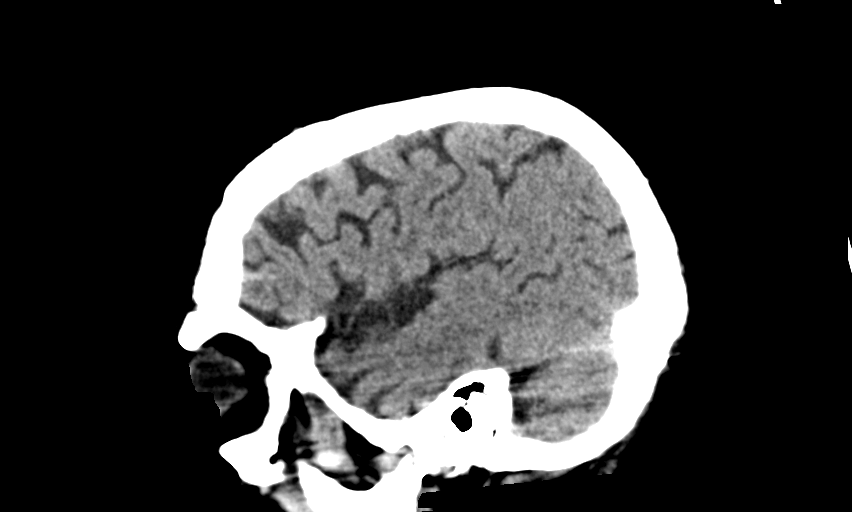

[14 of 47 positions shown; findings below may reference images not displayed]

FINDINGS: Brain: Fairly advanced chronic encephalomalacia in both anterior
frontal lobes. Ex vacuo enlargement of the frontal horns with mild
additional generalized ex vacuo appearing ventricular enlargement.

Superimposed abnormal hypodensity at the right insula and operculum
on series 2, image 16. But elsewhere gray-white matter
differentiation appears stable, with chronic encephalomalacia in the
right basal ganglia.

No acute intracranial hemorrhage identified. No midline shift, mass
effect, or evidence of intracranial mass lesion.

Vascular: Calcified atherosclerosis at the skull base. No suspicious
intracranial vascular hyperdensity.

Skull: Sequelae of bifrontal craniotomy, stable. No acute osseous
abnormality identified.

Sinuses/Orbits: Visualized paranasal sinuses and mastoids are clear.

Other: No definite gaze deviation, acute scalp soft tissue finding.

ASPECTS (Alberta Stroke Program Early CT Score)

Total score (0-10 with 10 being normal): 8 (acutely abnormal right
insula and M2 segment. Chronically abnormal right basal ganglia,
anterior frontal encephalomalacia).
IMPRESSION: 1. Positive for acute cortically based infarct of the right insula
and operculum. ASPECTS 8. No associated hemorrhage or mass effect.

2. Underlying chronic bifrontal encephalomalacia including chronic
involvement of the bilateral basal ganglia. Prior bifrontal
craniotomy.

3. These results were communicated to Dr. Pazolli at [DATE] on
10/01/2020 by text page via the [employer]ION messaging system.

## 2021-07-04 DIAGNOSIS — Z515 Encounter for palliative care: Secondary | ICD-10-CM | POA: Diagnosis not present

## 2021-07-04 DIAGNOSIS — I679 Cerebrovascular disease, unspecified: Secondary | ICD-10-CM | POA: Diagnosis not present

## 2021-07-04 DIAGNOSIS — L603 Nail dystrophy: Secondary | ICD-10-CM | POA: Diagnosis not present

## 2021-07-04 DIAGNOSIS — I69991 Dysphagia following unspecified cerebrovascular disease: Secondary | ICD-10-CM | POA: Diagnosis not present

## 2021-07-12 IMAGING — DX DG CHEST 1V PORT
1 series · 1 of 1 positions shown · non-contrast
Comparison: 10/18/2019

CLINICAL DATA: Cough, stroke

EXAM:
PORTABLE CHEST 1 VIEW

[chest]
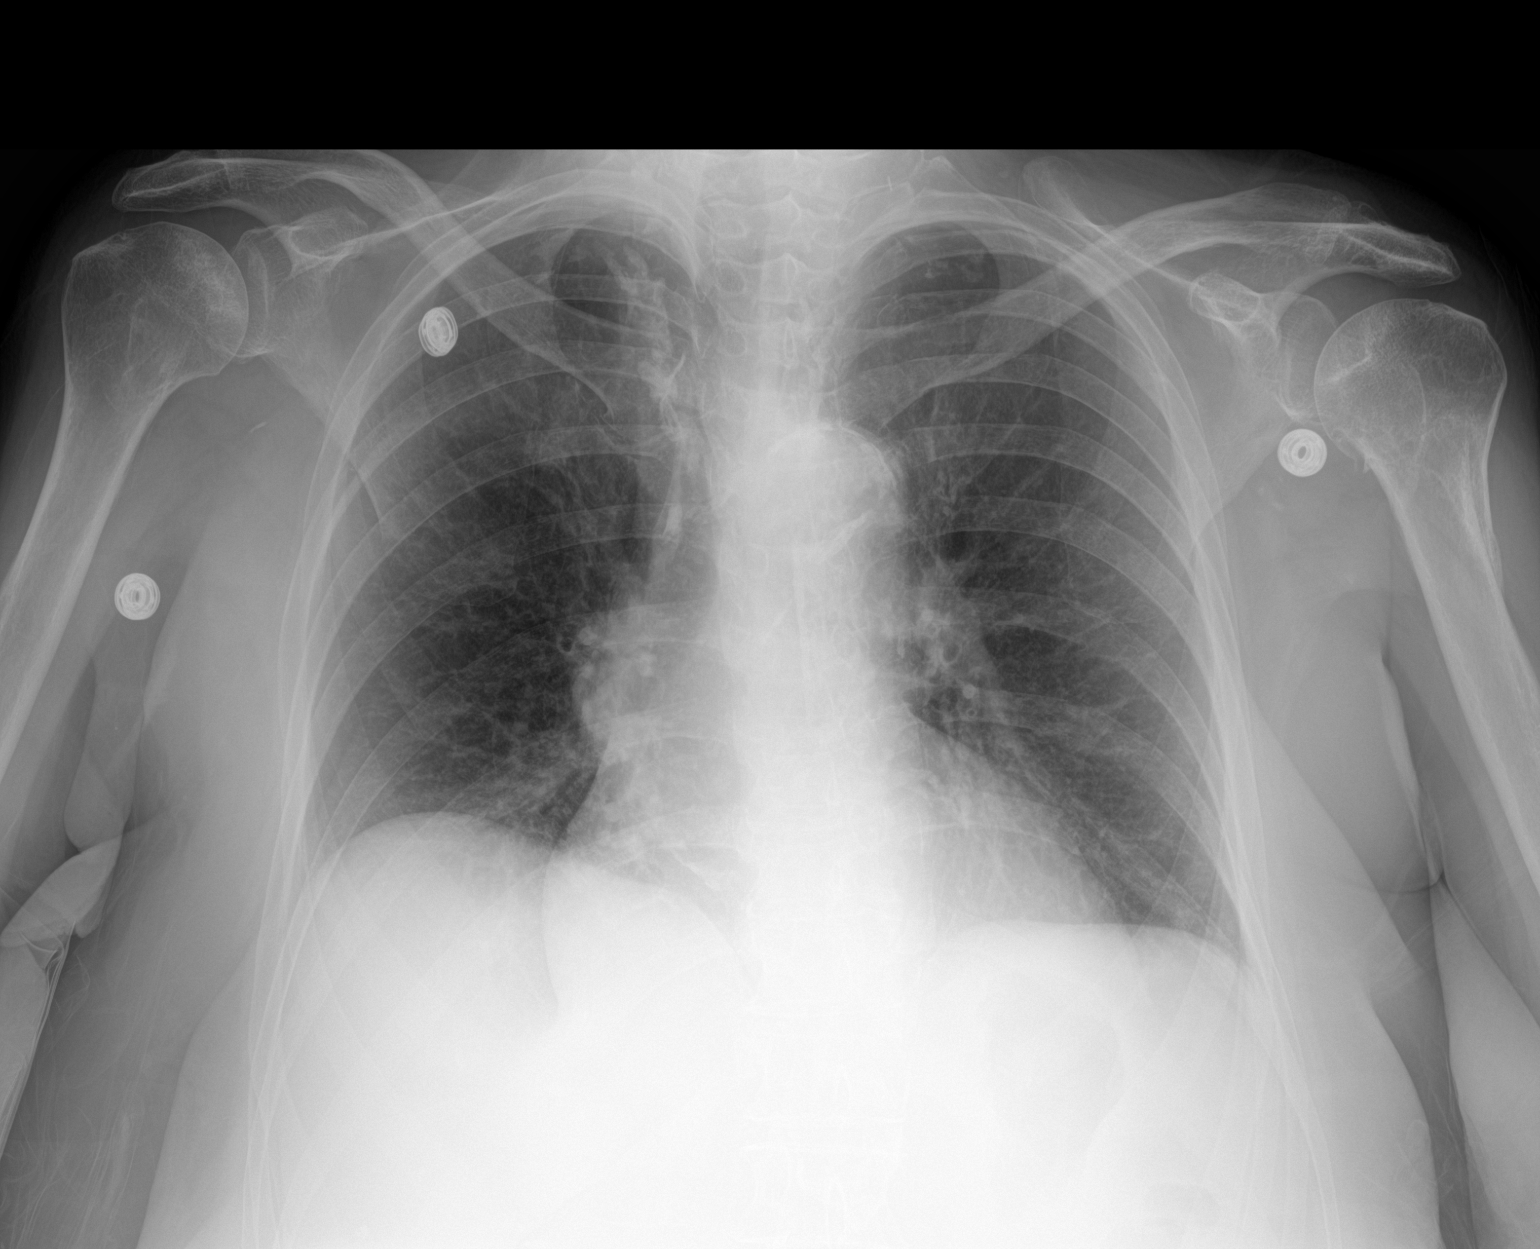

[1 of 1 positions shown; findings below may reference images not displayed]

FINDINGS: Heart is upper limits normal in size. Right base atelectasis. Aortic
atherosclerosis crash that no confluent opacity on the left. No
effusions. Aortic atherosclerosis. Biapical scarring.
IMPRESSION: Right basilar atelectasis.  Biapical scarring.

No active disease.

## 2022-01-19 DEATH — deceased
